# Patient Record
Sex: Male | Born: 1945 | Race: White | Hispanic: No | Marital: Married | Smoking: Former smoker
Health system: Southern US, Community
[De-identification: ages and names within clinical notes are randomized; demographics above are authoritative.]

## PROBLEM LIST (undated history)

## (undated) DIAGNOSIS — K219 Gastro-esophageal reflux disease without esophagitis: Secondary | ICD-10-CM

## (undated) DIAGNOSIS — K648 Other hemorrhoids: Secondary | ICD-10-CM

## (undated) DIAGNOSIS — K5790 Diverticulosis of intestine, part unspecified, without perforation or abscess without bleeding: Secondary | ICD-10-CM

## (undated) DIAGNOSIS — G500 Trigeminal neuralgia: Secondary | ICD-10-CM

## (undated) DIAGNOSIS — G4730 Sleep apnea, unspecified: Secondary | ICD-10-CM

## (undated) DIAGNOSIS — I10 Essential (primary) hypertension: Secondary | ICD-10-CM

## (undated) DIAGNOSIS — M1990 Unspecified osteoarthritis, unspecified site: Secondary | ICD-10-CM

## (undated) DIAGNOSIS — I252 Old myocardial infarction: Secondary | ICD-10-CM

## (undated) DIAGNOSIS — F32A Depression, unspecified: Secondary | ICD-10-CM

## (undated) DIAGNOSIS — F329 Major depressive disorder, single episode, unspecified: Secondary | ICD-10-CM

## (undated) DIAGNOSIS — C801 Malignant (primary) neoplasm, unspecified: Secondary | ICD-10-CM

## (undated) DIAGNOSIS — E785 Hyperlipidemia, unspecified: Secondary | ICD-10-CM

## (undated) HISTORY — DX: Other hemorrhoids: K64.8

## (undated) HISTORY — DX: Sleep apnea, unspecified: G47.30

## (undated) HISTORY — PX: JOINT REPLACEMENT: SHX530

## (undated) HISTORY — DX: Depression, unspecified: F32.A

## (undated) HISTORY — PX: KNEE SURGERY: SHX244

## (undated) HISTORY — PX: BUNIONECTOMY: SHX129

## (undated) HISTORY — DX: Hyperlipidemia, unspecified: E78.5

## (undated) HISTORY — DX: Major depressive disorder, single episode, unspecified: F32.9

## (undated) HISTORY — PX: EXTRACORPOREAL SHOCK WAVE LITHOTRIPSY: SHX1557

## (undated) HISTORY — DX: Trigeminal neuralgia: G50.0

## (undated) HISTORY — PX: VASECTOMY: SHX75

## (undated) HISTORY — DX: Diverticulosis of intestine, part unspecified, without perforation or abscess without bleeding: K57.90

## (undated) HISTORY — DX: Essential (primary) hypertension: I10

## (undated) HISTORY — DX: Unspecified osteoarthritis, unspecified site: M19.90

## (undated) HISTORY — PX: CORONARY STENT PLACEMENT: SHX1402

## (undated) HISTORY — PX: TOTAL SHOULDER REPLACEMENT: SUR1217

## (undated) HISTORY — PX: WRIST SURGERY: SHX841

## (undated) HISTORY — DX: Gastro-esophageal reflux disease without esophagitis: K21.9

## (undated) HISTORY — DX: Old myocardial infarction: I25.2

## (undated) HISTORY — PX: HERNIA REPAIR: SHX51

## (undated) HISTORY — PX: UPPER GASTROINTESTINAL ENDOSCOPY: SHX188

## (undated) HISTORY — PX: TONSILLECTOMY: SUR1361

---

## 2001-10-18 HISTORY — PX: UPPER GI ENDOSCOPY: SHX6162

## 2005-03-17 ENCOUNTER — Ambulatory Visit: Payer: Self-pay | Admitting: Podiatry

## 2008-12-14 ENCOUNTER — Ambulatory Visit: Payer: Self-pay | Admitting: Gastroenterology

## 2009-10-07 ENCOUNTER — Emergency Department: Payer: Self-pay | Admitting: Emergency Medicine

## 2009-10-16 ENCOUNTER — Ambulatory Visit: Payer: Self-pay | Admitting: Urology

## 2009-10-17 ENCOUNTER — Ambulatory Visit: Payer: Self-pay | Admitting: Urology

## 2009-10-29 ENCOUNTER — Ambulatory Visit: Payer: Self-pay | Admitting: Urology

## 2010-11-10 ENCOUNTER — Ambulatory Visit: Payer: Self-pay | Admitting: Family Medicine

## 2011-01-19 ENCOUNTER — Emergency Department: Payer: Self-pay | Admitting: Emergency Medicine

## 2011-05-26 ENCOUNTER — Ambulatory Visit: Payer: Self-pay | Admitting: Family Medicine

## 2011-10-14 ENCOUNTER — Ambulatory Visit: Payer: Self-pay | Admitting: Gastroenterology

## 2012-02-16 ENCOUNTER — Ambulatory Visit: Payer: Self-pay | Admitting: Urology

## 2012-12-12 ENCOUNTER — Ambulatory Visit: Payer: Self-pay | Admitting: Family Medicine

## 2012-12-12 LAB — CREATININE, SERUM
Creatinine: 1.18 mg/dL (ref 0.60–1.30)
EGFR (African American): >60
EGFR (Non-African Amer.): >60

## 2012-12-15 ENCOUNTER — Ambulatory Visit: Payer: Self-pay | Admitting: Family Medicine

## 2013-02-01 ENCOUNTER — Ambulatory Visit: Payer: Self-pay | Admitting: Vascular Surgery

## 2014-01-16 ENCOUNTER — Ambulatory Visit: Payer: Self-pay | Admitting: Urology

## 2014-01-16 DIAGNOSIS — N23 Unspecified renal colic: Secondary | ICD-10-CM | POA: Insufficient documentation

## 2014-01-16 DIAGNOSIS — N400 Enlarged prostate without lower urinary tract symptoms: Secondary | ICD-10-CM | POA: Insufficient documentation

## 2014-01-30 ENCOUNTER — Ambulatory Visit: Payer: Self-pay | Admitting: Family Medicine

## 2014-08-13 ENCOUNTER — Emergency Department: Payer: Self-pay | Admitting: Emergency Medicine

## 2014-08-13 DIAGNOSIS — I219 Acute myocardial infarction, unspecified: Secondary | ICD-10-CM | POA: Insufficient documentation

## 2014-08-21 DIAGNOSIS — E785 Hyperlipidemia, unspecified: Secondary | ICD-10-CM | POA: Insufficient documentation

## 2014-08-21 DIAGNOSIS — E782 Mixed hyperlipidemia: Secondary | ICD-10-CM | POA: Insufficient documentation

## 2014-08-21 DIAGNOSIS — I10 Essential (primary) hypertension: Secondary | ICD-10-CM | POA: Insufficient documentation

## 2014-09-28 LAB — BASIC METABOLIC PANEL
BUN: 19 mg/dL (ref 4–21)
Creatinine: 1 mg/dL (ref 0.6–1.3)
Glucose: 96 mg/dL
Potassium: 4.5 mmol/L (ref 3.4–5.3)
Sodium: 141 mmol/L (ref 137–147)

## 2014-09-28 LAB — CBC AND DIFFERENTIAL
HCT: 14 % — AB (ref 41–53)
HEMOGLOBIN: 41.9 g/dL — AB (ref 13.5–17.5)
NEUTROS ABS: 3 /uL
PLATELETS: 293 10*3/uL (ref 150–399)
WBC: 5.4 10^3/mL

## 2014-09-28 LAB — TSH: TSH: 2.03 u[IU]/mL (ref 0.41–5.90)

## 2014-09-28 LAB — HEPATIC FUNCTION PANEL
ALT: 23 U/L (ref 10–40)
AST: 19 U/L (ref 14–40)
Alkaline Phosphatase: 85 U/L (ref 25–125)

## 2014-09-28 LAB — LIPID PANEL
CHOLESTEROL: 98 mg/dL (ref 0–200)
HDL: 36 mg/dL (ref 35–70)
LDL Cholesterol: 45 mg/dL
LDL/HDL RATIO: 1.3
TRIGLYCERIDES: 87 mg/dL (ref 40–160)

## 2014-10-16 DIAGNOSIS — I2510 Atherosclerotic heart disease of native coronary artery without angina pectoris: Secondary | ICD-10-CM | POA: Insufficient documentation

## 2014-10-19 DIAGNOSIS — I219 Acute myocardial infarction, unspecified: Secondary | ICD-10-CM

## 2014-10-19 HISTORY — DX: Acute myocardial infarction, unspecified: I21.9

## 2014-10-22 ENCOUNTER — Ambulatory Visit: Payer: Self-pay | Admitting: Podiatry

## 2014-10-29 ENCOUNTER — Encounter: Payer: Self-pay | Admitting: Podiatry

## 2014-10-29 ENCOUNTER — Ambulatory Visit (INDEPENDENT_AMBULATORY_CARE_PROVIDER_SITE_OTHER): Payer: Commercial Managed Care - HMO

## 2014-10-29 ENCOUNTER — Ambulatory Visit (INDEPENDENT_AMBULATORY_CARE_PROVIDER_SITE_OTHER): Payer: Commercial Managed Care - HMO | Admitting: Podiatry

## 2014-10-29 VITALS — BP 119/72 | HR 71 | Resp 16 | Ht 71.0 in | Wt 165.0 lb

## 2014-10-29 DIAGNOSIS — M779 Enthesopathy, unspecified: Secondary | ICD-10-CM

## 2014-10-29 DIAGNOSIS — M2042 Other hammer toe(s) (acquired), left foot: Secondary | ICD-10-CM

## 2014-10-29 NOTE — Progress Notes (Signed)
   Subjective:    Patient ID: Timothy Singleton, male    DOB: August 25, 1946, 69 y.o.   MRN: [id number]  HPI Comments: The pain started a little bit after thanksgiving, the pain comes and goes.  It seems to show up after a lot of activity . Left foot ball , 2/3 met , tender if you mash on it   Foot Pain      Review of Systems  All other systems reviewed and are negative.      Objective:   Physical Exam: I have reviewed his past medical history medications allergies surgery social history and review of systems. Pulses are strongly palpable bilateral. Neurologic sensorium is intact per Semmes-Weinstein monofilament. Deep tendon reflexes are intact bilateral muscle strength +5 over 5 dorsiflexion plantar flexors and inverters everters all just musculature is intact. Orthopedic evaluation demonstrates all joints is the angle full range of motion without crepitation. Pain on palpation and in range of motion at the second metatarsophalangeal joint of the left foot slight enlargement of the joint radiographs confirm medial deviation of the second toe deformity hallux.        Assessment & Plan:  Assessment: Capsulitis metatarsophalangeal joint associated with hammertoe left foot.  Plan: Injected around the joint Kenalog and local anesthetic.

## 2014-12-10 ENCOUNTER — Ambulatory Visit (INDEPENDENT_AMBULATORY_CARE_PROVIDER_SITE_OTHER): Payer: Commercial Managed Care - HMO | Admitting: Podiatry

## 2014-12-10 ENCOUNTER — Encounter: Payer: Self-pay | Admitting: Podiatry

## 2014-12-10 VITALS — BP 106/68 | HR 67 | Resp 12

## 2014-12-10 DIAGNOSIS — M2042 Other hammer toe(s) (acquired), left foot: Secondary | ICD-10-CM

## 2014-12-10 DIAGNOSIS — M779 Enthesopathy, unspecified: Secondary | ICD-10-CM

## 2014-12-10 NOTE — Progress Notes (Signed)
He presents today for follow-up of capsulitis to second metatarsophalangeal joint of his left foot. He states that most recently he has been wearing a silicone hammertoe device that holds the toenail and has padding beneath the second metatarsal phalangeal joint. He states that this seems to be working much better  Objective: Pulses are palpable left foot. He still demonstrates mild hammertoe deformity with medial deviation of the second toe towards the hallux. This is indicative of dislocation syndrome however much decrease in edema and erythema surrounding the second metatarsophalangeal joint plantarly at the inflammation resolves.  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  Plan: I encouraged him to continue the use of his silicone hammertoe device and to notify me should his condition worsen.

## 2015-02-14 DIAGNOSIS — I6523 Occlusion and stenosis of bilateral carotid arteries: Secondary | ICD-10-CM | POA: Insufficient documentation

## 2015-03-20 ENCOUNTER — Telehealth: Payer: Self-pay

## 2015-03-20 NOTE — Telephone Encounter (Signed)
There is no history of Tramadol use that I cn see now--what is it for and why Tramadol?

## 2015-03-20 NOTE — Telephone Encounter (Signed)
Patient sent email requesting Tramodol 50mg   But he does not have this on his medication list.

## 2015-03-26 ENCOUNTER — Other Ambulatory Visit: Payer: Self-pay

## 2015-03-26 MED ORDER — TRAMADOL HCL 50 MG PO TABS: ORAL_TABLET | ORAL | Status: DC

## 2015-03-26 NOTE — Telephone Encounter (Signed)
He can have Tramadol 50mg  1-2 q 4 prn ,#200,5rf.

## 2015-04-29 ENCOUNTER — Other Ambulatory Visit: Payer: Self-pay | Admitting: Family Medicine

## 2015-04-29 ENCOUNTER — Other Ambulatory Visit: Payer: Self-pay

## 2015-04-29 MED ORDER — TAMSULOSIN HCL 0.4 MG PO CAPS: 0.4000 mg | ORAL_CAPSULE | Freq: Every day | ORAL | Status: DC

## 2015-07-10 ENCOUNTER — Encounter: Payer: Self-pay | Admitting: Family Medicine

## 2015-07-22 DIAGNOSIS — F329 Major depressive disorder, single episode, unspecified: Secondary | ICD-10-CM | POA: Insufficient documentation

## 2015-07-22 DIAGNOSIS — J3089 Other allergic rhinitis: Secondary | ICD-10-CM

## 2015-07-22 DIAGNOSIS — I2510 Atherosclerotic heart disease of native coronary artery without angina pectoris: Secondary | ICD-10-CM

## 2015-07-22 DIAGNOSIS — J309 Allergic rhinitis, unspecified: Secondary | ICD-10-CM | POA: Insufficient documentation

## 2015-07-22 DIAGNOSIS — K589 Irritable bowel syndrome without diarrhea: Secondary | ICD-10-CM

## 2015-07-22 DIAGNOSIS — K219 Gastro-esophageal reflux disease without esophagitis: Secondary | ICD-10-CM

## 2015-07-22 DIAGNOSIS — G459 Transient cerebral ischemic attack, unspecified: Secondary | ICD-10-CM | POA: Insufficient documentation

## 2015-07-22 DIAGNOSIS — G458 Other transient cerebral ischemic attacks and related syndromes: Secondary | ICD-10-CM

## 2015-07-22 DIAGNOSIS — L570 Actinic keratosis: Secondary | ICD-10-CM | POA: Insufficient documentation

## 2015-07-22 DIAGNOSIS — G500 Trigeminal neuralgia: Secondary | ICD-10-CM

## 2015-07-22 DIAGNOSIS — G4733 Obstructive sleep apnea (adult) (pediatric): Secondary | ICD-10-CM

## 2015-07-22 DIAGNOSIS — F32A Depression, unspecified: Secondary | ICD-10-CM | POA: Insufficient documentation

## 2015-07-23 ENCOUNTER — Encounter: Payer: Self-pay | Admitting: Family Medicine

## 2015-07-23 ENCOUNTER — Ambulatory Visit (INDEPENDENT_AMBULATORY_CARE_PROVIDER_SITE_OTHER): Payer: Commercial Managed Care - HMO | Admitting: Family Medicine

## 2015-07-23 VITALS — BP 112/72 | HR 68 | Temp 98.6°F | Resp 16 | Ht 71.5 in | Wt 165.0 lb

## 2015-07-23 DIAGNOSIS — Z0000 Encounter for general adult medical examination without abnormal findings: Secondary | ICD-10-CM | POA: Diagnosis not present

## 2015-07-23 DIAGNOSIS — L570 Actinic keratosis: Secondary | ICD-10-CM | POA: Diagnosis not present

## 2015-07-23 NOTE — Progress Notes (Signed)
Patient ID: Timothy Singleton, male   DOB: 1945/10/21, 69 y.o.   MRN: [id number] Patient: Timothy Singleton, Male    DOB: May 24, 1946, 69 y.o.   MRN: [id number] Visit Date: 07/23/2015  Today's Provider: Wilhemena Durie, MD   Chief Complaint  Patient presents with  . Annual Exam   Subjective:   Timothy Singleton is a 69 y.o. male who presents today for his Subsequent Annual Wellness Visit. He feels well. He reports exercising 3 times weekly. He reports he is sleeping well.  Review of Systems  Constitutional: Negative.   HENT: Negative.   Eyes: Negative.   Respiratory: Negative.   Cardiovascular: Negative.   Gastrointestinal: Negative.   Endocrine: Negative.   Genitourinary: Negative.   Musculoskeletal: Positive for arthralgias and neck stiffness. Negative for myalgias, back pain, joint swelling, gait problem and neck pain.  Allergic/Immunologic: Negative.   Neurological: Negative.   Hematological: Negative.   Psychiatric/Behavioral: Negative.     Patient Active Problem List   Diagnosis Date Noted  . Clinical depression 07/22/2015  . Acid reflux 07/22/2015  . IBS (irritable bowel syndrome) 07/22/2015  . AK (actinic keratosis) 07/22/2015  . TIA (transient ischemic attack) 07/22/2015  . OSA (obstructive sleep apnea) 07/22/2015  . Idiopathic trigeminal neuralgia 07/22/2015  . CAD in native artery 07/22/2015  . GERD (gastroesophageal reflux disease) 07/22/2015  . Allergic rhinitis 07/22/2015  . Arteriosclerosis of coronary artery 10/16/2014  . Essential (primary) hypertension 08/21/2014  . Combined fat and carbohydrate induced hyperlipemia 08/21/2014  . Heart attack ([place]) 08/13/2014  . Benign prostatic hypertrophy without urinary obstruction 01/16/2014  . Renal colic 96/22/2979    Social History   Social History  . Marital Status: Married    Spouse Name: N/A  . Number of Children: N/A  . Years of Education: N/A   Occupational History  . Not on file.   Social  History Main Topics  . Smoking status: Former Research scientist (life sciences)  . Smokeless tobacco: Not on file     Comment: quit 40 years ago   . Alcohol Use: 0.0 oz/week    0 Standard drinks or equivalent per week     Comment: 2-4 times per month  . Drug Use: Not on file  . Sexual Activity: Not on file   Other Topics Concern  . Not on file   Social History Narrative    Past Surgical History  Procedure Laterality Date  . Hernia repair      inguinal-right  . Vasectomy    . Knee surgery Right   . Tonsillectomy    . Bunionectomy    . Wrist surgery    . Upper gi endoscopy  10/18/01    hiatus hernia    His family history includes Breast cancer in his sister; Cancer in his mother; Dementia in his mother; Heart disease in his father; Hypertension in his brother and father; Melanoma in his maternal grandmother; Parkinson's disease in his brother; Stroke in his father.    Outpatient Prescriptions Prior to Visit  Medication Sig Dispense Refill  . aspirin 81 MG tablet Take 81 mg by mouth daily.    Marland Kitchen atorvastatin (LIPITOR) 80 MG tablet     . lisinopril (PRINIVIL,ZESTRIL) 10 MG tablet     . sertraline (ZOLOFT) 100 MG tablet Take by mouth.    . tamsulosin (FLOMAX) 0.4 MG CAPS capsule Take 1 capsule (0.4 mg total) by mouth daily. 30 capsule 2  . ticagrelor (BRILINTA) 90 MG TABS tablet Take by mouth.    Marland Kitchen  metoprolol succinate (TOPROL-XL) 25 MG 24 hr tablet     . traMADol (ULTRAM) 50 MG tablet 1 to 2 tablets every 4 hours as needed for pain 200 tablet 5   No facility-administered medications prior to visit.    No Known Allergies  Patient Care Team: Jerrol Banana., MD as PCP - General (Family Medicine)  Objective:   Vitals:  Filed Vitals:   07/23/15 1050  BP: 112/72  Pulse: 68  Temp: 98.6 F (37 C)  TempSrc: Oral  Resp: 16  Height: 5' 11.5" (1.816 m)  Weight: 165 lb (74.844 kg)    Physical Exam  Constitutional: He is oriented to person, place, and time. He appears well-developed and  well-nourished.  HENT:  Head: Normocephalic and atraumatic.  Right Ear: External ear normal.  Left Ear: External ear normal.  Nose: Nose normal.  Mouth/Throat: Oropharynx is clear and moist.  Eyes: Conjunctivae and EOM are normal. Pupils are equal, round, and reactive to light.  Neck: Normal range of motion. Neck supple.  Cardiovascular: Normal rate, regular rhythm, normal heart sounds and intact distal pulses.   Pulmonary/Chest: Effort normal and breath sounds normal.  Abdominal: Soft. Bowel sounds are normal.  Genitourinary: Rectum normal, prostate normal and penis normal.  Musculoskeletal: Normal range of motion.  Neurological: He is alert and oriented to person, place, and time.  Skin: Skin is warm and dry.  Very fair skin. Refer to dermatology  Psychiatric: He has a normal mood and affect. His behavior is normal. Judgment and thought content normal.    Activities of Daily Living In your present state of health, do you have any difficulty performing the following activities: 07/23/2015  Hearing? N  Vision? N  Difficulty concentrating or making decisions? N  Walking or climbing stairs? N  Dressing or bathing? N  Doing errands, shopping? N    Fall Risk Assessment Fall Risk  07/23/2015  Falls in the past year? No     Depression Screen PHQ 2/9 Scores 07/23/2015  PHQ - 2 Score 0    Cognitive Testing - 6-CIT    Year: 0 4 points  Month: 0 3 points  Memorize "T[place], [place], [address] [place]"  Time (within 1 hour:) 0 3 points  Count backwards from 20: 0 2 4 points  Name months of year: 0 2 4 points  Repeat Address: 0 2 4 6 8 10  points   Total Score:1 /28  Interpretation : Normal (0-7) Abnormal (8-28)    Assessment & Plan:     Annual Wellness Visit  Reviewed patient's Family Medical History Reviewed and updated list of patient's medical providers Assessment of cognitive impairment was done Assessed patient's functional ability Established a written schedule  for health screening [place] Completed and Reviewed  Exercise Activities and Dietary recommendations Goals    None      Immunization History  Administered Date(s) Administered  . Pneumococcal Conjugate-13 07/11/2014  . Td 09/20/2003  . Zoster 01/28/2010    Health Maintenance  Topic Date Due  . Hepatitis C Screening  02-May-1946  . COLONOSCOPY  05/21/1995  . TETANUS/TDAP  09/19/2013  . INFLUENZA VACCINE  05/20/2015  . PNA vac Low Risk Adult (2 of 2 - PPSV23) 07/12/2015  . ZOSTAVAX  Completed      Discussed health benefits of physical activity, and encouraged him to engage in regular exercise appropriate for his age and condition.    Miguel Aschoff MD [facility]  07/23/2015 10:55 AM  ------------------------------------------------------------------------------------------------------------

## 2015-07-26 ENCOUNTER — Encounter: Payer: Self-pay | Admitting: Family Medicine

## 2015-08-28 ENCOUNTER — Ambulatory Visit: Payer: Self-pay | Admitting: Family Medicine

## 2015-09-10 DIAGNOSIS — M19019 Primary osteoarthritis, unspecified shoulder: Secondary | ICD-10-CM | POA: Insufficient documentation

## 2015-09-10 LAB — BASIC METABOLIC PANEL
BUN: 14 mg/dL (ref 4–21)
Creatinine: 0.9 mg/dL (ref 0.6–1.3)
Glucose: 123 mg/dL
Potassium: 4.8 mmol/L (ref 3.4–5.3)
SODIUM: 143 mmol/L (ref 137–147)

## 2015-09-10 LAB — CBC AND DIFFERENTIAL
HEMATOCRIT: 35 % — AB (ref 41–53)
HEMOGLOBIN: 11.7 g/dL — AB (ref 13.5–17.5)
NEUTROS ABS: 2 /uL
Platelets: 215 10*3/uL (ref 150–399)
WBC: 7.3 10^3/mL

## 2015-10-24 DIAGNOSIS — M25611 Stiffness of right shoulder, not elsewhere classified: Secondary | ICD-10-CM | POA: Diagnosis not present

## 2015-10-24 DIAGNOSIS — M25511 Pain in right shoulder: Secondary | ICD-10-CM | POA: Diagnosis not present

## 2015-10-29 DIAGNOSIS — M25611 Stiffness of right shoulder, not elsewhere classified: Secondary | ICD-10-CM | POA: Diagnosis not present

## 2015-10-29 DIAGNOSIS — M25511 Pain in right shoulder: Secondary | ICD-10-CM | POA: Diagnosis not present

## 2015-11-05 DIAGNOSIS — M25611 Stiffness of right shoulder, not elsewhere classified: Secondary | ICD-10-CM | POA: Diagnosis not present

## 2015-11-05 DIAGNOSIS — M25511 Pain in right shoulder: Secondary | ICD-10-CM | POA: Diagnosis not present

## 2015-11-06 DIAGNOSIS — M25561 Pain in right knee: Secondary | ICD-10-CM | POA: Diagnosis not present

## 2015-11-08 DIAGNOSIS — M25511 Pain in right shoulder: Secondary | ICD-10-CM | POA: Diagnosis not present

## 2015-11-08 DIAGNOSIS — M25611 Stiffness of right shoulder, not elsewhere classified: Secondary | ICD-10-CM | POA: Diagnosis not present

## 2015-11-12 DIAGNOSIS — M25611 Stiffness of right shoulder, not elsewhere classified: Secondary | ICD-10-CM | POA: Diagnosis not present

## 2015-11-12 DIAGNOSIS — M25511 Pain in right shoulder: Secondary | ICD-10-CM | POA: Diagnosis not present

## 2015-11-14 DIAGNOSIS — M25511 Pain in right shoulder: Secondary | ICD-10-CM | POA: Diagnosis not present

## 2015-11-14 DIAGNOSIS — M25611 Stiffness of right shoulder, not elsewhere classified: Secondary | ICD-10-CM | POA: Diagnosis not present

## 2015-11-19 DIAGNOSIS — M25611 Stiffness of right shoulder, not elsewhere classified: Secondary | ICD-10-CM | POA: Diagnosis not present

## 2015-11-19 DIAGNOSIS — M25511 Pain in right shoulder: Secondary | ICD-10-CM | POA: Diagnosis not present

## 2015-11-21 DIAGNOSIS — M25511 Pain in right shoulder: Secondary | ICD-10-CM | POA: Diagnosis not present

## 2015-11-21 DIAGNOSIS — M25611 Stiffness of right shoulder, not elsewhere classified: Secondary | ICD-10-CM | POA: Diagnosis not present

## 2015-11-25 DIAGNOSIS — E782 Mixed hyperlipidemia: Secondary | ICD-10-CM | POA: Diagnosis not present

## 2015-11-25 DIAGNOSIS — I10 Essential (primary) hypertension: Secondary | ICD-10-CM | POA: Diagnosis not present

## 2015-11-25 DIAGNOSIS — I2510 Atherosclerotic heart disease of native coronary artery without angina pectoris: Secondary | ICD-10-CM | POA: Diagnosis not present

## 2015-11-26 DIAGNOSIS — M25611 Stiffness of right shoulder, not elsewhere classified: Secondary | ICD-10-CM | POA: Diagnosis not present

## 2015-11-26 DIAGNOSIS — M25511 Pain in right shoulder: Secondary | ICD-10-CM | POA: Diagnosis not present

## 2015-11-28 DIAGNOSIS — M25511 Pain in right shoulder: Secondary | ICD-10-CM | POA: Diagnosis not present

## 2015-11-28 DIAGNOSIS — M25611 Stiffness of right shoulder, not elsewhere classified: Secondary | ICD-10-CM | POA: Diagnosis not present

## 2015-11-29 DIAGNOSIS — H01021 Squamous blepharitis right upper eyelid: Secondary | ICD-10-CM | POA: Diagnosis not present

## 2015-11-29 DIAGNOSIS — H16221 Keratoconjunctivitis sicca, not specified as Sjogren's, right eye: Secondary | ICD-10-CM | POA: Diagnosis not present

## 2015-12-10 ENCOUNTER — Ambulatory Visit (INDEPENDENT_AMBULATORY_CARE_PROVIDER_SITE_OTHER): Payer: PPO | Admitting: Family Medicine

## 2015-12-10 VITALS — BP 110/60 | HR 64 | Temp 97.9°F | Resp 16 | Ht 71.0 in | Wt 162.0 lb

## 2015-12-10 DIAGNOSIS — G4733 Obstructive sleep apnea (adult) (pediatric): Secondary | ICD-10-CM

## 2015-12-10 DIAGNOSIS — Z01818 Encounter for other preprocedural examination: Secondary | ICD-10-CM

## 2015-12-10 NOTE — Progress Notes (Signed)
Patient ID: Timothy Singleton, male   DOB: 10-Mar-1946, 70 y.o.   MRN: [id number]   Zayquan Ehrler  MRN: [id number] DOB: 01-18-1946  Subjective:  HPI   1. Pre-op evaluation The patient is a 70 year old male who presents for surgical clearance.  He is scheduled for December 24, 2015 at [facility] to have Left shoulder replacement by Dr. Ivin Booty. He has had anesthesia before and reports no adverse effects.  He is currently on Aspirin 81 mg and has plans to discontinue 1 week prior to the surgery.  He has history of stent placement about 1 year ago, without current complications.  He has no history of any diabetes, lung disease bleeding or clotting disorder. He is a former smoker who quit in 1976.  He drinks alcoholic beverages on average 2 per week.  He has been instructed not to have any alcoholic beverages within 2 days of the surgery. His post op care is planned and he will be going home and will be under the care of his family.  Patient Active Problem List   Diagnosis Date Noted  . Clinical depression 07/22/2015  . Acid reflux 07/22/2015  . IBS (irritable bowel syndrome) 07/22/2015  . AK (actinic keratosis) 07/22/2015  . TIA (transient ischemic attack) 07/22/2015  . OSA (obstructive sleep apnea) 07/22/2015  . Idiopathic trigeminal neuralgia 07/22/2015  . CAD in native artery 07/22/2015  . GERD (gastroesophageal reflux disease) 07/22/2015  . Allergic rhinitis 07/22/2015  . Arteriosclerosis of coronary artery 10/16/2014  . Essential (primary) hypertension 08/21/2014  . Combined fat and carbohydrate induced hyperlipemia 08/21/2014  . Heart attack ([place]) 08/13/2014  . Benign prostatic hypertrophy without urinary obstruction 01/16/2014  . Renal colic 0000000    Past Medical History  Diagnosis Date  . Hypertension   . Past heart attack   . Depression     Social History   Social History  . Marital Status: Married    Spouse Name: N/A  . Number of Children: N/A  .  Years of Education: N/A   Occupational History  . Not on file.   Social History Main Topics  . Smoking status: Former Research scientist (life sciences)  . Smokeless tobacco: Not on file     Comment: quit 40 years ago   . Alcohol Use: 0.0 oz/week    0 Standard drinks or equivalent per week     Comment: 2-4 times per month  . Drug Use: Not on file  . Sexual Activity: Not on file   Other Topics Concern  . Not on file   Social History Narrative    Outpatient Prescriptions Prior to Visit  Medication Sig Dispense Refill  . aspirin 81 MG tablet Take 81 mg by mouth daily.    Marland Kitchen atorvastatin (LIPITOR) 80 MG tablet     . lisinopril (PRINIVIL,ZESTRIL) 10 MG tablet     . sertraline (ZOLOFT) 100 MG tablet Take 100 mg by mouth.     . tamsulosin (FLOMAX) 0.4 MG CAPS capsule Take 1 capsule (0.4 mg total) by mouth daily. 30 capsule 2  . ticagrelor (BRILINTA) 90 MG TABS tablet Take by mouth.     No facility-administered medications prior to visit.   Outpatient Encounter Prescriptions as of 12/10/2015  Medication Sig Note  . aspirin 81 MG tablet Take 81 mg by mouth daily.   Marland Kitchen atorvastatin (LIPITOR) 80 MG tablet  10/29/2014: Received from: External Pharmacy  . lisinopril (PRINIVIL,ZESTRIL) 10 MG tablet  10/29/2014: Received from: External Pharmacy  . sertraline (  ZOLOFT) 100 MG tablet Take 100 mg by mouth.  10/29/2014: Received from: [facility]  . tamsulosin (FLOMAX) 0.4 MG CAPS capsule Take 1 capsule (0.4 mg total) by mouth daily.   . [DISCONTINUED] ticagrelor (BRILINTA) 90 MG TABS tablet Take by mouth. 10/29/2014: Received from: [facility]   No facility-administered encounter medications on file as of 12/10/2015.   No Known Allergies  Review of Systems  Constitutional: Negative.   HENT: Negative.   Eyes: Negative.   Respiratory: Negative.   Cardiovascular: Negative.   Gastrointestinal: Negative.   Genitourinary: Negative.   Musculoskeletal: Positive for joint pain. Negative for  myalgias, back pain, falls and neck pain.  Skin: Negative.   Neurological: Negative.   Endo/Heme/Allergies: Negative.   Psychiatric/Behavioral: Negative.    Objective:  BP 110/60 mmHg  Pulse 64  Temp(Src) 97.9 F (36.6 C) (Oral)  Resp 16  Ht 5\' 11"  (1.803 m)  Wt 162 lb (73.483 kg)  BMI 22.60 kg/m2  Physical Exam  Constitutional: He is oriented to person, place, and time and well-developed, well-nourished, and in no distress.  HENT:  Head: Normocephalic and atraumatic.  Right Ear: External ear normal.  Left Ear: External ear normal.  Nose: Nose normal.  Mouth/Throat: Oropharynx is clear and moist.  Eyes: Conjunctivae and EOM are normal. Pupils are equal, round, and reactive to light.  Neck: Normal range of motion. Neck supple.  Cardiovascular: Normal rate, regular rhythm, normal heart sounds and intact distal pulses.   Pulmonary/Chest: Effort normal and breath sounds normal.  Abdominal: Soft. Bowel sounds are normal.  Musculoskeletal: Normal range of motion.  Neurological: He is alert and oriented to person, place, and time. He has normal reflexes. Gait normal. GCS score is 15.  Skin: Skin is warm and dry.  Very fair skin.  Psychiatric: Mood, memory, affect and judgment normal.    Assessment and Plan :  Pre-op evaluation - Plan: EKG 12-Lead Cleared for surgery for shoulder. - EKG 12-Lead Cardiac Clearance through Dr. Nehemiah Massed. 2. OSA (obstructive sleep apnea) Scored 11 on Epworth sleepiness scale today. Recheck patient in 2-3 months after surgery to see how he is doing after his shoulder pain has resolved. Maybe he will be sleeping better then. If not will restart CPAP 3. CAD 4. Hypertension 5. Hyperlipidemia I have done the exam and reviewed the above chart and it is accurate to the best of my knowledge.   Patient was seen and examined by Dr. Miguel Aschoff, and noted scribed by Webb Laws, [place] MD [facility] 12/10/2015 11:44 AM

## 2015-12-13 DIAGNOSIS — M67412 Ganglion, left shoulder: Secondary | ICD-10-CM | POA: Diagnosis not present

## 2015-12-13 DIAGNOSIS — M25412 Effusion, left shoulder: Secondary | ICD-10-CM | POA: Diagnosis not present

## 2015-12-13 DIAGNOSIS — I252 Old myocardial infarction: Secondary | ICD-10-CM | POA: Diagnosis not present

## 2015-12-13 DIAGNOSIS — Z01818 Encounter for other preprocedural examination: Secondary | ICD-10-CM | POA: Diagnosis not present

## 2015-12-13 DIAGNOSIS — Z87891 Personal history of nicotine dependence: Secondary | ICD-10-CM | POA: Diagnosis not present

## 2015-12-13 DIAGNOSIS — Z01812 Encounter for preprocedural laboratory examination: Secondary | ICD-10-CM | POA: Diagnosis not present

## 2015-12-13 DIAGNOSIS — I10 Essential (primary) hypertension: Secondary | ICD-10-CM | POA: Diagnosis not present

## 2015-12-13 DIAGNOSIS — M19011 Primary osteoarthritis, right shoulder: Secondary | ICD-10-CM | POA: Diagnosis not present

## 2015-12-13 DIAGNOSIS — Z951 Presence of aortocoronary bypass graft: Secondary | ICD-10-CM | POA: Diagnosis not present

## 2015-12-13 DIAGNOSIS — M19012 Primary osteoarthritis, left shoulder: Secondary | ICD-10-CM | POA: Diagnosis not present

## 2015-12-17 ENCOUNTER — Ambulatory Visit: Payer: Self-pay | Admitting: Family Medicine

## 2015-12-24 DIAGNOSIS — N400 Enlarged prostate without lower urinary tract symptoms: Secondary | ICD-10-CM | POA: Diagnosis not present

## 2015-12-24 DIAGNOSIS — F329 Major depressive disorder, single episode, unspecified: Secondary | ICD-10-CM | POA: Diagnosis not present

## 2015-12-24 DIAGNOSIS — Z955 Presence of coronary angioplasty implant and graft: Secondary | ICD-10-CM | POA: Diagnosis not present

## 2015-12-24 DIAGNOSIS — M75102 Unspecified rotator cuff tear or rupture of left shoulder, not specified as traumatic: Secondary | ICD-10-CM | POA: Diagnosis not present

## 2015-12-24 DIAGNOSIS — I2510 Atherosclerotic heart disease of native coronary artery without angina pectoris: Secondary | ICD-10-CM | POA: Diagnosis not present

## 2015-12-24 DIAGNOSIS — Z87891 Personal history of nicotine dependence: Secondary | ICD-10-CM | POA: Diagnosis not present

## 2015-12-24 DIAGNOSIS — M1990 Unspecified osteoarthritis, unspecified site: Secondary | ICD-10-CM | POA: Diagnosis not present

## 2015-12-24 DIAGNOSIS — Z96612 Presence of left artificial shoulder joint: Secondary | ICD-10-CM | POA: Diagnosis not present

## 2015-12-24 DIAGNOSIS — K219 Gastro-esophageal reflux disease without esophagitis: Secondary | ICD-10-CM | POA: Diagnosis not present

## 2015-12-24 DIAGNOSIS — G629 Polyneuropathy, unspecified: Secondary | ICD-10-CM | POA: Diagnosis not present

## 2015-12-24 DIAGNOSIS — Z471 Aftercare following joint replacement surgery: Secondary | ICD-10-CM | POA: Diagnosis not present

## 2015-12-24 DIAGNOSIS — I10 Essential (primary) hypertension: Secondary | ICD-10-CM | POA: Diagnosis not present

## 2015-12-24 DIAGNOSIS — E785 Hyperlipidemia, unspecified: Secondary | ICD-10-CM | POA: Diagnosis not present

## 2015-12-24 DIAGNOSIS — I252 Old myocardial infarction: Secondary | ICD-10-CM | POA: Diagnosis not present

## 2015-12-24 DIAGNOSIS — M12812 Other specific arthropathies, not elsewhere classified, left shoulder: Secondary | ICD-10-CM | POA: Diagnosis not present

## 2015-12-24 DIAGNOSIS — G8918 Other acute postprocedural pain: Secondary | ICD-10-CM | POA: Diagnosis not present

## 2015-12-25 DIAGNOSIS — G8918 Other acute postprocedural pain: Secondary | ICD-10-CM | POA: Diagnosis not present

## 2015-12-25 DIAGNOSIS — M75102 Unspecified rotator cuff tear or rupture of left shoulder, not specified as traumatic: Secondary | ICD-10-CM | POA: Diagnosis not present

## 2016-01-07 ENCOUNTER — Encounter: Payer: Self-pay | Admitting: Family Medicine

## 2016-01-07 DIAGNOSIS — M25512 Pain in left shoulder: Secondary | ICD-10-CM | POA: Diagnosis not present

## 2016-01-08 ENCOUNTER — Other Ambulatory Visit: Payer: Self-pay | Admitting: Emergency Medicine

## 2016-01-08 MED ORDER — CARBAMAZEPINE ER 200 MG PO CP12
200.0000 mg | ORAL_CAPSULE | Freq: Two times a day (BID) | ORAL | Status: DC
Start: 2016-01-08 — End: 2016-10-28

## 2016-01-16 DIAGNOSIS — M25612 Stiffness of left shoulder, not elsewhere classified: Secondary | ICD-10-CM | POA: Diagnosis not present

## 2016-01-16 DIAGNOSIS — M25512 Pain in left shoulder: Secondary | ICD-10-CM | POA: Diagnosis not present

## 2016-01-21 ENCOUNTER — Telehealth: Payer: Self-pay

## 2016-01-21 ENCOUNTER — Encounter: Payer: Self-pay | Admitting: Family Medicine

## 2016-01-21 DIAGNOSIS — M25612 Stiffness of left shoulder, not elsewhere classified: Secondary | ICD-10-CM | POA: Diagnosis not present

## 2016-01-21 DIAGNOSIS — M25512 Pain in left shoulder: Secondary | ICD-10-CM | POA: Diagnosis not present

## 2016-01-21 MED ORDER — ATORVASTATIN CALCIUM 80 MG PO TABS: 80.0000 mg | ORAL_TABLET | Freq: Every day | ORAL | Status: DC

## 2016-01-21 MED ORDER — TRAMADOL HCL 50 MG PO TABS: 50.0000 mg | ORAL_TABLET | ORAL | Status: DC | PRN

## 2016-01-21 MED ORDER — TAMSULOSIN HCL 0.4 MG PO CAPS: 0.4000 mg | ORAL_CAPSULE | Freq: Every day | ORAL | Status: DC

## 2016-01-21 MED ORDER — SERTRALINE HCL 100 MG PO TABS: 100.0000 mg | ORAL_TABLET | Freq: Every day | ORAL | Status: DC

## 2016-01-21 NOTE — Telephone Encounter (Signed)
rx filled-aa

## 2016-01-21 NOTE — Telephone Encounter (Signed)
Tramadol 50 mg every 4 hours when necessary #100,1refill

## 2016-01-21 NOTE — Telephone Encounter (Signed)
I am  out of the Tramadol that the neurologist had me on for the first line of defense against the Trigeminal neuralgia. I can't find my old bottle so I'm not sure what the dosage was but I'd like to have that medicine to keep on hand.-aa

## 2016-01-23 DIAGNOSIS — M25612 Stiffness of left shoulder, not elsewhere classified: Secondary | ICD-10-CM | POA: Diagnosis not present

## 2016-01-23 DIAGNOSIS — M25512 Pain in left shoulder: Secondary | ICD-10-CM | POA: Diagnosis not present

## 2016-01-28 DIAGNOSIS — M25512 Pain in left shoulder: Secondary | ICD-10-CM | POA: Diagnosis not present

## 2016-01-28 DIAGNOSIS — M25612 Stiffness of left shoulder, not elsewhere classified: Secondary | ICD-10-CM | POA: Diagnosis not present

## 2016-01-30 DIAGNOSIS — M6281 Muscle weakness (generalized): Secondary | ICD-10-CM | POA: Diagnosis not present

## 2016-02-04 DIAGNOSIS — M25512 Pain in left shoulder: Secondary | ICD-10-CM | POA: Diagnosis not present

## 2016-02-04 DIAGNOSIS — M25612 Stiffness of left shoulder, not elsewhere classified: Secondary | ICD-10-CM | POA: Diagnosis not present

## 2016-02-06 DIAGNOSIS — M25512 Pain in left shoulder: Secondary | ICD-10-CM | POA: Diagnosis not present

## 2016-02-06 DIAGNOSIS — M25612 Stiffness of left shoulder, not elsewhere classified: Secondary | ICD-10-CM | POA: Diagnosis not present

## 2016-02-11 DIAGNOSIS — L570 Actinic keratosis: Secondary | ICD-10-CM | POA: Diagnosis not present

## 2016-02-11 DIAGNOSIS — L814 Other melanin hyperpigmentation: Secondary | ICD-10-CM | POA: Diagnosis not present

## 2016-02-11 DIAGNOSIS — D485 Neoplasm of uncertain behavior of skin: Secondary | ICD-10-CM | POA: Diagnosis not present

## 2016-02-11 DIAGNOSIS — D225 Melanocytic nevi of trunk: Secondary | ICD-10-CM | POA: Diagnosis not present

## 2016-02-11 DIAGNOSIS — M25612 Stiffness of left shoulder, not elsewhere classified: Secondary | ICD-10-CM | POA: Diagnosis not present

## 2016-02-11 DIAGNOSIS — D2272 Melanocytic nevi of left lower limb, including hip: Secondary | ICD-10-CM | POA: Diagnosis not present

## 2016-02-11 DIAGNOSIS — M25512 Pain in left shoulder: Secondary | ICD-10-CM | POA: Diagnosis not present

## 2016-02-11 DIAGNOSIS — C4371 Malignant melanoma of right lower limb, including hip: Secondary | ICD-10-CM | POA: Diagnosis not present

## 2016-02-11 DIAGNOSIS — X32XXXA Exposure to sunlight, initial encounter: Secondary | ICD-10-CM | POA: Diagnosis not present

## 2016-02-13 DIAGNOSIS — M25612 Stiffness of left shoulder, not elsewhere classified: Secondary | ICD-10-CM | POA: Diagnosis not present

## 2016-02-13 DIAGNOSIS — M25512 Pain in left shoulder: Secondary | ICD-10-CM | POA: Diagnosis not present

## 2016-02-17 DIAGNOSIS — D0371 Melanoma in situ of right lower limb, including hip: Secondary | ICD-10-CM | POA: Diagnosis not present

## 2016-02-17 DIAGNOSIS — C4371 Malignant melanoma of right lower limb, including hip: Secondary | ICD-10-CM | POA: Diagnosis not present

## 2016-02-18 DIAGNOSIS — M25612 Stiffness of left shoulder, not elsewhere classified: Secondary | ICD-10-CM | POA: Diagnosis not present

## 2016-02-18 DIAGNOSIS — M25512 Pain in left shoulder: Secondary | ICD-10-CM | POA: Diagnosis not present

## 2016-02-20 ENCOUNTER — Encounter: Payer: Self-pay | Admitting: Family Medicine

## 2016-02-20 ENCOUNTER — Telehealth: Payer: Self-pay

## 2016-02-20 DIAGNOSIS — M25612 Stiffness of left shoulder, not elsewhere classified: Secondary | ICD-10-CM | POA: Diagnosis not present

## 2016-02-20 DIAGNOSIS — M25512 Pain in left shoulder: Secondary | ICD-10-CM | POA: Diagnosis not present

## 2016-02-20 NOTE — Telephone Encounter (Signed)
Dick,        A couple of things. First, I saw Dr. Evorn Gong last week and he did an exam of my body and removed several spots. 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        I have an appointment scheduled with you for May 30th; we were going to talk about my not sleeping well at night and revisit the CPAP, which I don't use.        However, I am experiencing some tenderness in my left breast. I don't feel a lump and Dr. Evorn Gong said he didn't see any difference in my left and right breast but suggested that I see you.        I'm sending this note at Vicki's suggestion, in case there is something that you would like to do before my May 30th appointment.        Thanks,        Timothy Singleton

## 2016-02-25 DIAGNOSIS — M25612 Stiffness of left shoulder, not elsewhere classified: Secondary | ICD-10-CM | POA: Diagnosis not present

## 2016-02-25 DIAGNOSIS — M25512 Pain in left shoulder: Secondary | ICD-10-CM | POA: Diagnosis not present

## 2016-03-05 DIAGNOSIS — M25512 Pain in left shoulder: Secondary | ICD-10-CM | POA: Diagnosis not present

## 2016-03-05 DIAGNOSIS — M25612 Stiffness of left shoulder, not elsewhere classified: Secondary | ICD-10-CM | POA: Diagnosis not present

## 2016-03-10 DIAGNOSIS — I2510 Atherosclerotic heart disease of native coronary artery without angina pectoris: Secondary | ICD-10-CM | POA: Diagnosis not present

## 2016-03-10 DIAGNOSIS — I10 Essential (primary) hypertension: Secondary | ICD-10-CM | POA: Diagnosis not present

## 2016-03-10 DIAGNOSIS — Z01812 Encounter for preprocedural laboratory examination: Secondary | ICD-10-CM | POA: Diagnosis not present

## 2016-03-10 DIAGNOSIS — Z01818 Encounter for other preprocedural examination: Secondary | ICD-10-CM | POA: Diagnosis not present

## 2016-03-10 DIAGNOSIS — M1711 Unilateral primary osteoarthritis, right knee: Secondary | ICD-10-CM | POA: Diagnosis not present

## 2016-03-10 DIAGNOSIS — I252 Old myocardial infarction: Secondary | ICD-10-CM | POA: Diagnosis not present

## 2016-03-10 DIAGNOSIS — K219 Gastro-esophageal reflux disease without esophagitis: Secondary | ICD-10-CM | POA: Diagnosis not present

## 2016-03-10 DIAGNOSIS — Z87891 Personal history of nicotine dependence: Secondary | ICD-10-CM | POA: Diagnosis not present

## 2016-03-10 DIAGNOSIS — E785 Hyperlipidemia, unspecified: Secondary | ICD-10-CM | POA: Diagnosis not present

## 2016-03-10 DIAGNOSIS — M1990 Unspecified osteoarthritis, unspecified site: Secondary | ICD-10-CM | POA: Diagnosis not present

## 2016-03-10 DIAGNOSIS — Z01810 Encounter for preprocedural cardiovascular examination: Secondary | ICD-10-CM | POA: Diagnosis not present

## 2016-03-11 ENCOUNTER — Emergency Department
Admission: EM | Admit: 2016-03-11 | Discharge: 2016-03-11 | Disposition: A | Discharge status: Home / Self Care | Payer: PPO | Attending: Student | Admitting: Student

## 2016-03-11 ENCOUNTER — Emergency Department: Payer: PPO

## 2016-03-11 ENCOUNTER — Encounter: Payer: Self-pay | Admitting: *Deleted

## 2016-03-11 DIAGNOSIS — Z87891 Personal history of nicotine dependence: Secondary | ICD-10-CM | POA: Insufficient documentation

## 2016-03-11 DIAGNOSIS — R109 Unspecified abdominal pain: Secondary | ICD-10-CM | POA: Diagnosis not present

## 2016-03-11 DIAGNOSIS — F329 Major depressive disorder, single episode, unspecified: Secondary | ICD-10-CM | POA: Diagnosis not present

## 2016-03-11 DIAGNOSIS — Z85820 Personal history of malignant melanoma of skin: Secondary | ICD-10-CM | POA: Diagnosis not present

## 2016-03-11 DIAGNOSIS — Z955 Presence of coronary angioplasty implant and graft: Secondary | ICD-10-CM | POA: Insufficient documentation

## 2016-03-11 DIAGNOSIS — I2510 Atherosclerotic heart disease of native coronary artery without angina pectoris: Secondary | ICD-10-CM | POA: Insufficient documentation

## 2016-03-11 DIAGNOSIS — I10 Essential (primary) hypertension: Secondary | ICD-10-CM | POA: Insufficient documentation

## 2016-03-11 DIAGNOSIS — N132 Hydronephrosis with renal and ureteral calculous obstruction: Secondary | ICD-10-CM | POA: Diagnosis not present

## 2016-03-11 DIAGNOSIS — Z7982 Long term (current) use of aspirin: Secondary | ICD-10-CM | POA: Diagnosis not present

## 2016-03-11 DIAGNOSIS — Z79899 Other long term (current) drug therapy: Secondary | ICD-10-CM | POA: Insufficient documentation

## 2016-03-11 DIAGNOSIS — N1330 Unspecified hydronephrosis: Secondary | ICD-10-CM | POA: Diagnosis not present

## 2016-03-11 DIAGNOSIS — Z8673 Personal history of transient ischemic attack (TIA), and cerebral infarction without residual deficits: Secondary | ICD-10-CM | POA: Insufficient documentation

## 2016-03-11 DIAGNOSIS — N201 Calculus of ureter: Secondary | ICD-10-CM

## 2016-03-11 HISTORY — DX: Malignant (primary) neoplasm, unspecified: C80.1

## 2016-03-11 LAB — CBC
HEMATOCRIT: 40.4 % (ref 40.0–52.0)
Hemoglobin: 13.8 g/dL (ref 13.0–18.0)
MCH: 29.7 pg (ref 26.0–34.0)
MCHC: 34 g/dL (ref 32.0–36.0)
MCV: 87.1 fL (ref 80.0–100.0)
Platelets: 227 10*3/uL (ref 150–440)
RBC: 4.64 MIL/uL (ref 4.40–5.90)
RDW: 15.1 % — AB (ref 11.5–14.5)
WBC: 8.1 10*3/uL (ref 3.8–10.6)

## 2016-03-11 LAB — URINALYSIS COMPLETE WITH MICROSCOPIC (ARMC ONLY)
Bacteria, UA: NONE SEEN
Bilirubin Urine: NEGATIVE
GLUCOSE, UA: NEGATIVE mg/dL
KETONES UR: NEGATIVE mg/dL
Leukocytes, UA: NEGATIVE
NITRITE: NEGATIVE
Protein, ur: NEGATIVE mg/dL
SPECIFIC GRAVITY, URINE: 1.012 (ref 1.005–1.030)
Squamous Epithelial / LPF: NONE SEEN
pH: 6 (ref 5.0–8.0)

## 2016-03-11 LAB — BASIC METABOLIC PANEL
Anion gap: 6 (ref 5–15)
BUN: 18 mg/dL (ref 6–20)
CHLORIDE: 103 mmol/L (ref 101–111)
CO2: 28 mmol/L (ref 22–32)
Calcium: 9.7 mg/dL (ref 8.9–10.3)
Creatinine, Ser: 1.34 mg/dL — ABNORMAL HIGH (ref 0.61–1.24)
GFR calc Af Amer: >60 mL/min (ref >60)
GFR calc non Af Amer: 52 mL/min — ABNORMAL LOW (ref >60)
GLUCOSE: 104 mg/dL — AB (ref 65–99)
POTASSIUM: 4.8 mmol/L (ref 3.5–5.1)
Sodium: 137 mmol/L (ref 135–145)

## 2016-03-11 LAB — HEPATIC FUNCTION PANEL
ALBUMIN: 4.1 g/dL (ref 3.5–5.0)
ALK PHOS: 86 U/L (ref 38–126)
ALT: 18 U/L (ref 17–63)
AST: 21 U/L (ref 15–41)
BILIRUBIN TOTAL: 1 mg/dL (ref 0.3–1.2)
Bilirubin, Direct: 0.2 mg/dL (ref 0.1–0.5)
Indirect Bilirubin: 0.8 mg/dL (ref 0.3–0.9)
Total Protein: 6.7 g/dL (ref 6.5–8.1)

## 2016-03-11 MED ORDER — OXYCODONE-ACETAMINOPHEN 5-325 MG PO TABS
1.0000 | ORAL_TABLET | ORAL | Status: DC | PRN
  Administered 2016-03-11: 1 via ORAL
  Filled 2016-03-11: qty 1

## 2016-03-11 MED ORDER — DIATRIZOATE MEGLUMINE & SODIUM 66-10 % PO SOLN
15.0000 mL | Freq: Once | ORAL | Status: AC
  Administered 2016-03-11: 15 mL via ORAL

## 2016-03-11 MED ORDER — IOPAMIDOL (ISOVUE-300) INJECTION 61%
100.0000 mL | Freq: Once | INTRAVENOUS | Status: AC | PRN
  Administered 2016-03-11: 100 mL via INTRAVENOUS

## 2016-03-11 MED ORDER — ONDANSETRON HCL 4 MG/2ML IJ SOLN
4.0000 mg | Freq: Once | INTRAMUSCULAR | Status: AC
  Administered 2016-03-11: 4 mg via INTRAVENOUS
  Filled 2016-03-11: qty 2

## 2016-03-11 MED ORDER — SODIUM CHLORIDE 0.9 % IV BOLUS (SEPSIS)
500.0000 mL | Freq: Once | INTRAVENOUS | Status: AC
Start: 2016-03-11 — End: 2016-03-11
  Administered 2016-03-11: 500 mL via INTRAVENOUS

## 2016-03-11 MED ORDER — KETOROLAC TROMETHAMINE 30 MG/ML IJ SOLN
15.0000 mg | Freq: Once | INTRAMUSCULAR | Status: AC
  Administered 2016-03-11: 15 mg via INTRAVENOUS
  Filled 2016-03-11: qty 1

## 2016-03-11 MED ORDER — OXYCODONE HCL 5 MG PO TABS: 5.0000 mg | ORAL_TABLET | Freq: Four times a day (QID) | ORAL | Status: DC | PRN

## 2016-03-11 MED ORDER — ONDANSETRON 4 MG PO TBDP: 4.0000 mg | ORAL_TABLET | Freq: Three times a day (TID) | ORAL | Status: DC | PRN

## 2016-03-11 NOTE — Discharge Instructions (Signed)
Flank Pain °Flank pain refers to pain that is located on the side of the body between the upper abdomen and the back. The pain may occur over a short period of time (acute) or may be long-term or reoccurring (chronic). It may be mild or severe. Flank pain can be caused by many things. °CAUSES  °Some of the more common causes of flank pain include: °· Muscle strains.   °· Muscle spasms.   °· A disease of your spine (vertebral disk disease).   °· A lung infection (pneumonia).   °· Fluid around your lungs (pulmonary edema).   °· A kidney infection.   °· Kidney stones.   °· A very painful skin rash caused by the chickenpox virus (shingles).   °· Gallbladder disease.   °HOME CARE INSTRUCTIONS  °Home care will depend on the cause of your pain. In general, °· Rest as directed by your caregiver. °· Drink enough fluids to keep your urine clear or pale yellow. °· Only take over-the-counter or prescription medicines as directed by your caregiver. Some medicines may help relieve the pain. °· Tell your caregiver about any changes in your pain. °· Follow up with your caregiver as directed. °SEEK IMMEDIATE MEDICAL CARE IF:  °· Your pain is not controlled with medicine.   °· You have new or worsening symptoms. °· Your pain increases.   °· You have abdominal pain.   °· You have shortness of breath.   °· You have persistent nausea or vomiting.   °· You have swelling in your abdomen.   °· You feel faint or pass out.   °· You have blood in your urine. °· You have a fever or persistent symptoms for more than 2-3 days. °· You have a fever and your symptoms suddenly get worse. °MAKE SURE YOU:  °· Understand these instructions. °· Will watch your condition. °· Will get help right away if you are not doing well or get worse. °  °This information is not intended to replace advice given to you by your health care provider. Make sure you discuss any questions you have with your health care provider. °  °Document Released: 11/26/2005 Document  Revised: 06/29/2012 Document Reviewed: 05/19/2012 °Elsevier Interactive Patient Education ©2016 [employer] ° °

## 2016-03-11 NOTE — ED Notes (Signed)
States left flank pain since Sunday, denies any blood in his urine, hx of kidney stones

## 2016-03-11 NOTE — ED Provider Notes (Signed)
[facility] Emergency Department Provider Note   ____________________________________________  Time seen: Approximately 10:04 AM  I have reviewed the triage vital signs and the nursing notes.   HISTORY  Chief Complaint Flank Pain    HPI Timothy Singleton is a 70 y.o. male with history of coronary artery disease, hypertension, history of previous kidney stones who presents for evaluation of 3 days of gradual onset left flank pain, constant, severe, no modifying factors. He reports it feels similar to his prior kidney stones. No nausea, vomiting, diarrhea, fevers or chills. No chest pain or difficulty breathing. No abdominal pain.   Past Medical History  Diagnosis Date  . Hypertension   . Past heart attack   . Depression   . Cancer ([place])     melanoma / knee    Patient Active Problem List   Diagnosis Date Noted  . Arthritis of shoulder region, degenerative 09/10/2015  . Clinical depression 07/22/2015  . Acid reflux 07/22/2015  . IBS (irritable bowel syndrome) 07/22/2015  . AK (actinic keratosis) 07/22/2015  . TIA (transient ischemic attack) 07/22/2015  . OSA (obstructive sleep apnea) 07/22/2015  . Idiopathic trigeminal neuralgia 07/22/2015  . CAD in native artery 07/22/2015  . GERD (gastroesophageal reflux disease) 07/22/2015  . Allergic rhinitis 07/22/2015  . Arteriosclerosis of coronary artery 10/16/2014  . Essential (primary) hypertension 08/21/2014  . Combined fat and carbohydrate induced hyperlipemia 08/21/2014  . Heart attack ([place]) 08/13/2014  . Benign prostatic hypertrophy without urinary obstruction 01/16/2014  . Renal colic 0000000    Past Surgical History  Procedure Laterality Date  . Hernia repair      inguinal-right  . Vasectomy    . Knee surgery Right   . Tonsillectomy    . Bunionectomy    . Wrist surgery    . Upper gi endoscopy  10/18/01    hiatus hernia  . Total shoulder replacement    . Coronary stent  placement      Current Outpatient Rx  Name  Route  Sig  Dispense  Refill  . aspirin 81 MG tablet   Oral   Take 81 mg by mouth daily.         Marland Kitchen atorvastatin (LIPITOR) 80 MG tablet   Oral   Take 1 tablet (80 mg total) by mouth daily.   30 tablet   12   . carbamazepine (CARBATROL) 200 MG 12 hr capsule   Oral   Take 1 capsule (200 mg total) by mouth 2 (two) times daily.   60 capsule   12   . lisinopril (PRINIVIL,ZESTRIL) 10 MG tablet               . sertraline (ZOLOFT) 100 MG tablet   Oral   Take 1 tablet (100 mg total) by mouth daily.   30 tablet   12   . tamsulosin (FLOMAX) 0.4 MG CAPS capsule   Oral   Take 1 capsule (0.4 mg total) by mouth daily.   30 capsule   2   . traMADol (ULTRAM) 50 MG tablet   Oral   Take 1 tablet (50 mg total) by mouth every 4 (four) hours as needed.   100 tablet   1     Allergies Review of patient's allergies indicates no known allergies.  Family History  Problem Relation Age of Onset  . Cancer Mother   . Dementia Mother   . Stroke Father   . Heart disease Father   . Hypertension Father   .  Breast cancer Sister   . Melanoma Maternal Grandmother   . Parkinson's disease Brother   . Hypertension Brother     Social History Social History  Substance Use Topics  . Smoking status: Former Research scientist (life sciences)  . Smokeless tobacco: None     Comment: quit 40 years ago   . Alcohol Use: 0.0 oz/week    0 Standard drinks or equivalent per week     Comment: 2-4 times per month    Review of Systems Constitutional: No fever/chills Eyes: No visual changes. ENT: No sore throat. Cardiovascular: Denies chest pain. Respiratory: Denies shortness of breath. Gastrointestinal: No abdominal pain.  No nausea, no vomiting.  No diarrhea.  No constipation. Genitourinary: Negative for dysuria. Musculoskeletal: Positive for left flank pain. Skin: Negative for rash. Neurological: Negative for headaches, focal weakness or numbness.  10-point ROS  otherwise negative.  ____________________________________________   PHYSICAL EXAM:  Filed Vitals:   03/11/16 1130 03/11/16 1200 03/11/16 1230 03/11/16 1300  BP: 137/78 138/74 123/67 120/66  Pulse: 70 59 60 62  Temp:      TempSrc:      Resp:    16  Height:      Weight:      SpO2: 99% 95% 96% 96%    VITAL SIGNS: ED Triage Vitals  Enc Vitals Group     BP 03/11/16 0923 144/81 mmHg     Pulse Rate 03/11/16 0923 69     Resp 03/11/16 0923 18     Temp 03/11/16 0923 98.6 F (37 C)     Temp Source 03/11/16 0923 Oral     SpO2 03/11/16 0923 100 %     Weight 03/11/16 0923 165 lb (74.844 kg)     Height 03/11/16 0923 5\' 11"  (1.803 m)     Head Cir --      Peak Flow --      Pain Score 03/11/16 0924 10     Pain Loc --      Pain Edu? --      Excl. in [place]? --     Constitutional: Alert and oriented. Well appearing and in no acute distress. Eyes: Conjunctivae are normal. PERRL. EOMI. Head: Atraumatic. Nose: No congestion/rhinnorhea. Mouth/Throat: Mucous membranes are moist.  Oropharynx non-erythematous. Neck: No stridor. Supple without meningismus. Cardiovascular: Normal rate, regular rhythm. Grossly normal heart sounds.  Good peripheral circulation. Respiratory: Normal respiratory effort.  No retractions. Lungs CTAB. Gastrointestinal: Soft and nontender. No distention.  No CVA tenderness. Genitourinary: deferred Musculoskeletal: No lower extremity tenderness nor edema.  No joint effusions. Neurologic:  Normal speech and language. No gross focal neurologic deficits are appreciated. Skin:  Skin is warm, dry and intact. No rash noted. Psychiatric: Mood and affect are normal. Speech and behavior are normal.  ____________________________________________   LABS (all labs ordered are listed, but only abnormal results are displayed)  Labs Reviewed  URINALYSIS COMPLETEWITH MICROSCOPIC ([facility] ONLY) - Abnormal; Notable for the following:    Color, Urine YELLOW (*)    APPearance CLEAR (*)     Hgb urine dipstick 1+ (*)    All other components within normal limits  BASIC METABOLIC PANEL - Abnormal; Notable for the following:    Glucose, Bld 104 (*)    Creatinine, Ser 1.34 (*)    GFR calc non Af Amer 52 (*)    All other components within normal limits  CBC - Abnormal; Notable for the following:    RDW 15.1 (*)    All other components within normal limits  HEPATIC FUNCTION PANEL   ____________________________________________  EKG  none ____________________________________________  RADIOLOGY  CT abdomen and pelvis  IMPRESSION: LEFT hydronephrosis, hydroureter and delay in LEFT nephrogram secondary to 2 adjacent calcifications at the LEFT ureterovesical junction measuring 3 mm and 6 mm in diameter.  Additional BILATERAL nonobstructing renal calculi.  Prostatic enlargement with minimal bladder wall thickening which may reflect chronic outlet obstruction and muscular hypertrophy  Scattered small hepatic cysts.  Atherosclerotic disease.  RIGHT inguinal hernia containing fat. ____________________________________________   PROCEDURES  Procedure(s) performed: None  Critical Care performed: No  ____________________________________________   INITIAL IMPRESSION / ASSESSMENT AND PLAN / ED COURSE  Pertinent labs & imaging results that were available during my care of the patient were reviewed by me and considered in my medical decision making (see chart for details).  Timothy Singleton is a 70 y.o. male with history of coronary artery disease, hypertension, history of previous kidney stones who presents for evaluation of 3 days of gradual onset left flank pain. On exam, he is generally well-appearing and in no acute distress. Vital signs stable, he is afebrile. No CVA tenderness, no abdominal tenderness. Plan for screening labs, UA, we'll treat him symptomatically, reassess for disposition and need for advanced  imaging.  ----------------------------------------- 2:22 PM on 03/11/2016 -----------------------------------------  Labs reviewed. BMP shows mild creatinine elevation at 1.34, likely prerenal, we'll give IV fluids. CBC and LFTs unremarkable. Urinalysis is not exactly consistent with urinary tract infection or ureterolithiasis, there are minimal red and white blood cells, no bacteria. Given his age and the nature of his pain complaints, we'll obtain contrasted CT of the abdomen and pelvis.  ----------------------------------------- 3:01 PM on 03/11/2016 ----------------------------------------- CT scan shows left UVJ stones measuring 3 mm and 6 mm in diameter. Patient reports improvement of his pain, he appears comfortable. We discussed return precautions, need for close urology follow-up and he has wife at bedside are comfortable with the discharge plan. We'll DC with expectant management.  ____________________________________________   FINAL CLINICAL IMPRESSION(S) / ED DIAGNOSES  Final diagnoses:  Acute left flank pain  Ureterolithiasis      NEW MEDICATIONS STARTED DURING THIS VISIT:  New Prescriptions   No medications on file     Note:  This document was prepared using Dragon voice recognition software and may include unintentional dictation errors.    Joanne Gavel, MD 03/11/16 [phone]

## 2016-03-17 ENCOUNTER — Ambulatory Visit (INDEPENDENT_AMBULATORY_CARE_PROVIDER_SITE_OTHER): Payer: PPO | Admitting: Family Medicine

## 2016-03-17 VITALS — BP 114/62 | HR 64 | Temp 98.2°F | Resp 14 | Wt 163.0 lb

## 2016-03-17 DIAGNOSIS — I2510 Atherosclerotic heart disease of native coronary artery without angina pectoris: Secondary | ICD-10-CM | POA: Diagnosis not present

## 2016-03-17 DIAGNOSIS — N644 Mastodynia: Secondary | ICD-10-CM | POA: Diagnosis not present

## 2016-03-17 DIAGNOSIS — M25612 Stiffness of left shoulder, not elsewhere classified: Secondary | ICD-10-CM | POA: Diagnosis not present

## 2016-03-17 DIAGNOSIS — Z9889 Other specified postprocedural states: Secondary | ICD-10-CM | POA: Diagnosis not present

## 2016-03-17 DIAGNOSIS — Z85820 Personal history of malignant melanoma of skin: Secondary | ICD-10-CM

## 2016-03-17 DIAGNOSIS — G4733 Obstructive sleep apnea (adult) (pediatric): Secondary | ICD-10-CM

## 2016-03-17 DIAGNOSIS — F329 Major depressive disorder, single episode, unspecified: Secondary | ICD-10-CM

## 2016-03-17 DIAGNOSIS — M25512 Pain in left shoulder: Secondary | ICD-10-CM | POA: Diagnosis not present

## 2016-03-17 DIAGNOSIS — F32A Depression, unspecified: Secondary | ICD-10-CM

## 2016-03-17 NOTE — Progress Notes (Signed)
Patient ID: Timothy Singleton, male   DOB: 14-Aug-1946, 70 y.o.   MRN: [id number]    Subjective:  HPI  Patient is here for follow up after last visit in February. He had his shoulder surgery and states before the surgery we were going to discuss re starting CPAP machine but patient states he is doing much better and sleeps better after the surgery. He thinks the arthritis and the pain in the shoulder was keeping him up and moving at night. Epworth score was 11 in February   He wanted to discuss right ear fullness, feels like there is fluid in there the past few weeks. No pain and no allergy symptoms.  He has discovered some discomfort in the left nipple with touch, noticed about 3 weeks maybe to 1 month. No discharge, no swelling nor redness present.   Prior to Admission medications   Medication Sig Start Date End Date Taking? Authorizing Provider  aspirin 81 MG tablet Take 81 mg by mouth daily.    Historical Provider, MD  atorvastatin (LIPITOR) 80 MG tablet Take 1 tablet (80 mg total) by mouth daily. 01/21/16   Richard Maceo Pro., MD  carbamazepine (CARBATROL) 200 MG 12 hr capsule Take 1 capsule (200 mg total) by mouth 2 (two) times daily. 01/08/16   Richard Maceo Pro., MD  lisinopril (PRINIVIL,ZESTRIL) 10 MG tablet  09/11/14   Historical Provider, MD  ondansetron (ZOFRAN ODT) 4 MG disintegrating tablet Take 1 tablet (4 mg total) by mouth every 8 (eight) hours as needed for nausea or vomiting. 03/11/16   Joanne Gavel, MD  oxyCODONE (ROXICODONE) 5 MG immediate release tablet Take 1 tablet (5 mg total) by mouth every 6 (six) hours as needed for moderate pain. Do not drive while taking this medication. 03/11/16   Joanne Gavel, MD  sertraline (ZOLOFT) 100 MG tablet Take 1 tablet (100 mg total) by mouth daily. 01/21/16 05/17/17  Jerrol Banana., MD  tamsulosin (FLOMAX) 0.4 MG CAPS capsule Take 1 capsule (0.4 mg total) by mouth daily. 01/21/16   Richard Maceo Pro., MD  traMADol (ULTRAM) 50 MG  tablet Take 1 tablet (50 mg total) by mouth every 4 (four) hours as needed. 01/21/16   Richard Maceo Pro., MD    Patient Active Problem List   Diagnosis Date Noted  . Arthritis of shoulder region, degenerative 09/10/2015  . Clinical depression 07/22/2015  . Acid reflux 07/22/2015  . IBS (irritable bowel syndrome) 07/22/2015  . AK (actinic keratosis) 07/22/2015  . TIA (transient ischemic attack) 07/22/2015  . OSA (obstructive sleep apnea) 07/22/2015  . Idiopathic trigeminal neuralgia 07/22/2015  . CAD in native artery 07/22/2015  . GERD (gastroesophageal reflux disease) 07/22/2015  . Allergic rhinitis 07/22/2015  . Arteriosclerosis of coronary artery 10/16/2014  . Essential (primary) hypertension 08/21/2014  . Combined fat and carbohydrate induced hyperlipemia 08/21/2014  . Heart attack ([place]) 08/13/2014  . Benign prostatic hypertrophy without urinary obstruction 01/16/2014  . Renal colic 0000000    Past Medical History  Diagnosis Date  . Hypertension   . Past heart attack   . Depression   . Cancer ([place])     melanoma / knee    Social History   Social History  . Marital Status: Married    Spouse Name: N/A  . Number of Children: N/A  . Years of Education: N/A   Occupational History  . Not on file.   Social History Main Topics  . Smoking status: Former  Smoker  . Smokeless tobacco: Not on file     Comment: quit 40 years ago   . Alcohol Use: 0.0 oz/week    0 Standard drinks or equivalent per week     Comment: 2-4 times per month  . Drug Use: Not on file  . Sexual Activity: Not on file   Other Topics Concern  . Not on file   Social History Narrative    No Known Allergies  Review of Systems  Constitutional: Negative.   HENT:       Ear fullness  Respiratory: Negative.   Cardiovascular: Negative.        Left nipple tenderness  Gastrointestinal: Negative.   Genitourinary: Negative.   Musculoskeletal: Positive for joint pain.  Skin:       Scar right  upper leg after melanoma excision    Immunization History  Administered Date(s) Administered  . Influenza, High Dose Seasonal PF 07/23/2015  . Pneumococcal Conjugate-13 07/11/2014  . Td 09/20/2003  . Zoster 01/28/2010   Objective:  BP 114/62 mmHg  Pulse 64  Temp(Src) 98.2 F (36.8 C)  Resp 14  Wt 163 lb (73.936 kg)  Physical Exam  Constitutional: He is oriented to person, place, and time and well-developed, well-nourished, and in no distress.  HENT:  Head: Normocephalic and atraumatic.  Right Ear: External ear normal. Tympanic membrane is retracted.  Left Ear: External ear normal. Tympanic membrane is not retracted.  Eyes: Conjunctivae are normal. Pupils are equal, round, and reactive to light.  Neck: Normal range of motion. Neck supple.  Cardiovascular: Normal rate, regular rhythm, normal heart sounds and intact distal pulses.   No murmur heard. Pulmonary/Chest: Effort normal and breath sounds normal. No respiratory distress. He has no wheezes. Right breast exhibits no inverted nipple, no mass, no nipple discharge and no skin change. Left breast exhibits skin change (irritation/ raw area present around left medial inner nipple area, no enlargement). Left breast exhibits no inverted nipple, no mass and no nipple discharge.  Neurological: He is alert and oriented to person, place, and time. Gait normal.  Skin: No rash noted.  Psychiatric: Mood, memory, affect and judgment normal.    Lab Results  Component Value Date   WBC 8.1 03/11/2016   HGB 13.8 03/11/2016   HCT 40.4 03/11/2016   PLT 227 03/11/2016   GLUCOSE 104* 03/11/2016   CHOL 98 09/28/2014   TRIG 87 09/28/2014   HDL 36 09/28/2014   LDLCALC 45 09/28/2014   TSH 2.03 09/28/2014    CMP     Component Value Date/Time   NA 137 03/11/2016 0929   NA 143 09/10/2015   K 4.8 03/11/2016 0929   CL 103 03/11/2016 0929   CO2 28 03/11/2016 0929   GLUCOSE 104* 03/11/2016 0929   BUN 18 03/11/2016 0929   BUN 14 09/10/2015     CREATININE 1.34* 03/11/2016 0929   CREATININE 0.9 09/10/2015   CREATININE 1.18 12/12/2012 0913   CALCIUM 9.7 03/11/2016 0929   PROT 6.7 03/11/2016 0929   ALBUMIN 4.1 03/11/2016 0929   AST 21 03/11/2016 0929   ALT 18 03/11/2016 0929   ALKPHOS 86 03/11/2016 0929   BILITOT 1.0 03/11/2016 0929   GFRNONAA 52* 03/11/2016 0929   GFRNONAA >60 12/12/2012 0913   GFRAA >60 03/11/2016 0929   GFRAA >60 12/12/2012 0913    Assessment and Plan :  1. OSA (obstructive sleep apnea) Discuss this with patient, patient feels better after having shoulder surgery and sleeping better but with  patient having CAD history I advised patient re starting CPAP machine is my best opinion. Patient states he will give it another try, follow on the next visit. Epworth score today is 6 better, was 11 in February before surgery. 2. H/O melanoma excision 5 weeks ago, healing well.  3. CAD in native artery Stable.  4. Clinical depression Stable.  5. Nipple tenderness Some irritation present, advised patient to use Neosporin on the area and follow as needed. Surgical referral if this continues. No breast enlargement/tenderness. 6. Kidney stones In May and was seen at ER and has appointment with Dr. Erlene Quan tomorrow May 31st.  Patient was seen and examined by Dr. Eulas Post and note was scribed by Theressa Millard, RMA.     Miguel Aschoff MD [facility] 03/17/2016 11:53 AM

## 2016-03-18 ENCOUNTER — Ambulatory Visit
Admission: RE | Admit: 2016-03-18 | Discharge: 2016-03-18 | Disposition: A | Discharge status: Home / Self Care | Payer: PPO | Source: Ambulatory Visit | Attending: Urology | Admitting: Urology

## 2016-03-18 ENCOUNTER — Encounter: Payer: Self-pay | Admitting: Urology

## 2016-03-18 ENCOUNTER — Ambulatory Visit (INDEPENDENT_AMBULATORY_CARE_PROVIDER_SITE_OTHER): Payer: PPO | Admitting: Urology

## 2016-03-18 VITALS — BP 122/70 | HR 73 | Ht 71.0 in | Wt 162.0 lb

## 2016-03-18 DIAGNOSIS — N200 Calculus of kidney: Secondary | ICD-10-CM

## 2016-03-18 DIAGNOSIS — R109 Unspecified abdominal pain: Secondary | ICD-10-CM | POA: Diagnosis not present

## 2016-03-18 DIAGNOSIS — N179 Acute kidney failure, unspecified: Secondary | ICD-10-CM

## 2016-03-18 DIAGNOSIS — N202 Calculus of kidney with calculus of ureter: Secondary | ICD-10-CM | POA: Diagnosis not present

## 2016-03-18 DIAGNOSIS — N201 Calculus of ureter: Secondary | ICD-10-CM | POA: Diagnosis not present

## 2016-03-18 NOTE — Progress Notes (Signed)
03/18/2016 5:55 PM   Timothy Singleton 01-Apr-1946 [id number]  Referring provider: Jerrol Banana., MD [address] [place], [place] [address]  Chief Complaint  Patient presents with  . Nephrolithiasis    NEw Patient    HPI: 70 year old male with a history of nephrolithiasis who presented to the emergency room on 03/11/2016 with 3 days of gradual onset left severe flank pain. He underwent CT abdomen pelvis which showed 2 left distal ureteral stones measuring 3 and 6 mm at the level of the UVJ along with severe hydroureteronephrosis.  He was otherwise hemodynamically stable without evidence of infection on UA. No leukocytosis. His current and was mildly elevated to 1.34 (baseline 0.9). He was discharged home with urology f/u.  His flank pain resolved shortly thereafter. He did pass several small stone fragments which he brings with him today. He has not had any discomfort since.  CT scan did also show several nonobstructing stones, right greater than left measuring up to 11 mm on the right. This appears to be stable in size since at least 2015 (KUB).  He does have a personal history of kidney stones.  He is S/p ESWL in ~2012 by Dr. Bernardo Heater.  He has not had any issues with flank pain or gross hematuria since that time.  He does admit that he does not drink enough water.  He does not think he is ever had a stone analysis. He's never had a 123456 urine metabolic workup.  He will be undergoing knee replacement surgery on Monday.  PMH: Past Medical History  Diagnosis Date  . Hypertension   . Past heart attack   . Depression   . Cancer ([place])     melanoma / knee  . GERD (gastroesophageal reflux disease)   . Arthritis   . Hyperlipemia   . Sleep apnea     Surgical History: Past Surgical History  Procedure Laterality Date  . Hernia repair      inguinal-right  . Vasectomy    . Knee surgery Right   . Tonsillectomy    . Bunionectomy    . Wrist surgery    .  Upper gi endoscopy  10/18/01    hiatus hernia  . Total shoulder replacement    . Coronary stent placement    . Extracorporeal shock wave lithotripsy      Home Medications:    Medication List       This list is accurate as of: 03/18/16  5:55 PM.  Always use your most recent med list.               aspirin 81 MG tablet  Take 81 mg by mouth daily.     atorvastatin 80 MG tablet  Commonly known as:  LIPITOR  Take 1 tablet (80 mg total) by mouth daily.     carbamazepine 200 MG 12 hr capsule  Commonly known as:  CARBATROL  Take 1 capsule (200 mg total) by mouth 2 (two) times daily.     lisinopril 10 MG tablet  Commonly known as:  PRINIVIL,ZESTRIL     sertraline 100 MG tablet  Commonly known as:  ZOLOFT  Take 1 tablet (100 mg total) by mouth daily.     tamsulosin 0.4 MG Caps capsule  Commonly known as:  FLOMAX  Take 1 capsule (0.4 mg total) by mouth daily.        Allergies: No Known Allergies  Family History: Family History  Problem Relation Age of Onset  .  Cancer Mother   . Dementia Mother   . Stroke Father   . Heart disease Father   . Hypertension Father   . Breast cancer Sister   . Melanoma Maternal Grandmother   . Parkinson's disease Brother   . Hypertension Brother     Social History:  reports that he has quit smoking. He does not have any smokeless tobacco history on file. He reports that he drinks alcohol. His drug history is not on file.  ROS: UROLOGY Frequent Urination?: No Hard to postpone urination?: Yes Burning/pain with urination?: No Get up at night to urinate?: Yes Leakage of urine?: No Urine stream starts and stops?: No Trouble starting stream?: No Do you have to strain to urinate?: No Blood in urine?: No Urinary tract infection?: No Sexually transmitted disease?: No Injury to kidneys or bladder?: No Painful intercourse?: No Weak stream?: No Erection problems?: No Penile pain?: No  Gastrointestinal Nausea?: No Vomiting?:  No Indigestion/heartburn?: No Diarrhea?: No Constipation?: No  Constitutional Fever: No Night sweats?: No Weight loss?: No Fatigue?: No  Skin Skin rash/lesions?: No Itching?: No  Eyes Blurred vision?: No Double vision?: No  Ears/Nose/Throat Sore throat?: No Sinus problems?: No  Hematologic/Lymphatic Swollen glands?: No Easy bruising?: No  Cardiovascular Leg swelling?: No Chest pain?: No  Respiratory Cough?: No Shortness of breath?: No  Endocrine Excessive thirst?: No  Musculoskeletal Back pain?: No Joint pain?: Yes  Neurological Headaches?: No Dizziness?: No  Psychologic Depression?: Yes Anxiety?: No  Physical Exam: BP 122/70 mmHg  Pulse 73  Ht 5\' 11"  (1.803 m)  Wt 162 lb (73.483 kg)  BMI 22.60 kg/m2  Constitutional:  Alert and oriented, No acute distress. HEENT: [place] AT, moist mucus membranes.  Trachea midline, no masses. Cardiovascular: No clubbing, cyanosis, or edema. Respiratory: Normal respiratory effort, no increased work of breathing. GI: Abdomen is soft, nontender, nondistended, no abdominal masses GU: No CVA tenderness.  Skin: No rashes, bruises or suspicious lesions. Neurologic: Grossly intact, no focal deficits, moving all 4 extremities. Psychiatric: Normal mood and affect.  Laboratory Data: Lab Results  Component Value Date   WBC 8.1 03/11/2016   HGB 13.8 03/11/2016   HCT 40.4 03/11/2016   MCV 87.1 03/11/2016   PLT 227 03/11/2016    Lab Results  Component Value Date   CREATININE 1.34* 03/11/2016    Imaging: Study Result     CLINICAL DATA: LEFT flank pain since Sunday, history of kidney stones, melanoma, coronary disease post MI and coronary stenting, hypertension, former smoker  EXAM: CT ABDOMEN AND PELVIS WITH CONTRAST  TECHNIQUE: Multidetector CT imaging of the abdomen and pelvis was performed using the standard protocol following bolus administration of intravenous contrast. Sagittal and coronal MPR images  reconstructed from axial data set.  CONTRAST: 139mL ISOVUE-300 IOPAMIDOL (ISOVUE-300) INJECTION 61% IV. Dilute oral contrast.  COMPARISON: 01/30/2014  FINDINGS: Lower chest: Lung bases clear.  Hepatobiliary: Few scattered hepatic cysts. Liver and gallbladder otherwise normal appearance. No biliary dilatation.  Pancreas: Normal appearance  Spleen: Normal appearance  Adrenals/Urinary Tract: Adrenal glands normal appearance. BILATERAL nonobstructing renal calculi largest a tmid RIGHT kidney 11 mm diameter. LEFT hydronephrosis with delay in LEFT nephrogram. Dilatation of LEFT ureter terminating at LEFT ureterovesical junction where 2 distal ureteral calculi are identified, a 3 mm calculus at the ureterovesical junction and an adjacent 6 mm calculus, image 71. Mild bladder wall thickening which may reflect chronic outlet obstruction secondary to prostatic enlargement, gland measuring 5.6 x 4.5 cm image 76.  Stomach/Bowel: Normal appendix. Distal  colon incompletely distended and un opacified limiting assessment of wall thickness. Stomach and remaining bowel loops normal appearance.  Vascular/Lymphatic: Scattered atherosclerotic calcifications in abdomen and pelvis. Coronary arterial calcifications. No adenopathy.  Reproductive: N/A  Other: No mass, free air or free fluid. Small RIGHT inguinal hernia containing fat.  Musculoskeletal: Mild scattered degenerative disc and facet disease changes lumbar spine.  IMPRESSION: LEFT hydronephrosis, hydroureter and delay in LEFT nephrogram secondary to 2 adjacent calcifications at the LEFT ureterovesical junction measuring 3 mm and 6 mm in diameter.  Additional BILATERAL nonobstructing renal calculi.  Prostatic enlargement with minimal bladder wall thickening which may reflect chronic outlet obstruction and muscular hypertrophy  Scattered small hepatic cysts.  Atherosclerotic disease.  RIGHT inguinal hernia  containing fat.   Electronically Signed  By: Lavonia Dana M.D.  On: 03/11/2016 15:05    Assessment & Plan:  70 year old male with a history of nephrolithiasis who presumably has passed a 6 no meter 3 mm left distal ureteral stone. Given upcoming surgery, we'll obtain KUB today to ensure that this is truly past and he does not need any further intervention at this time.  1. Kidney stones Multiple nonobstructing kidney stones, largest measuring 11 mm right. CT scan was reviewed today with the patient.  Given the size of the right-sided stone, I would recommend intervention in the future for this stone is if it were to pass, he would need emergent intervention.  Discussed alternatives today including ESWL versus ureteroscopy.  In that he is asymptomatic at this time and has been stable for many years, this can be deferred for several months until he is adequately healed from his knee replacement surgery. We discussed further at that time.  We did have a discussion today about a stone diet and stone prevention techniques. Handout was given today with additional information.  Rarely, adequate hydration was pushed.  We'll consider 24 hour urine metabolic workup given his multiple nonobstructing stones.  Stone fragments of her stone analysis today.  - DG Abd 1 View; Future  2. Left ureteral stone As above  3. Left flank pain Resolved  4. AKI Likely secondary to prerenal causes versus obstructio which has since resolved.  Return in about 3 months (around 06/18/2016) for discuss right kidney stone.  Hollice Espy, MD  [facility] [address] [place], [place] [address] [phone]

## 2016-03-18 NOTE — Patient Instructions (Signed)
Dietary Guidelines to Help Prevent Kidney Stones Your risk of kidney stones can be decreased by adjusting the foods you eat. The most important thing you can do is drink enough fluid. You should drink enough fluid to keep your urine clear or pale yellow. The following guidelines provide specific information for the type of kidney stone you have had. GUIDELINES ACCORDING TO TYPE OF KIDNEY STONE Calcium Oxalate Kidney Stones  Reduce the amount of salt you eat. Foods that have a lot of salt cause your body to release excess calcium into your urine. The excess calcium can combine with a substance called oxalate to form kidney stones.  Reduce the amount of animal protein you eat if the amount you eat is excessive. Animal protein causes your body to release excess calcium into your urine. Ask your dietitian how much protein from animal sources you should be eating.  Avoid foods that are high in oxalates. If you take vitamins, they should have less than 500 mg of vitamin C. Your body turns vitamin C into oxalates. You do not need to avoid fruits and vegetables high in vitamin C. Calcium Phosphate Kidney Stones  Reduce the amount of salt you eat to help prevent the release of excess calcium into your urine.  Reduce the amount of animal protein you eat if the amount you eat is excessive. Animal protein causes your body to release excess calcium into your urine. Ask your dietitian how much protein from animal sources you should be eating.  Get enough calcium from food or take a calcium supplement (ask your dietitian for recommendations). Food sources of calcium that do not increase your risk of kidney stones include:  Broccoli.  Dairy products, such as cheese and yogurt.  Pudding. Uric Acid Kidney Stones  Do not have more than 6 oz of animal protein per day. FOOD SOURCES Animal Protein Sources  Meat (all types).  Poultry.  Eggs.  Fish, seafood. Foods High in Salt  Salt seasonings.  Soy  sauce.  Teriyaki sauce.  Cured and processed meats.  Salted crackers and snack foods.  Fast food.  Canned soups and most canned foods. Foods High in Oxalates  Grains:  Amaranth.  Barley.  Grits.  Wheat germ.  Bran.  Buckwheat flour.  All bran cereals.  Pretzels.  Whole wheat bread.  Vegetables:  Beans (wax).  Beets and beet greens.  Collard greens.  Eggplant.  Escarole.  Leeks.  Okra.  Parsley.  Rutabagas.  Spinach.  Swiss chard.  Tomato paste.  Fried potatoes.  Sweet potatoes.  Fruits:  Red currants.  Figs.  Kiwi.  Rhubarb.  Meat and Other Protein Sources:  Beans (dried).  Soy burgers and other soybean products.  Miso.  Nuts (peanuts, almonds, pecans, cashews, hazelnuts).  Nut butters.  Sesame seeds and tahini (paste made of sesame seeds).  Poppy seeds.  Beverages:  Chocolate drink mixes.  Soy milk.  Instant iced tea.  Juices made from high-oxalate fruits or vegetables.  Other:  Carob.  Chocolate.  Fruitcake.  Marmalades.   This information is not intended to replace advice given to you by your health care provider. Make sure you discuss any questions you have with your health care provider.   Document Released: 01/30/2011 Document Revised: 10/10/2013 Document Reviewed: 09/01/2013 Elsevier Interactive Patient Education 2016 Elsevier Inc.  

## 2016-03-19 DIAGNOSIS — M25612 Stiffness of left shoulder, not elsewhere classified: Secondary | ICD-10-CM | POA: Diagnosis not present

## 2016-03-19 DIAGNOSIS — M25512 Pain in left shoulder: Secondary | ICD-10-CM | POA: Diagnosis not present

## 2016-03-23 DIAGNOSIS — G629 Polyneuropathy, unspecified: Secondary | ICD-10-CM | POA: Diagnosis not present

## 2016-03-23 DIAGNOSIS — Z79899 Other long term (current) drug therapy: Secondary | ICD-10-CM | POA: Diagnosis not present

## 2016-03-23 DIAGNOSIS — M1711 Unilateral primary osteoarthritis, right knee: Secondary | ICD-10-CM | POA: Diagnosis not present

## 2016-03-23 DIAGNOSIS — I252 Old myocardial infarction: Secondary | ICD-10-CM | POA: Diagnosis not present

## 2016-03-23 DIAGNOSIS — Z85820 Personal history of malignant melanoma of skin: Secondary | ICD-10-CM | POA: Diagnosis not present

## 2016-03-23 DIAGNOSIS — N400 Enlarged prostate without lower urinary tract symptoms: Secondary | ICD-10-CM | POA: Diagnosis not present

## 2016-03-23 DIAGNOSIS — Z7982 Long term (current) use of aspirin: Secondary | ICD-10-CM | POA: Diagnosis not present

## 2016-03-23 DIAGNOSIS — G4733 Obstructive sleep apnea (adult) (pediatric): Secondary | ICD-10-CM | POA: Diagnosis not present

## 2016-03-23 DIAGNOSIS — Z87442 Personal history of urinary calculi: Secondary | ICD-10-CM | POA: Diagnosis not present

## 2016-03-23 DIAGNOSIS — Z471 Aftercare following joint replacement surgery: Secondary | ICD-10-CM | POA: Diagnosis not present

## 2016-03-23 DIAGNOSIS — K219 Gastro-esophageal reflux disease without esophagitis: Secondary | ICD-10-CM | POA: Diagnosis not present

## 2016-03-23 DIAGNOSIS — F329 Major depressive disorder, single episode, unspecified: Secondary | ICD-10-CM | POA: Diagnosis not present

## 2016-03-23 DIAGNOSIS — Z955 Presence of coronary angioplasty implant and graft: Secondary | ICD-10-CM | POA: Diagnosis not present

## 2016-03-23 DIAGNOSIS — Z87891 Personal history of nicotine dependence: Secondary | ICD-10-CM | POA: Diagnosis not present

## 2016-03-23 DIAGNOSIS — I2510 Atherosclerotic heart disease of native coronary artery without angina pectoris: Secondary | ICD-10-CM | POA: Diagnosis not present

## 2016-03-23 DIAGNOSIS — Z96651 Presence of right artificial knee joint: Secondary | ICD-10-CM | POA: Diagnosis not present

## 2016-03-23 DIAGNOSIS — Z9119 Patient's noncompliance with other medical treatment and regimen: Secondary | ICD-10-CM | POA: Diagnosis not present

## 2016-03-23 DIAGNOSIS — I10 Essential (primary) hypertension: Secondary | ICD-10-CM | POA: Diagnosis not present

## 2016-03-23 DIAGNOSIS — E7800 Pure hypercholesterolemia, unspecified: Secondary | ICD-10-CM | POA: Diagnosis not present

## 2016-03-24 ENCOUNTER — Telehealth: Payer: Self-pay

## 2016-03-24 DIAGNOSIS — M1711 Unilateral primary osteoarthritis, right knee: Secondary | ICD-10-CM | POA: Diagnosis not present

## 2016-03-24 NOTE — Telephone Encounter (Signed)
-----   Message from Hollice Espy, MD sent at 03/19/2016  9:27 AM EDT ----- Please let this patient know that I didn;t see the stone near the bladder, likely passed these fragments.  Please have him call us ASAP if he develops recurrent pain.  Otherwise, we will see him in a few months.  Hollice Espy, MD

## 2016-03-24 NOTE — Telephone Encounter (Signed)
LMOM

## 2016-03-25 DIAGNOSIS — Z471 Aftercare following joint replacement surgery: Secondary | ICD-10-CM | POA: Diagnosis not present

## 2016-03-25 DIAGNOSIS — Z4802 Encounter for removal of sutures: Secondary | ICD-10-CM | POA: Diagnosis not present

## 2016-03-25 DIAGNOSIS — Z7982 Long term (current) use of aspirin: Secondary | ICD-10-CM | POA: Diagnosis not present

## 2016-03-25 DIAGNOSIS — Z96651 Presence of right artificial knee joint: Secondary | ICD-10-CM | POA: Diagnosis not present

## 2016-03-25 NOTE — Telephone Encounter (Signed)
Spoke with pt in reference to stone fragments. Pt voiced understanding.

## 2016-03-30 DIAGNOSIS — Z471 Aftercare following joint replacement surgery: Secondary | ICD-10-CM | POA: Diagnosis not present

## 2016-03-30 DIAGNOSIS — Z96651 Presence of right artificial knee joint: Secondary | ICD-10-CM | POA: Diagnosis not present

## 2016-03-30 DIAGNOSIS — Z7982 Long term (current) use of aspirin: Secondary | ICD-10-CM | POA: Diagnosis not present

## 2016-03-30 DIAGNOSIS — Z4802 Encounter for removal of sutures: Secondary | ICD-10-CM | POA: Diagnosis not present

## 2016-03-31 ENCOUNTER — Other Ambulatory Visit: Payer: Self-pay | Admitting: Urology

## 2016-04-01 DIAGNOSIS — Z4802 Encounter for removal of sutures: Secondary | ICD-10-CM | POA: Diagnosis not present

## 2016-04-01 DIAGNOSIS — Z7982 Long term (current) use of aspirin: Secondary | ICD-10-CM | POA: Diagnosis not present

## 2016-04-01 DIAGNOSIS — Z96651 Presence of right artificial knee joint: Secondary | ICD-10-CM | POA: Diagnosis not present

## 2016-04-01 DIAGNOSIS — Z471 Aftercare following joint replacement surgery: Secondary | ICD-10-CM | POA: Diagnosis not present

## 2016-04-03 DIAGNOSIS — Z4802 Encounter for removal of sutures: Secondary | ICD-10-CM | POA: Diagnosis not present

## 2016-04-03 DIAGNOSIS — Z471 Aftercare following joint replacement surgery: Secondary | ICD-10-CM | POA: Diagnosis not present

## 2016-04-03 DIAGNOSIS — Z96651 Presence of right artificial knee joint: Secondary | ICD-10-CM | POA: Diagnosis not present

## 2016-04-03 DIAGNOSIS — Z7982 Long term (current) use of aspirin: Secondary | ICD-10-CM | POA: Diagnosis not present

## 2016-04-06 DIAGNOSIS — Z4802 Encounter for removal of sutures: Secondary | ICD-10-CM | POA: Diagnosis not present

## 2016-04-06 DIAGNOSIS — Z7982 Long term (current) use of aspirin: Secondary | ICD-10-CM | POA: Diagnosis not present

## 2016-04-06 DIAGNOSIS — Z471 Aftercare following joint replacement surgery: Secondary | ICD-10-CM | POA: Diagnosis not present

## 2016-04-06 DIAGNOSIS — Z96651 Presence of right artificial knee joint: Secondary | ICD-10-CM | POA: Diagnosis not present

## 2016-04-07 ENCOUNTER — Ambulatory Visit: Payer: PPO | Admitting: Physical Therapy

## 2016-04-09 ENCOUNTER — Encounter: Payer: PPO | Admitting: Physical Therapy

## 2016-04-13 ENCOUNTER — Ambulatory Visit: Payer: PPO | Attending: Orthopedic Surgery | Admitting: Physical Therapy

## 2016-04-13 DIAGNOSIS — M25561 Pain in right knee: Secondary | ICD-10-CM | POA: Diagnosis not present

## 2016-04-13 DIAGNOSIS — R262 Difficulty in walking, not elsewhere classified: Secondary | ICD-10-CM

## 2016-04-13 NOTE — Therapy (Signed)
[facility] [address] [place], [address] Phone: [phone]   Fax:  [phone]  Physical Therapy Evaluation  Patient Details  Name: Timothy Singleton MRN: [id number] Date of Birth: 1946-07-26 No Data Recorded  Encounter Date: 04/13/2016      PT End of Session - 04/13/16 1139    Visit Number 1   Number of Visits 9   Date for PT Re-Evaluation 05/25/16   PT Start Time 1032   PT Stop Time 1123   PT Time Calculation (min) 51 min   Activity Tolerance Patient tolerated treatment well   Behavior During Therapy [facility] for tasks assessed/performed      Past Medical History  Diagnosis Date  . Hypertension   . Past heart attack   . Depression   . Cancer ([place])     melanoma / knee  . GERD (gastroesophageal reflux disease)   . Arthritis   . Hyperlipemia   . Sleep apnea     Past Surgical History  Procedure Laterality Date  . Hernia repair      inguinal-right  . Vasectomy    . Knee surgery Right   . Tonsillectomy    . Bunionectomy    . Wrist surgery    . Upper gi endoscopy  10/18/01    hiatus hernia  . Total shoulder replacement    . Coronary stent placement    . Extracorporeal shock wave lithotripsy      There were no vitals filed for this visit.       Subjective Assessment - 04/13/16 1149    Subjective Patient reports he is 3 weeks s/p R TKR. He went home after sx and has been seeing HHPT since that time. He reports good ROM and is ambulating with either a cane or no device. He reports he has noticed some soreness in R hip joint and buttocks, he attributes to gait and exercises from HHPT. He has had trouble sleeping on his R side and continues to have some soreness with anterior knee.    Patient is accompained by: Family member   Pertinent History Patient had R TKR on 03/23/2016. Prior to that he had a melanoma excised on R quad ~ 5 weeks prior to TKR.    Limitations Walking;House hold  activities   Patient Stated Goals To return to his usual hobbies like golf.    Currently in Pain? Yes   Pain Score 5    Pain Location Hip   Pain Orientation Right   Pain Descriptors / Indicators Burning   Pain Type Acute pain   Pain Onset 1 to 4 weeks ago   Pain Frequency Intermittent   Aggravating Factors  Sleeping on R side, ambulaiton for long distances.    Pain Relieving Factors Rest             [facility] PT Assessment - 04/13/16 0001    Assessment   Medical Diagnosis --  R TKR   Referring Provider --  Dr. Ennis Forts   Onset Date/Surgical Date 03/23/16   Prior Therapy --  Home health PT   Precautions   Precautions None   Restrictions   Weight Bearing Restrictions No   Balance Screen   Has the patient fallen in the past 6 months No   Prior Function   Level of Independence Independent with community mobility with device   Vocation Retired   Leisure --  Enjoys Museum/gallery conservator   Overall  Cognitive Status Within Functional Limits for tasks assessed   Observation/Other Assessments   Lower Extremity Functional Scale  29   Observation/Other Assessments-Edema    Edema --  Noted in superior, medial, and lateral magins of the PFJ   Sensation   Light Touch Appears Intact   Single Leg Stance   Comments --  Can hold 5-9 seconds with trunk lean on R, WNL on L   Sit to Stand   Comments --  Able to complete with hands on thighs/not at all   AROM   Overall AROM Comments --  0-122 on knee ROM on R   Strength   Strength Assessment Site Hip;Knee   Right/Left Hip Right   Right Hip Flexion 5/5   Right Hip ABduction 4/5   Right/Left Knee --  5/5 in knee flexion/extension on RLE   Palpation   Palpation comment --  R lateral patellar edge   Standardized Balance Assessment   Five times sit to stand comments  12.68   10 Meter Walk 7.65   Timed Up and Go Test   Normal TUG (seconds) 9.67  No device   Functional Gait  Assessment   Gait Level Surface Walks 20 ft in less than  5.5 sec, no assistive devices, good speed, no evidence for imbalance, normal gait pattern, deviates no more than 6 in outside of the 12 in walkway width.   Change in Gait Speed Able to smoothly change walking speed without loss of balance or gait deviation. Deviate no more than 6 in outside of the 12 in walkway width.   Gait with Horizontal Head Turns Performs head turns smoothly with no change in gait. Deviates no more than 6 in outside 12 in walkway width   Gait with Vertical Head Turns Performs head turns with no change in gait. Deviates no more than 6 in outside 12 in walkway width.   Gait and Pivot Turn Pivot turns safely within 3 sec and stops quickly with no loss of balance.   Step Over Obstacle Is able to step over 2 stacked shoe boxes taped together (9 in total height) without changing gait speed. No evidence of imbalance.   Gait with Narrow Base of Support Ambulates 4-7 steps.   Gait with Eyes Closed Walks 20 ft, no assistive devices, good speed, no evidence of imbalance, normal gait pattern, deviates no more than 6 in outside 12 in walkway width. Ambulates 20 ft in less than 7 sec.   Ambulating Backwards Walks 20 ft, no assistive devices, good speed, no evidence for imbalance, normal gait   Steps Alternating feet, no rail.   Total Score 28                           PT Education - 04/13/16 1138    Education provided Yes   Education Details Patient educated to maintain elevation, ice and [place], will progress to strengthening activities in follow up sessions.    Person(s) Educated Patient   Methods Explanation   Comprehension Verbalized understanding             PT Long Term Goals - 04/13/16 1152    PT LONG TERM GOAL #1   Title Patient will report LEFS score of greater than 40/80 to demonstrate improved tolerance for ADLs.    Baseline 29/80   Time 6   Period Weeks   Status New   PT LONG TERM GOAL #2   Title Patient will  demonstrate 5/5 MMT on R hip  abductor test to demonstrate improved LE strength for ADL completion.    Baseline 4/5 on June 26.    Time 6   Period Weeks   Status New   PT LONG TERM GOAL #3   Title Patient will perform parallel squat with no HHA to demonstrate adequate LE strength for ADLs.    Time 6   Period Weeks   Status New   PT LONG TERM GOAL #4   Title Patient will report worst pain on VAS of less than 3/10 to demonstrate improved tolerance for ADLs.    Baseline 8/10   Time 6   Period Weeks   Status New               Plan - 04/13/16 1141    Clinical Impression Statement Patient did not bring order with him, MD office called however no order provided during session thus patient screened. Patient demonstrates excellent ROM, excellent balance, and walking speed at this point in his rehab. He demonstrates atrophy of R quadricep and decreased R hip abduction strength relative to anticipated levels for his activity level. He reports pain at night with sleeping, which appears to be related to inflammation and increased activity levels without concurrent increase in strength. Patient would benefit form skilled PT services to address his strength defiicts for return to ADLs.    Rehab Potential Good   Clinical Impairments Affecting Rehab Potential Excellent progress with home health therapy thus far, age.    PT Frequency 2x / week   PT Duration 4 weeks   PT Treatment/Interventions Therapeutic exercise;Therapeutic activities;Manual techniques;Gait training;Stair training   PT Next Visit Plan Quad, hip abduction strengthening. Ankle DF ROM.    Consulted and Agree with Plan of Care Patient      Patient will benefit from skilled therapeutic intervention in order to improve the following deficits and impairments:  Abnormal gait, Difficulty walking, Pain  Visit Diagnosis: Difficulty in walking, not elsewhere classified - Plan: PT plan of care cert/re-cert  Pain in right knee - Plan: PT plan of care  cert/re-cert      G-Codes - [id number]    Functional Assessment Tool Used TUG, FGA, 55m walk, 5x sit to stand, patient report    Functional Limitation Mobility: Walking and moving around   Mobility: Walking and Moving Around Current Status [id number]) At least 1 percent but less than 20 percent impaired, limited or restricted   Mobility: Walking and Moving Around Goal Status [id number]) 0 percent impaired, limited or restricted       Problem List Patient Active Problem List   Diagnosis Date Noted  . Arthritis of shoulder region, degenerative 09/10/2015  . Clinical depression 07/22/2015  . Acid reflux 07/22/2015  . IBS (irritable bowel syndrome) 07/22/2015  . AK (actinic keratosis) 07/22/2015  . TIA (transient ischemic attack) 07/22/2015  . OSA (obstructive sleep apnea) 07/22/2015  . Idiopathic trigeminal neuralgia 07/22/2015  . CAD in native artery 07/22/2015  . GERD (gastroesophageal reflux disease) 07/22/2015  . Allergic rhinitis 07/22/2015  . Arteriosclerosis of coronary artery 10/16/2014  . Essential (primary) hypertension 08/21/2014  . Combined fat and carbohydrate induced hyperlipemia 08/21/2014  . Heart attack ([place]) 08/13/2014  . Benign prostatic hypertrophy without urinary obstruction 01/16/2014  . Renal colic 0000000   Kerman Passey, PT, DPT    04/13/2016, 12:02 PM  [facility] [address], [place], [address] Phone: [phone]  Fax:  [phone]  Name: Timothy Singleton MRN: [id number] Date of Birth: 1946/07/14

## 2016-04-16 ENCOUNTER — Ambulatory Visit: Payer: PPO | Admitting: Physical Therapy

## 2016-04-16 DIAGNOSIS — M25561 Pain in right knee: Secondary | ICD-10-CM

## 2016-04-16 DIAGNOSIS — R262 Difficulty in walking, not elsewhere classified: Secondary | ICD-10-CM | POA: Diagnosis not present

## 2016-04-16 NOTE — Therapy (Signed)
[facility] [address] [place], [address] Phone: [phone]   Fax:  [phone]  Physical Therapy Treatment  Patient Details  Name: Timothy Singleton MRN: [id number] Date of Birth: 22-Sep-1946 No Data Recorded  Encounter Date: 04/16/2016      PT End of Session - 04/16/16 1355    Visit Number 2   Number of Visits 9   Date for PT Re-Evaluation 05/25/16   PT Start Time 1116   PT Stop Time 1158   PT Time Calculation (min) 42 min   Activity Tolerance Patient tolerated treatment well   Behavior During Therapy [facility] for tasks assessed/performed      Past Medical History  Diagnosis Date  . Hypertension   . Past heart attack   . Depression   . Cancer ([place])     melanoma / knee  . GERD (gastroesophageal reflux disease)   . Arthritis   . Hyperlipemia   . Sleep apnea     Past Surgical History  Procedure Laterality Date  . Hernia repair      inguinal-right  . Vasectomy    . Knee surgery Right   . Tonsillectomy    . Bunionectomy    . Wrist surgery    . Upper gi endoscopy  10/18/01    hiatus hernia  . Total shoulder replacement    . Coronary stent placement    . Extracorporeal shock wave lithotripsy      There were no vitals filed for this visit.      Subjective Assessment - 04/16/16 1353    Subjective Patient reports soreness/burning sensation in R hip, much decreased from previous session. He has no new falls and no use of AD on this session.    Patient is accompained by: Family member   Pertinent History Patient had R TKR on 03/23/2016. Prior to that he had a melanoma excised on R quad ~ 5 weeks prior to TKR.    Limitations Walking;House hold activities   Patient Stated Goals To return to his usual hobbies like golf.    Currently in Pain? No/denies  Mild burning in R hip, decreased from the other day.       Assessed sit to stand from black mat table, at lowest position able to complete  10 w/o use of UEs and no difficulty. Progressed to 5# DB for 10 with no issues, progressed to 10# DB for 10 repetitions again patient reported as easy, did note patient was shifting to his L, cued otherwise. Patient able to complete x 10 repetitions with 20# KB and no complaints.   HS Curls on OMEGA machine 2 sets x 12 repetitions at 25#  Standing hip abductions x 12 with red t-band for 2 sets bilaterally   Lateral band walks with red t-band x 12 for 2 sets bilaterally   Steps ups with 2 risers underneath x 12 bilaterally with HHA, x 12 bilaterally w/o HHA.                         PT Education - 04/16/16 1355    Education provided Yes   Education Details Provided with HEP, return follow ups.    Person(s) Educated Patient   Methods Explanation;Handout;Demonstration   Comprehension Returned demonstration;Verbalized understanding             PT Long Term Goals - 04/13/16 1152    PT LONG TERM GOAL #1  Title Patient will report LEFS score of greater than 40/80 to demonstrate improved tolerance for ADLs.    Baseline 29/80   Time 6   Period Weeks   Status New   PT LONG TERM GOAL #2   Title Patient will demonstrate 5/5 MMT on R hip abductor test to demonstrate improved LE strength for ADL completion.    Baseline 4/5 on June 26.    Time 6   Period Weeks   Status New   PT LONG TERM GOAL #3   Title Patient will perform parallel squat with no HHA to demonstrate adequate LE strength for ADLs.    Time 6   Period Weeks   Status New   PT LONG TERM GOAL #4   Title Patient will report worst pain on VAS of less than 3/10 to demonstrate improved tolerance for ADLs.    Baseline 8/10   Time 6   Period Weeks   Status New               Plan - 04/16/16 1356    Clinical Impression Statement Patient presenting with decreased pain in R hip today, reports as very mild burning. He demonstrates excellent balance, the majority of his deficits are strength and atrophy  related. He was provided with HEP which he found challenging today in clinic. Will progress as needed, though patient is making excellent progress.    Rehab Potential Good   Clinical Impairments Affecting Rehab Potential Excellent progress with home health therapy thus far, age.    PT Frequency 2x / week   PT Duration 4 weeks   PT Treatment/Interventions Therapeutic exercise;Therapeutic activities;Manual techniques;Gait training;Stair training   PT Next Visit Plan Quad, hip abduction strengthening. Ankle DF ROM.    PT Home Exercise Plan Squats with weight, standing hip abductions, lateral band walks.    Consulted and Agree with Plan of Care Patient      Patient will benefit from skilled therapeutic intervention in order to improve the following deficits and impairments:  Abnormal gait, Difficulty walking, Pain  Visit Diagnosis: Difficulty in walking, not elsewhere classified  Pain in right knee     Problem List Patient Active Problem List   Diagnosis Date Noted  . Arthritis of shoulder region, degenerative 09/10/2015  . Clinical depression 07/22/2015  . Acid reflux 07/22/2015  . IBS (irritable bowel syndrome) 07/22/2015  . AK (actinic keratosis) 07/22/2015  . TIA (transient ischemic attack) 07/22/2015  . OSA (obstructive sleep apnea) 07/22/2015  . Idiopathic trigeminal neuralgia 07/22/2015  . CAD in native artery 07/22/2015  . GERD (gastroesophageal reflux disease) 07/22/2015  . Allergic rhinitis 07/22/2015  . Arteriosclerosis of coronary artery 10/16/2014  . Essential (primary) hypertension 08/21/2014  . Combined fat and carbohydrate induced hyperlipemia 08/21/2014  . Heart attack ([place]) 08/13/2014  . Benign prostatic hypertrophy without urinary obstruction 01/16/2014  . Renal colic 0000000   Kerman Passey, PT, DPT    04/16/2016, 1:59 PM  [place] [facility] [address] [place], [address] Phone:  [phone]   Fax:  [phone]  Name: Timothy Singleton MRN: [id number] Date of Birth: 10-01-1946

## 2016-04-20 ENCOUNTER — Encounter: Payer: Self-pay | Admitting: Physical Therapy

## 2016-04-20 ENCOUNTER — Ambulatory Visit: Payer: PPO | Attending: Orthopedic Surgery | Admitting: Physical Therapy

## 2016-04-20 DIAGNOSIS — R262 Difficulty in walking, not elsewhere classified: Secondary | ICD-10-CM | POA: Diagnosis not present

## 2016-04-20 DIAGNOSIS — M25561 Pain in right knee: Secondary | ICD-10-CM | POA: Diagnosis not present

## 2016-04-20 NOTE — Therapy (Signed)
[place] [facility] [address], [place], [address] Phone: [phone]   Fax:  [phone]  Physical Therapy Treatment  Patient Details  Name: Timothy Singleton MRN: [id number] Date of Birth: [birth year] No Data Recorded  Encounter Date: 04/20/2016      PT End of Session - 04/20/16 1206    Visit Number 3   Number of Visits 9   Date for PT Re-Evaluation 05/25/16   PT Start Time 1115   PT Stop Time 1200   PT Time Calculation (min) 45 min   Equipment Utilized During Treatment Gait belt   Activity Tolerance Patient tolerated treatment well   Behavior During Therapy [facility] for tasks assessed/performed      Past Medical History  Diagnosis Date  . Hypertension   . Past heart attack   . Depression   . Cancer ([place])     melanoma / knee  . GERD (gastroesophageal reflux disease)   . Arthritis   . Hyperlipemia   . Sleep apnea     Past Surgical History  Procedure Laterality Date  . Hernia repair      inguinal-right  . Vasectomy    . Knee surgery Right   . Tonsillectomy    . Bunionectomy    . Wrist surgery    . Upper gi endoscopy  10/18/01    hiatus hernia  . Total shoulder replacement    . Coronary stent placement    . Extracorporeal shock wave lithotripsy      There were no vitals filed for this visit.      Subjective Assessment - 04/20/16 1205    Subjective Pt continues to have pain in R buttock, especially at night. R knee is doing well but still has some soreness and swelling. Icing helps.   Patient is accompained by: Family member   Pertinent History Patient had R TKR on 03/23/2016. Prior to that he had a melanoma excised on R quad ~ 5 weeks prior to TKR.    Limitations Walking;House hold activities   Patient Stated Goals To return to his usual hobbies like golf.    Currently in Pain? No/denies      Therex:  Reviewed his HEP and which exercises to perform from his old HEP including SLR, and SL  bridge in addition to his newly added HEP.  HS Curls on OMEGA machine at 25# x10, 35# 2x12  Standing hip abductions, SLR, and extension 3 x 12 with red t-band bilaterally   Lateral band walks with red t-band x 30 ft  bilaterally, green t-band x4 30 ft bilaterally  Wall squats with physioball with 5 sec hold 2x12  TKE with green t-band 2x12  Supine modified piriformis stretch (not in figure 4 position) R LE 2 x 30 seconds for reduced hip tightness and pain.  Cues for proper technique of exercises to target specific muscles, and for slow eccentric contractions for most effective strengthening.                           PT Education - 04/20/16 1206    Education provided Yes   Education Details reviewed HEP, supine modified piriformis stretch to reduce hip pain   Person(s) Educated Patient   Methods Explanation;Demonstration   Comprehension Verbalized understanding;Returned demonstration             PT Long Term Goals - 04/13/16 1152    PT LONG TERM GOAL #  1   Title Patient will report LEFS score of greater than 40/80 to demonstrate improved tolerance for ADLs.    Baseline 29/80   Time 6   Period Weeks   Status New   PT LONG TERM GOAL #2   Title Patient will demonstrate 5/5 MMT on R hip abductor test to demonstrate improved LE strength for ADL completion.    Baseline 4/5 on June 26.    Time 6   Period Weeks   Status New   PT LONG TERM GOAL #3   Title Patient will perform parallel squat with no HHA to demonstrate adequate LE strength for ADLs.    Time 6   Period Weeks   Status New   PT LONG TERM GOAL #4   Title Patient will report worst pain on VAS of less than 3/10 to demonstrate improved tolerance for ADLs.    Baseline 8/10   Time 6   Period Weeks   Status New               Plan - 04/20/16 1207    Clinical Impression Statement Pt was able to tolerated increased resistance and repetitions of LE strengthening exercises this session. Pt  educated to continue HEP as well as discussed TKE with R LE in stance phase. He will benefit from continued strengthening to progress further towards goals.    Rehab Potential Good   Clinical Impairments Affecting Rehab Potential Excellent progress with home health therapy thus far, age.    PT Frequency 2x / week   PT Duration 4 weeks   PT Treatment/Interventions Therapeutic exercise;Therapeutic activities;Manual techniques;Gait training;Stair training   PT Next Visit Plan Quad, hip abduction strengthening. Ankle DF ROM.    PT Home Exercise Plan Squats with weight, standing hip abductions, lateral band walks. Assess if supine piriformis stretch helped R buttock pain.   Consulted and Agree with Plan of Care Patient      Patient will benefit from skilled therapeutic intervention in order to improve the following deficits and impairments:  Abnormal gait, Difficulty walking, Pain  Visit Diagnosis: Difficulty in walking, not elsewhere classified  Pain in right knee     Problem List Patient Active Problem List   Diagnosis Date Noted  . Arthritis of shoulder region, degenerative 09/10/2015  . Clinical depression 07/22/2015  . Acid reflux 07/22/2015  . IBS (irritable bowel syndrome) 07/22/2015  . AK (actinic keratosis) 07/22/2015  . TIA (transient ischemic attack) 07/22/2015  . OSA (obstructive sleep apnea) 07/22/2015  . Idiopathic trigeminal neuralgia 07/22/2015  . CAD in native artery 07/22/2015  . GERD (gastroesophageal reflux disease) 07/22/2015  . Allergic rhinitis 07/22/2015  . Arteriosclerosis of coronary artery 10/16/2014  . Essential (primary) hypertension 08/21/2014  . Combined fat and carbohydrate induced hyperlipemia 08/21/2014  . Heart attack ([place]) 08/13/2014  . Benign prostatic hypertrophy without urinary obstruction 01/16/2014  . Renal colic 0000000    Neoma Laming, PT, DPT  04/20/2016, 12:12 PM [facility] [address] [place], [address] Phone: [phone]   Fax:  [phone]  Name: Broady Almas MRN: [id number] Date of Birth: 1946/07/02

## 2016-04-22 DIAGNOSIS — M25561 Pain in right knee: Secondary | ICD-10-CM | POA: Diagnosis not present

## 2016-04-22 DIAGNOSIS — M7989 Other specified soft tissue disorders: Secondary | ICD-10-CM | POA: Diagnosis not present

## 2016-04-23 ENCOUNTER — Ambulatory Visit: Payer: PPO | Admitting: Physical Therapy

## 2016-04-23 DIAGNOSIS — M25561 Pain in right knee: Secondary | ICD-10-CM

## 2016-04-23 DIAGNOSIS — R262 Difficulty in walking, not elsewhere classified: Secondary | ICD-10-CM

## 2016-04-23 NOTE — Therapy (Signed)
[facility] [address], [place], [address] Phone: [phone]   Fax:  [phone]  Physical Therapy Treatment  Patient Details  Name: Timothy Singleton MRN: [id number] Date of Birth: 1946/05/26 No Data Recorded  Encounter Date: 04/23/2016      PT End of Session - 04/23/16 1559    Visit Number 4   Number of Visits 9   Date for PT Re-Evaluation 05/25/16   PT Start Time [id number]   PT Stop Time 1557   PT Time Calculation (min) 41 min   Activity Tolerance Patient tolerated treatment well   Behavior During Therapy [facility] for tasks assessed/performed      Past Medical History  Diagnosis Date  . Hypertension   . Past heart attack   . Depression   . Cancer ([place])     melanoma / knee  . GERD (gastroesophageal reflux disease)   . Arthritis   . Hyperlipemia   . Sleep apnea     Past Surgical History  Procedure Laterality Date  . Hernia repair      inguinal-right  . Vasectomy    . Knee surgery Right   . Tonsillectomy    . Bunionectomy    . Wrist surgery    . Upper gi endoscopy  10/18/01    hiatus hernia  . Total shoulder replacement    . Coronary stent placement    . Extracorporeal shock wave lithotripsy      There were no vitals filed for this visit.      Subjective Assessment - 04/23/16 1518    Subjective Patient had a check in with his orthopedic yesterday. He reports he has had swelling in his R calf and foot, [place] yesterday was negative for DVT. Patient reports he continues to have posterior hip pain, new since this operation.    Patient is accompained by: Family member   Pertinent History Patient had R TKR on 03/23/2016. Prior to that he had a melanoma excised on R quad ~ 5 weeks prior to TKR.    Limitations Walking;House hold activities   Patient Stated Goals To return to his usual hobbies like golf.    Currently in Pain? Yes   Pain Score 5    Pain Location Hip   Pain Orientation Right   Pain  Descriptors / Indicators Burning   Pain Type Chronic pain   Pain Onset 1 to 4 weeks ago   Pain Frequency Intermittent      Palpation - hamstrings insertion/ischial tuberosity   HS Stretching at 90-90 x 8 for 2 sets, in progressively increased ranges of hip flexion. (Did not seem to stretch area of complaint, does not seem to be related to HS).   Piriformis stretch manually (targeted the spot that has been tender) - 2 sets x 8 repetitions with hand placement at midshaft due to complaint on lateral knee.   Single leg sit to stands with UE assistance x 5, x 6 repetitions   HS Curls x 35# x 12 repetitions, x 45# for 12 repetitions.   Leg press x 35# on RLE only x8 for 3 sets (able to complete 65# x 12 on LLE)                             PT Education - 04/23/16 1600    Education provided Yes   Education Details Patient educated to complete piriformis stretching at home  to point of strain.    Person(s) Educated Patient   Methods Explanation   Comprehension Verbalized understanding             PT Long Term Goals - 04/13/16 1152    PT LONG TERM GOAL #1   Title Patient will report LEFS score of greater than 40/80 to demonstrate improved tolerance for ADLs.    Baseline 29/80   Time 6   Period Weeks   Status New   PT LONG TERM GOAL #2   Title Patient will demonstrate 5/5 MMT on R hip abductor test to demonstrate improved LE strength for ADL completion.    Baseline 4/5 on June 26.    Time 6   Period Weeks   Status New   PT LONG TERM GOAL #3   Title Patient will perform parallel squat with no HHA to demonstrate adequate LE strength for ADLs.    Time 6   Period Weeks   Status New   PT LONG TERM GOAL #4   Title Patient will report worst pain on VAS of less than 3/10 to demonstrate improved tolerance for ADLs.    Baseline 8/10   Time 6   Period Weeks   Status New               Plan - 04/23/16 1559    Clinical Impression Statement Patient  continues to have burning sensation around ischial tuberosity, which appears to be relieved with directed piriformis strethcing in this session. He is progressing well with strengthening activities and able to toleraste increasing loads in all activities. He is progressing well towards established mobility goals.    Rehab Potential Good   Clinical Impairments Affecting Rehab Potential Excellent progress with home health therapy thus far, age.    PT Frequency 2x / week   PT Duration 4 weeks   PT Treatment/Interventions Therapeutic exercise;Therapeutic activities;Manual techniques;Gait training;Stair training   PT Next Visit Plan Quad, hip abduction strengthening. Ankle DF ROM.    PT Home Exercise Plan Squats with weight, standing hip abductions, lateral band walks. Assess if supine piriformis stretch helped R buttock pain.-- piriformis stretch.    Consulted and Agree with Plan of Care Patient      Patient will benefit from skilled therapeutic intervention in order to improve the following deficits and impairments:  Abnormal gait, Difficulty walking, Pain  Visit Diagnosis: Difficulty in walking, not elsewhere classified  Pain in right knee     Problem List Patient Active Problem List   Diagnosis Date Noted  . Arthritis of shoulder region, degenerative 09/10/2015  . Clinical depression 07/22/2015  . Acid reflux 07/22/2015  . IBS (irritable bowel syndrome) 07/22/2015  . AK (actinic keratosis) 07/22/2015  . TIA (transient ischemic attack) 07/22/2015  . OSA (obstructive sleep apnea) 07/22/2015  . Idiopathic trigeminal neuralgia 07/22/2015  . CAD in native artery 07/22/2015  . GERD (gastroesophageal reflux disease) 07/22/2015  . Allergic rhinitis 07/22/2015  . Arteriosclerosis of coronary artery 10/16/2014  . Essential (primary) hypertension 08/21/2014  . Combined fat and carbohydrate induced hyperlipemia 08/21/2014  . Heart attack ([place]) 08/13/2014  . Benign prostatic hypertrophy  without urinary obstruction 01/16/2014  . Renal colic 0000000   Timothy Singleton, PT, DPT    04/23/2016, 4:03 PM  [facility] [address] [place], [address] Phone: [phone]   Fax:  [phone]  Name: Timothy Singleton MRN: [id number] Date of Birth: 05/26/1946

## 2016-04-28 ENCOUNTER — Ambulatory Visit: Payer: PPO | Admitting: Physical Therapy

## 2016-04-28 DIAGNOSIS — R262 Difficulty in walking, not elsewhere classified: Secondary | ICD-10-CM | POA: Diagnosis not present

## 2016-04-28 DIAGNOSIS — M25561 Pain in right knee: Secondary | ICD-10-CM

## 2016-04-29 NOTE — Therapy (Signed)
[facility] [address], [place], [address] Phone: [phone]   Fax:  [phone]  Physical Therapy Treatment  Patient Details  Name: Timothy Singleton MRN: [id number] Date of Birth: 11/18/1945 No Data Recorded  Encounter Date: 04/28/2016      PT End of Session - 04/28/16 1332    Visit Number 5   Number of Visits 9   Date for PT Re-Evaluation 05/25/16   PT Start Time 1301   PT Stop Time 1339   PT Time Calculation (min) 38 min   Activity Tolerance Patient tolerated treatment well   Behavior During Therapy [facility] for tasks assessed/performed      Past Medical History  Diagnosis Date  . Hypertension   . Past heart attack   . Depression   . Cancer ([place])     melanoma / knee  . GERD (gastroesophageal reflux disease)   . Arthritis   . Hyperlipemia   . Sleep apnea     Past Surgical History  Procedure Laterality Date  . Hernia repair      inguinal-right  . Vasectomy    . Knee surgery Right   . Tonsillectomy    . Bunionectomy    . Wrist surgery    . Upper gi endoscopy  10/18/01    hiatus hernia  . Total shoulder replacement    . Coronary stent placement    . Extracorporeal shock wave lithotripsy      There were no vitals filed for this visit.      Subjective Assessment - 04/28/16 1304    Subjective Patient reports he continues to have moderate to severe pain in his R hip, particulalry at night. He reports it is alleviated with standing, worst with sitting or sleeping. The pain does not radiate down his leg, has started taking hydrocodone again. Pain is focal and in one area in posterior hip, distal to gluteal fold.    Patient is accompained by: Family member   Pertinent History Patient had R TKR on 03/23/2016. Prior to that he had a melanoma excised on R quad ~ 5 weeks prior to TKR.    Limitations Walking;House hold activities   Patient Stated Goals To return to his usual hobbies like golf.     Currently in Pain? Yes   Pain Score --  Rates as moderate.   Pain Location Hip   Pain Orientation Right   Pain Descriptors / Indicators Aching;Burning   Pain Type Chronic pain   Pain Onset 1 to 4 weeks ago   Pain Frequency Intermittent   Aggravating Factors  Sleeping on R side, ambulation for longer distances.       Isometric clamshells 3 x8 against PT resistance.   Isometric supine hip abductions with pillow under knees 3 x 8 with 5" holds with belt around knees   Educated on sleeping with pillow between knees if on his side   Educated to avoid crossing legs, piriformis stretching to avoid compression of any structures irritated in R hip.   Soft tissue assessment of posterior R hip, noted palpation, not necessarily muscular tightness over ischial tuberosity. Attempted STM over this area and surrounding area, compared to LLE much more tender though no definitive palpable differences bilaterally (palpation noted to have poor reliability).                            PT Education - 04/29/16 1307  Education provided Yes   Education Details Complete isometric hip abduction exercises, refrain from compressive activities on posterolateral hip.    Person(s) Educated Patient   Methods Explanation   Comprehension Verbalized understanding             PT Long Term Goals - 04/13/16 1152    PT LONG TERM GOAL #1   Title Patient will report LEFS score of greater than 40/80 to demonstrate improved tolerance for ADLs.    Baseline 29/80   Time 6   Period Weeks   Status New   PT LONG TERM GOAL #2   Title Patient will demonstrate 5/5 MMT on R hip abductor test to demonstrate improved LE strength for ADL completion.    Baseline 4/5 on June 26.    Time 6   Period Weeks   Status New   PT LONG TERM GOAL #3   Title Patient will perform parallel squat with no HHA to demonstrate adequate LE strength for ADLs.    Time 6   Period Weeks   Status New   PT LONG TERM  GOAL #4   Title Patient will report worst pain on VAS of less than 3/10 to demonstrate improved tolerance for ADLs.    Baseline 8/10   Time 6   Period Weeks   Status New               Plan - 04/28/16 1332    Clinical Impression Statement Patient continues to have complaint of pain in R posterior hip, surgical team aware and reports as "normal". Patient educated to decrease activities which can add pressure/compression to this area. This appears to be his biggest deficit, he demonstrates improving strength, appropriate ROM around R knee. He will benefit from skilled PT services to address residual strength, gait, pain deficits identified.    Rehab Potential Good   Clinical Impairments Affecting Rehab Potential Excellent progress with home health therapy thus far, age.    PT Frequency 2x / week   PT Duration 4 weeks   PT Treatment/Interventions Therapeutic exercise;Therapeutic activities;Manual techniques;Gait training;Stair training   PT Next Visit Plan Quad, hip abduction strengthening. Ankle DF ROM.    PT Home Exercise Plan Squats with weight, standing hip abductions, lateral band walks. Assess if supine piriformis stretch helped R buttock pain.-- piriformis stretch.    Consulted and Agree with Plan of Care Patient      Patient will benefit from skilled therapeutic intervention in order to improve the following deficits and impairments:  Abnormal gait, Difficulty walking, Pain  Visit Diagnosis: Difficulty in walking, not elsewhere classified  Pain in right knee     Problem List Patient Active Problem List   Diagnosis Date Noted  . Arthritis of shoulder region, degenerative 09/10/2015  . Clinical depression 07/22/2015  . Acid reflux 07/22/2015  . IBS (irritable bowel syndrome) 07/22/2015  . AK (actinic keratosis) 07/22/2015  . TIA (transient ischemic attack) 07/22/2015  . OSA (obstructive sleep apnea) 07/22/2015  . Idiopathic trigeminal neuralgia 07/22/2015  . CAD in  native artery 07/22/2015  . GERD (gastroesophageal reflux disease) 07/22/2015  . Allergic rhinitis 07/22/2015  . Arteriosclerosis of coronary artery 10/16/2014  . Essential (primary) hypertension 08/21/2014  . Combined fat and carbohydrate induced hyperlipemia 08/21/2014  . Heart attack ([place]) 08/13/2014  . Benign prostatic hypertrophy without urinary obstruction 01/16/2014  . Renal colic 0000000   Kerman Passey, PT, DPT    04/29/2016, 1:11 PM  [place] [facility] [address] [place], [address] Phone: [phone]   Fax:  [phone]  Name: Timothy Singleton MRN: [id number] Date of Birth: 1946/05/27

## 2016-04-30 ENCOUNTER — Ambulatory Visit: Payer: PPO | Admitting: Physical Therapy

## 2016-04-30 DIAGNOSIS — R262 Difficulty in walking, not elsewhere classified: Secondary | ICD-10-CM

## 2016-04-30 DIAGNOSIS — M25561 Pain in right knee: Secondary | ICD-10-CM

## 2016-05-04 ENCOUNTER — Encounter: Payer: PPO | Admitting: Physical Therapy

## 2016-05-04 NOTE — Therapy (Signed)
[facility] [address] [place], [address] Phone: [phone]   Fax:  [phone]  Physical Therapy Treatment  Patient Details  Name: Timothy Singleton MRN: [id number] Date of Birth: April 14, 1946 No Data Recorded  Encounter Date: 04/30/2016      PT End of Session - 05/04/16 0828    Visit Number 6   Number of Visits 9   Date for PT Re-Evaluation 05/25/16   PT Start Time [id number]   PT Stop Time 1828   PT Time Calculation (min) 43 min   Activity Tolerance Patient tolerated treatment well   Behavior During Therapy [facility] for tasks assessed/performed      Past Medical History  Diagnosis Date  . Hypertension   . Past heart attack   . Depression   . Cancer ([place])     melanoma / knee  . GERD (gastroesophageal reflux disease)   . Arthritis   . Hyperlipemia   . Sleep apnea     Past Surgical History  Procedure Laterality Date  . Hernia repair      inguinal-right  . Vasectomy    . Knee surgery Right   . Tonsillectomy    . Bunionectomy    . Wrist surgery    . Upper gi endoscopy  10/18/01    hiatus hernia  . Total shoulder replacement    . Coronary stent placement    . Extracorporeal shock wave lithotripsy      There were no vitals filed for this visit.      Subjective Assessment - 05/04/16 0825    Subjective Patient reports a significant reduction in pain with suggested HEP (to decrease posterior hip compression). Patient reports he will be going to the beach next week, and would like an HEP to address any deficits noted today.    Patient is accompained by: Family member   Pertinent History Patient had R TKR on 03/23/2016. Prior to that he had a melanoma excised on R quad ~ 5 weeks prior to TKR.    Limitations Walking;House hold activities   Patient Stated Goals To return to his usual hobbies like golf.    Currently in Pain? Yes   Pain Score --  Mild pain in posterior R hip, decreased from last  week.   Pain Location Hip   Pain Orientation Right   Pain Descriptors / Indicators Aching;Burning   Pain Type Chronic pain   Pain Onset 1 to 4 weeks ago   Pain Frequency Intermittent     Discussed with patient that he is able to walk on the beach next week, however he should progress slowly and make initial attempt at around 10-15 minutes to see if he has any additional pain, discontinue ambulating on beach.  Glute release with tennis ball x 30" for 2 bouts with cuing to position tennis ball in spot of discomfort and apply pressure, minimal movement encouraged.   Standing hip abductions with LLE only (to encourage isometric R hip abduction) 3 sets x 10 repetitions (no additional discomfort noted.   Clamshells in sidelying against manual resistance, progressed to yellow t-band for a total of 3 sets of 10 repetitions with 2-3" holds against resistance through tolerated ROM (increased from last week).   Inspected RLE, surgical incision appears to be healing well, notable quadricep atrophy remains, though appears to be improving.  PT Education - 05/04/16 0827    Education provided Yes   Education Details Progress with exercise program, but maintain protocol to decrease posterior R hip compression.    Person(s) Educated Patient   Methods Explanation   Comprehension Verbalized understanding             PT Long Term Goals - 04/13/16 1152    PT LONG TERM GOAL #1   Title Patient will report LEFS score of greater than 40/80 to demonstrate improved tolerance for ADLs.    Baseline 29/80   Time 6   Period Weeks   Status New   PT LONG TERM GOAL #2   Title Patient will demonstrate 5/5 MMT on R hip abductor test to demonstrate improved LE strength for ADL completion.    Baseline 4/5 on June 26.    Time 6   Period Weeks   Status New   PT LONG TERM GOAL #3   Title Patient will perform parallel squat with no HHA to demonstrate adequate LE  strength for ADLs.    Time 6   Period Weeks   Status New   PT LONG TERM GOAL #4   Title Patient will report worst pain on VAS of less than 3/10 to demonstrate improved tolerance for ADLs.    Baseline 8/10   Time 6   Period Weeks   Status New               Plan - 05/04/16 [id number]    Clinical Impression Statement Patient reports significant reduction in R posterior hip pain with protocol to decrease R posterior hip compression. Patient is able to tolerate more active exercises today with focus on strengthening in standing isometrically and using tennis ball over tender area in posterior R hip.    Rehab Potential Good   Clinical Impairments Affecting Rehab Potential Excellent progress with home health therapy thus far, age.    PT Frequency 2x / week   PT Duration 4 weeks   PT Treatment/Interventions Therapeutic exercise;Therapeutic activities;Manual techniques;Gait training;Stair training   PT Next Visit Plan Quad, hip abduction strengthening. Ankle DF ROM.    PT Home Exercise Plan Squats with weight, standing hip abductions, lateral band walks. Assess if supine piriformis stretch helped R buttock pain.-- piriformis stretch.    Consulted and Agree with Plan of Care Patient      Patient will benefit from skilled therapeutic intervention in order to improve the following deficits and impairments:  Abnormal gait, Difficulty walking, Pain  Visit Diagnosis: Difficulty in walking, not elsewhere classified  Pain in right knee     Problem List Patient Active Problem List   Diagnosis Date Noted  . Arthritis of shoulder region, degenerative 09/10/2015  . Clinical depression 07/22/2015  . Acid reflux 07/22/2015  . IBS (irritable bowel syndrome) 07/22/2015  . AK (actinic keratosis) 07/22/2015  . TIA (transient ischemic attack) 07/22/2015  . OSA (obstructive sleep apnea) 07/22/2015  . Idiopathic trigeminal neuralgia 07/22/2015  . CAD in native artery 07/22/2015  . GERD  (gastroesophageal reflux disease) 07/22/2015  . Allergic rhinitis 07/22/2015  . Arteriosclerosis of coronary artery 10/16/2014  . Essential (primary) hypertension 08/21/2014  . Combined fat and carbohydrate induced hyperlipemia 08/21/2014  . Heart attack ([place]) 08/13/2014  . Benign prostatic hypertrophy without urinary obstruction 01/16/2014  . Renal colic 0000000   Kerman Passey, PT, DPT    05/04/2016, 8:34 AM  [facility] [address],  [place], [address] Phone: [phone]   Fax:  [phone]  Name: Lajavion Dronen MRN: [id number] Date of Birth: 26-Sep-1946

## 2016-05-07 ENCOUNTER — Encounter: Payer: PPO | Admitting: Physical Therapy

## 2016-05-11 ENCOUNTER — Ambulatory Visit: Payer: PPO | Admitting: Physical Therapy

## 2016-05-11 DIAGNOSIS — M25561 Pain in right knee: Secondary | ICD-10-CM

## 2016-05-11 DIAGNOSIS — R262 Difficulty in walking, not elsewhere classified: Secondary | ICD-10-CM

## 2016-05-11 NOTE — Patient Instructions (Signed)
37.5 cm on LLE 40.5 cm on RLE  Sciatic nerve flossing initially on RLE with PT overpressure- felt stretching, no sharp increase in pain. However once he began trying to side step he noted sharp pain in RLE at knee, almost unable to bear weight.

## 2016-05-11 NOTE — Therapy (Signed)
[facility] [address] [place], [address] Phone: [phone]   Fax:  [phone]  Physical Therapy Treatment  Patient Details  Name: Timothy Singleton MRN: [id number] Date of Birth: 04-27-1946 No Data Recorded  Encounter Date: 05/11/2016      PT End of Session - 05/11/16 1146    Visit Number --   Number of Visits --   Date for PT Re-Evaluation 05/25/16   PT Start Time 1120   PT Stop Time 1159   PT Time Calculation (min) 39 min   Activity Tolerance Patient limited by pain   Behavior During Therapy [facility] for tasks assessed/performed      Past Medical History:  Diagnosis Date  . Arthritis   . Cancer ([place])    melanoma / knee  . Depression   . GERD (gastroesophageal reflux disease)   . Hyperlipemia   . Hypertension   . Past heart attack   . Sleep apnea     Past Surgical History:  Procedure Laterality Date  . BUNIONECTOMY    . CORONARY STENT PLACEMENT    . EXTRACORPOREAL SHOCK WAVE LITHOTRIPSY    . HERNIA REPAIR     inguinal-right  . KNEE SURGERY Right   . TONSILLECTOMY    . TOTAL SHOULDER REPLACEMENT    . UPPER GI ENDOSCOPY  10/18/01   hiatus hernia  . VASECTOMY    . WRIST SURGERY      There were no vitals filed for this visit.      Subjective Assessment - 05/11/16 1123    Subjective Patient reports his buttock symptoms seemed to have worsened over the week even though he was following protocol for not compressing/stressing gluteal musculature. Patient also reporting pain developing in lateral inferior patellar region, aggravated with lifting into bed.    Patient is accompained by: Family member   Pertinent History Patient had R TKR on 03/23/2016. Prior to that he had a melanoma excised on R quad ~ 5 weeks prior to TKR.    Limitations Walking;House hold activities   Patient Stated Goals To return to his usual hobbies like golf.    Currently in Pain? Yes   Pain Score --  Reports pain  in his buttock and lateral distal patella (anterior) which appears to be worsening.   Pain Location Buttocks   Pain Orientation Right   Pain Descriptors / Indicators Aching   Pain Type Chronic pain   Pain Radiating Towards Lateral distal patella    Pain Onset 1 to 4 weeks ago   Pain Frequency Intermittent   Aggravating Factors  Sitting in the car, lifting his leg up into bed.       37.5 cm on LLE 40.5 cm on RLE  Sciatic nerve flossing initially on RLE with PT overpressure- felt stretching, no sharp increase in pain. However once he began trying to side step he noted sharp pain in RLE at knee, almost unable to bear weight.                             PT Education - 05/11/16 1321    Education provided Yes   Education Details Patient needs to contact MD regarding chills, pain, swelling in RLE.   Person(s) Educated Patient   Methods Explanation   Comprehension Verbalized understanding             PT Long Term Goals - 04/13/16 1152  PT LONG TERM GOAL #1   Title Patient will report LEFS score of greater than 40/80 to demonstrate improved tolerance for ADLs.    Baseline 29/80   Time 6   Period Weeks   Status New     PT LONG TERM GOAL #2   Title Patient will demonstrate 5/5 MMT on R hip abductor test to demonstrate improved LE strength for ADL completion.    Baseline 4/5 on June 26.    Time 6   Period Weeks   Status New     PT LONG TERM GOAL #3   Title Patient will perform parallel squat with no HHA to demonstrate adequate LE strength for ADLs.    Time 6   Period Weeks   Status New     PT LONG TERM GOAL #4   Title Patient will report worst pain on VAS of less than 3/10 to demonstrate improved tolerance for ADLs.    Baseline 8/10   Time 6   Period Weeks   Status New               Plan - 05/11/16 1150    Clinical Impression Statement Patient reports pain increase in lateral inferior patellar region over the past week, with increase  in swelling and report of chills. He demonstrates antalgic gait pattern, which he has not demonstrated in previous sessions and after sciatic nerve flossing has difficulty bearing weight (though this appeared temporary). Given his recent surgery and this change, no further tx provided, contacted MD office and left message regarding the noted symptoms. Instructed patient to call MD office today.    Rehab Potential Good   Clinical Impairments Affecting Rehab Potential Excellent progress with home health therapy thus far, age.    PT Frequency 2x / week   PT Duration 4 weeks   PT Treatment/Interventions Therapeutic exercise;Therapeutic activities;Manual techniques;Gait training;Stair training   PT Next Visit Plan Quad, hip abduction strengthening. Ankle DF ROM.    PT Home Exercise Plan Squats with weight, standing hip abductions, lateral band walks. Assess if supine piriformis stretch helped R buttock pain.-- piriformis stretch.    Consulted and Agree with Plan of Care Patient      Patient will benefit from skilled therapeutic intervention in order to improve the following deficits and impairments:  Abnormal gait, Difficulty walking, Pain  Visit Diagnosis: Difficulty in walking, not elsewhere classified  Pain in right knee     Problem List Patient Active Problem List   Diagnosis Date Noted  . Arthritis of shoulder region, degenerative 09/10/2015  . Clinical depression 07/22/2015  . Acid reflux 07/22/2015  . IBS (irritable bowel syndrome) 07/22/2015  . AK (actinic keratosis) 07/22/2015  . TIA (transient ischemic attack) 07/22/2015  . OSA (obstructive sleep apnea) 07/22/2015  . Idiopathic trigeminal neuralgia 07/22/2015  . CAD in native artery 07/22/2015  . GERD (gastroesophageal reflux disease) 07/22/2015  . Allergic rhinitis 07/22/2015  . Arteriosclerosis of coronary artery 10/16/2014  . Essential (primary) hypertension 08/21/2014  . Combined fat and carbohydrate induced  hyperlipemia 08/21/2014  . Heart attack ([place]) 08/13/2014  . Benign prostatic hypertrophy without urinary obstruction 01/16/2014  . Renal colic 0000000   Kerman Passey, PT, DPT    05/12/2016, 1:22 PM  [facility] [address] [place], [address] Phone: [phone]   Fax:  [phone]  Name: Dymon Dulin MRN: [id number] Date of Birth: [birth year]

## 2016-05-14 ENCOUNTER — Ambulatory Visit: Payer: PPO | Admitting: Physical Therapy

## 2016-05-14 DIAGNOSIS — M25561 Pain in right knee: Secondary | ICD-10-CM

## 2016-05-14 DIAGNOSIS — R262 Difficulty in walking, not elsewhere classified: Secondary | ICD-10-CM | POA: Diagnosis not present

## 2016-05-14 NOTE — Therapy (Signed)
[facility] [address], [place], [address] Phone: [phone]   Fax:  [phone]  Physical Therapy Treatment  Patient Details  Name: Timothy Singleton MRN: [id number] Date of Birth: 1946-08-06 No Data Recorded  Encounter Date: 05/14/2016      PT End of Session - 05/14/16 1200    Visit Number 7   Number of Visits 9   PT Start Time [id number]   PT Stop Time 1200   PT Time Calculation (min) 43 min   Activity Tolerance Patient tolerated treatment well   Behavior During Therapy [facility] for tasks assessed/performed      Past Medical History:  Diagnosis Date  . Arthritis   . Cancer ([place])    melanoma / knee  . Depression   . GERD (gastroesophageal reflux disease)   . Hyperlipemia   . Hypertension   . Past heart attack   . Sleep apnea     Past Surgical History:  Procedure Laterality Date  . BUNIONECTOMY    . CORONARY STENT PLACEMENT    . EXTRACORPOREAL SHOCK WAVE LITHOTRIPSY    . HERNIA REPAIR     inguinal-right  . KNEE SURGERY Right   . TONSILLECTOMY    . TOTAL SHOULDER REPLACEMENT    . UPPER GI ENDOSCOPY  10/18/01   hiatus hernia  . VASECTOMY    . WRIST SURGERY      There were no vitals filed for this visit.      Subjective Assessment - 05/14/16 1134    Subjective Patient reports he saw the PA on Monday and Surgeon yesterday, workup thus far has been negative and the sciatic symptoms he is reporting aren't unusual per his physician. Patient reports he is having less pain and swelling in the knee today and has been taking an anti-inflammatory.   Patient is accompained by: Family member   Pertinent History Patient had R TKR on 03/23/2016. Prior to that he had a melanoma excised on R quad ~ 5 weeks prior to TKR.    Limitations Walking;House hold activities   Patient Stated Goals To return to his usual hobbies like golf.    Currently in Pain? Yes   Pain Score --  Pain in his R hip (reduced  possibly due to anti-inflammatories), pain in lateral anterior knee is also reduced.    Pain Orientation Right   Pain Descriptors / Indicators Aching   Pain Type Chronic pain   Pain Onset 1 to 4 weeks ago   Pain Frequency Intermittent      SLR x 15 x3# , x5# for 10 repetitions for 2 sets   Manual A-P mobilizations with quad sets x 6 repetitions to facilitate full knee extension   Leg Press 45# x 10 repetitions for 3 sets on RLE only  TKE x 12 with blue theraband - no pain, difficulty noted   TKEs during gait to promote quadricep contraction and full extension at mid-stance  4 bouts x 60' ambulation.   Sit to stands x 10 with 20# KB x 2 sets   Suitcase carries x 60' with 20# KB bilaterally                            PT Education - 05/14/16 1433    Education provided Yes   Education Details Continue with elevation or compression to reduce swelling, calf stretching and symptom observation over the weekend.  Person(s) Educated Patient   Methods Explanation   Comprehension Verbalized understanding             PT Long Term Goals - 04/13/16 1152      PT LONG TERM GOAL #1   Title Patient will report LEFS score of greater than 40/80 to demonstrate improved tolerance for ADLs.    Baseline 29/80   Time 6   Period Weeks   Status New     PT LONG TERM GOAL #2   Title Patient will demonstrate 5/5 MMT on R hip abductor test to demonstrate improved LE strength for ADL completion.    Baseline 4/5 on June 26.    Time 6   Period Weeks   Status New     PT LONG TERM GOAL #3   Title Patient will perform parallel squat with no HHA to demonstrate adequate LE strength for ADLs.    Time 6   Period Weeks   Status New     PT LONG TERM GOAL #4   Title Patient will report worst pain on VAS of less than 3/10 to demonstrate improved tolerance for ADLs.    Baseline 8/10   Time 6   Period Weeks   Status New               Plan - 06-02-16 1155     Clinical Impression Statement Patient is demonstrating less swelling 40cm as opposed to 40.5cm in R knee today. He is able to return to strengthening protocol today, notable medial quadricep atrophy still. Patient reporting less hip pain as well, he attributes to his anti-inflammatory. He is progressing well today, will likely need additional strengthening of RLE given the atrophy that continues to be noted. Will need to continue to focus on lack of terminal knee extension in mid-stance (has it passively).    Rehab Potential Good   Clinical Impairments Affecting Rehab Potential Excellent progress with home health therapy thus far, age.    PT Frequency 2x / week   PT Duration 4 weeks   PT Treatment/Interventions Therapeutic exercise;Therapeutic activities;Manual techniques;Gait training;Stair training   PT Next Visit Plan Quad, hip abduction strengthening. Ankle DF ROM.    PT Home Exercise Plan Squats with weight, standing hip abductions, lateral band walks. Assess if supine piriformis stretch helped R buttock pain.-- piriformis stretch.    Consulted and Agree with Plan of Care Patient      Patient will benefit from skilled therapeutic intervention in order to improve the following deficits and impairments:  Abnormal gait, Difficulty walking, Pain  Visit Diagnosis: Difficulty in walking, not elsewhere classified  Pain in right knee       G-Codes - 02-Jun-2016 1438    Functional Assessment Tool Used Patient report   Functional Limitation Mobility: Walking and moving around   Mobility: Walking and Moving Around Current Status [id number]) At least 1 percent but less than 20 percent impaired, limited or restricted   Mobility: Walking and Moving Around Goal Status [id number]) 0 percent impaired, limited or restricted      Problem List Patient Active Problem List   Diagnosis Date Noted  . Arthritis of shoulder region, degenerative 09/10/2015  . Clinical depression 07/22/2015  . Acid reflux  07/22/2015  . IBS (irritable bowel syndrome) 07/22/2015  . AK (actinic keratosis) 07/22/2015  . TIA (transient ischemic attack) 07/22/2015  . OSA (obstructive sleep apnea) 07/22/2015  . Idiopathic trigeminal neuralgia 07/22/2015  . CAD in native artery 07/22/2015  . GERD (gastroesophageal reflux  disease) 07/22/2015  . Allergic rhinitis 07/22/2015  . Arteriosclerosis of coronary artery 10/16/2014  . Essential (primary) hypertension 08/21/2014  . Combined fat and carbohydrate induced hyperlipemia 08/21/2014  . Heart attack ([place]) 08/13/2014  . Benign prostatic hypertrophy without urinary obstruction 01/16/2014  . Renal colic 0000000   Kerman Passey, PT, DPT     05/14/2016, 2:39 PM  [facility] [address] [place], [address] Phone: [phone]   Fax:  [phone]  Name: Timothy Singleton MRN: [id number] Date of Birth: [birth year]

## 2016-05-18 ENCOUNTER — Ambulatory Visit: Payer: PPO | Admitting: Physical Therapy

## 2016-05-18 DIAGNOSIS — M25561 Pain in right knee: Secondary | ICD-10-CM

## 2016-05-18 DIAGNOSIS — R262 Difficulty in walking, not elsewhere classified: Secondary | ICD-10-CM

## 2016-05-18 NOTE — Therapy (Signed)
[place] [facility] [address], [place], [address] Phone: [phone]   Fax:  [phone]  Physical Therapy Treatment  Patient Details  Name: Timothy Singleton MRN: [id number] Date of Birth: 1946/06/10 No Data Recorded  Encounter Date: 05/18/2016      PT End of Session - 05/18/16 1121    Visit Number 8   Number of Visits 15   Date for PT Re-Evaluation 06/22/16   PT Start Time 1120   PT Stop Time 1204   PT Time Calculation (min) 44 min   Activity Tolerance Patient tolerated treatment well   Behavior During Therapy [facility] for tasks assessed/performed      Past Medical History:  Diagnosis Date  . Arthritis   . Cancer ([place])    melanoma / knee  . Depression   . GERD (gastroesophageal reflux disease)   . Hyperlipemia   . Hypertension   . Past heart attack   . Sleep apnea     Past Surgical History:  Procedure Laterality Date  . BUNIONECTOMY    . CORONARY STENT PLACEMENT    . EXTRACORPOREAL SHOCK WAVE LITHOTRIPSY    . HERNIA REPAIR     inguinal-right  . KNEE SURGERY Right   . TONSILLECTOMY    . TOTAL SHOULDER REPLACEMENT    . UPPER GI ENDOSCOPY  10/18/01   hiatus hernia  . VASECTOMY    . WRIST SURGERY      There were no vitals filed for this visit.      Subjective Assessment - 05/18/16 1121    Subjective Patient reports his R hip is feeling better, though he has difficulty sitting on hard surfaces for prolonged periods. His R knee is improving as well.     Patient is accompained by: Family member   Pertinent History Patient had R TKR on 03/23/2016. Prior to that he had a melanoma excised on R quad ~ 5 weeks prior to TKR.    Limitations Walking;House hold activities   Patient Stated Goals To return to his usual hobbies like golf.    Pain Onset 1 to 4 weeks ago      LEFS 52/80  Able to squat to parallel without pain  Leg Press 55# x 9, x8, x 8 on RLE only.   Squats to KB on floor (20#)  able to complete x 5 without pain in leg.   SLRs x 10 repetitions - 5# (easy), 10 repetitions for 7.5# for 2 sets RLE only.    Leg extension x 20# for 12 repetitions x 10 repetitions, x 8 repetitions on RLE only.   Side step ups on RLE x 12 for 3 sets with 2 risers (difficulty initially with performing knee extension in closed chain, relieved with multiple bouts until last 2-3 reps which were slightly more sore).                            PT Education - 05/18/16 1700    Education provided Yes   Education Details Perform strengthening exercises x 3 times per week.    Person(s) Educated Patient   Methods Explanation   Comprehension Verbalized understanding             PT Long Term Goals - 05/18/16 1122      PT LONG TERM GOAL #1   Title Patient will report LEFS score of greater than 40/80 to demonstrate improved tolerance for ADLs.  Baseline 29/80 at baseline, 52/80 on 7/31   Time 6   Period Weeks   Status Achieved     PT LONG TERM GOAL #2   Title Patient will demonstrate 5/5 MMT on R hip abductor test to demonstrate improved LE strength for ADL completion.    Baseline 4/5 on June 26.    Time 6   Period Weeks   Status Achieved     PT LONG TERM GOAL #3   Title Patient will perform parallel squat with no HHA to demonstrate adequate LE strength for ADLs.    Baseline Mild pain in butt at bottom.    Time 6   Period Weeks   Status Achieved     PT LONG TERM GOAL #4   Title Patient will report worst pain on VAS of less than 3/10 to demonstrate improved tolerance for ADLs. on 7/31 - worst is a temporary 3/10 shooting pain.    Baseline 8/10   Time 6   Period Weeks   Status Achieved     PT LONG TERM GOAL #5   Title Patient will report LEFS score greater than 61/80 to demonstrate improved tolerance for ADLs.    Baseline 52/80 on 7/31   Time 4   Period Weeks   Status New               Plan - 05/18/16 1122    Clinical Impression Statement  Patient reporting decline in hip and knee pain this week, he has been diligent with HEP. Patient is able to complete full squat, mild rounding of low spine but able to lift 20# off the floor without increase in pain. He is able to tolerate quadricep strengthening program initiated today, will monitor for residual soreness and continues to struggle with end range knee extension in closed chain.    Rehab Potential Good   Clinical Impairments Affecting Rehab Potential Excellent progress with home health therapy thus far, age.    PT Frequency 2x / week   PT Duration 4 weeks   PT Treatment/Interventions Therapeutic exercise;Therapeutic activities;Manual techniques;Gait training;Stair training   PT Next Visit Plan Quad, hip abduction strengthening. Ankle DF ROM.    PT Home Exercise Plan Squats with weight, standing hip abductions, lateral band walks. Assess if supine piriformis stretch helped R buttock pain.-- piriformis stretch.    Consulted and Agree with Plan of Care Patient      Patient will benefit from skilled therapeutic intervention in order to improve the following deficits and impairments:  Abnormal gait, Difficulty walking, Pain  Visit Diagnosis: Difficulty in walking, not elsewhere classified - Plan: PT plan of care cert/re-cert  Pain in right knee - Plan: PT plan of care cert/re-cert     Problem List Patient Active Problem List   Diagnosis Date Noted  . Arthritis of shoulder region, degenerative 09/10/2015  . Clinical depression 07/22/2015  . Acid reflux 07/22/2015  . IBS (irritable bowel syndrome) 07/22/2015  . AK (actinic keratosis) 07/22/2015  . TIA (transient ischemic attack) 07/22/2015  . OSA (obstructive sleep apnea) 07/22/2015  . Idiopathic trigeminal neuralgia 07/22/2015  . CAD in native artery 07/22/2015  . GERD (gastroesophageal reflux disease) 07/22/2015  . Allergic rhinitis 07/22/2015  . Arteriosclerosis of coronary artery 10/16/2014  . Essential (primary)  hypertension 08/21/2014  . Combined fat and carbohydrate induced hyperlipemia 08/21/2014  . Heart attack ([place]) 08/13/2014  . Benign prostatic hypertrophy without urinary obstruction 01/16/2014  . Renal colic 0000000   Kerman Passey, PT, DPT  05/18/2016, 5:08 PM  [place] [facility] [address], [place], [address] Phone: [phone]   Fax:  [phone]  Name: Timothy Singleton MRN: [id number] Date of Birth: May 17, 1946

## 2016-05-18 NOTE — Patient Instructions (Addendum)
LEFS 52/80  Able to squat to parallel without pain  Leg Press 55# x 9, x8, x 8 on RLE only.   Squats to KB on floor (20#) able to complete x 5 without pain in leg.   SLRs x 10 repetitions - 5# (easy), 10 repetitions for 7.5# for 2 sets RLE only.    Leg extension x 20# for 12 repetitions x 10 repetitions, x 8 repetitions on RLE only.   Side step ups on RLE x 12 for 3 sets with 2 risers (difficulty initially with performing knee extension in closed chain, relieved with multiple bouts until last 2-3 reps which were slightly more sore).

## 2016-05-21 ENCOUNTER — Ambulatory Visit: Payer: PPO | Attending: Orthopedic Surgery | Admitting: Physical Therapy

## 2016-05-21 DIAGNOSIS — R262 Difficulty in walking, not elsewhere classified: Secondary | ICD-10-CM | POA: Diagnosis not present

## 2016-05-21 DIAGNOSIS — M25561 Pain in right knee: Secondary | ICD-10-CM

## 2016-05-21 NOTE — Patient Instructions (Signed)
SLRs with 7.5# ankle weight on RLE x 12, x 10 progressed to 10# ankle weight x 8, x8  Side stepping with red t-band (progressed to green after 1st bout too [place]) x 10 for 2 bouts for 2 sets bilaterally   Gait training and assessment with use of video recording. Patient noted to land on underside of heel with flexed knee requiring him to actively extend/flex/extend however he never gets to full extension in midstance initially. Used blue t-band in heel strike to encourage extension as well as visual feedback from video. Patient then educated on extension in midstance both with video and blue t-band providing additional stimulus, he was able to improve on video analysis afterwards.   TKEs with blue t-band 3 sets x 30 repetitions on RLE.

## 2016-05-21 NOTE — Therapy (Signed)
[place] [facility] [address], [place], [address] Phone: [phone]   Fax:  [phone]  Physical Therapy Treatment  Patient Details  Name: Timothy Singleton MRN: [id number] Date of Birth: 11-28-1945 No Data Recorded  Encounter Date: 05/21/2016      PT End of Session - 05/21/16 1359    Visit Number 9   Number of Visits 15   Date for PT Re-Evaluation 06/22/16   PT Start Time 1116   PT Stop Time 1158   PT Time Calculation (min) 42 min   Activity Tolerance Patient tolerated treatment well   Behavior During Therapy [facility] for tasks assessed/performed      Past Medical History:  Diagnosis Date  . Arthritis   . Cancer ([place])    melanoma / knee  . Depression   . GERD (gastroesophageal reflux disease)   . Hyperlipemia   . Hypertension   . Past heart attack   . Sleep apnea     Past Surgical History:  Procedure Laterality Date  . BUNIONECTOMY    . CORONARY STENT PLACEMENT    . EXTRACORPOREAL SHOCK WAVE LITHOTRIPSY    . HERNIA REPAIR     inguinal-right  . KNEE SURGERY Right   . TONSILLECTOMY    . TOTAL SHOULDER REPLACEMENT    . UPPER GI ENDOSCOPY  10/18/01   hiatus hernia  . VASECTOMY    . WRIST SURGERY      There were no vitals filed for this visit.      Subjective Assessment - 05/21/16 1117    Subjective Patient reports he has a little twinge in his R knee, hip pain is reducing but still noticeable.    Patient is accompained by: Family member   Pertinent History Patient had R TKR on 03/23/2016. Prior to that he had a melanoma excised on R quad ~ 5 weeks prior to TKR.    Limitations Walking;House hold activities   Patient Stated Goals To return to his usual hobbies like golf.    Currently in Pain? Yes   Pain Score --  Reports he has a tingle in his R knee. Hip is sore, but getting better.    Pain Location Knee   Pain Orientation Right   Pain Descriptors / Indicators Aching   Pain Type Surgical  pain   Pain Onset 1 to 4 weeks ago   Pain Frequency Intermittent   Aggravating Factors  Sitting in the car.    Pain Relieving Factors Rest       SLRs with 7.5# ankle weight on RLE x 12, x 10 progressed to 10# ankle weight x 8, x8  Side stepping with red t-band (progressed to green after 1st bout too [place]) x 10 for 2 bouts for 2 sets bilaterally   Gait training and assessment with use of video recording. Patient noted to land on underside of heel with flexed knee requiring him to actively extend/flex/extend however he never gets to full extension in midstance initially. Used blue t-band in heel strike to encourage extension as well as visual feedback from video. Patient then educated on extension in midstance both with video and blue t-band providing additional stimulus, he was able to improve on video analysis afterwards.   TKEs with blue t-band 3 sets x 30 repetitions on RLE.  PT Education - 05/21/16 1629    Education provided Yes   Education Details Progression to discharge (quad hypertrophy, full extension in gait)    Person(s) Educated Patient   Methods Explanation   Comprehension Verbalized understanding             PT Long Term Goals - 05/18/16 1122      PT LONG TERM GOAL #1   Title Patient will report LEFS score of greater than 40/80 to demonstrate improved tolerance for ADLs.    Baseline 29/80 at baseline, 52/80 on 7/31   Time 6   Period Weeks   Status Achieved     PT LONG TERM GOAL #2   Title Patient will demonstrate 5/5 MMT on R hip abductor test to demonstrate improved LE strength for ADL completion.    Baseline 4/5 on June 26.    Time 6   Period Weeks   Status Achieved     PT LONG TERM GOAL #3   Title Patient will perform parallel squat with no HHA to demonstrate adequate LE strength for ADLs.    Baseline Mild pain in butt at bottom.    Time 6   Period Weeks   Status Achieved     PT LONG TERM GOAL #4    Title Patient will report worst pain on VAS of less than 3/10 to demonstrate improved tolerance for ADLs. on 7/31 - worst is a temporary 3/10 shooting pain.    Baseline 8/10   Time 6   Period Weeks   Status Achieved     PT LONG TERM GOAL #5   Title Patient will report LEFS score greater than 61/80 to demonstrate improved tolerance for ADLs.    Baseline 52/80 on 7/31   Time 4   Period Weeks   Status New               Plan - 05/21/16 1359    Clinical Impression Statement Patient is initially demonstrating flexion at heel strike, used video analysis and band in stance phase to facilitate extension in weightbearing which promoted extension for him. He was able to perform more reciprocal normalized gait pattern with extension at heel strike and midstance afterwards. Patient educated to focus on this as part of HEP to promote long term relief.    Rehab Potential Good   Clinical Impairments Affecting Rehab Potential Excellent progress with home health therapy thus far, age.    PT Frequency 2x / week   PT Duration 4 weeks   PT Treatment/Interventions Therapeutic exercise;Therapeutic activities;Manual techniques;Gait training;Stair training   PT Next Visit Plan Quad, hip abduction strengthening. Ankle DF ROM.    PT Home Exercise Plan Squats with weight, standing hip abductions, lateral band walks. Assess if supine piriformis stretch helped R buttock pain.-- piriformis stretch.    Consulted and Agree with Plan of Care Patient      Patient will benefit from skilled therapeutic intervention in order to improve the following deficits and impairments:  Abnormal gait, Difficulty walking, Pain  Visit Diagnosis: Difficulty in walking, not elsewhere classified  Pain in right knee     Problem List Patient Active Problem List   Diagnosis Date Noted  . Arthritis of shoulder region, degenerative 09/10/2015  . Clinical depression 07/22/2015  . Acid reflux 07/22/2015  . IBS (irritable  bowel syndrome) 07/22/2015  . AK (actinic keratosis) 07/22/2015  . TIA (transient ischemic attack) 07/22/2015  . OSA (obstructive sleep apnea) 07/22/2015  . Idiopathic trigeminal neuralgia 07/22/2015  .  CAD in native artery 07/22/2015  . GERD (gastroesophageal reflux disease) 07/22/2015  . Allergic rhinitis 07/22/2015  . Arteriosclerosis of coronary artery 10/16/2014  . Essential (primary) hypertension 08/21/2014  . Combined fat and carbohydrate induced hyperlipemia 08/21/2014  . Heart attack ([place]) 08/13/2014  . Benign prostatic hypertrophy without urinary obstruction 01/16/2014  . Renal colic 0000000   Kerman Passey, PT, DPT    05/21/2016, 4:31 PM  [facility] [address], [place], [address] Phone: [phone]   Fax:  [phone]  Name: Huan Imbert MRN: [id number] Date of Birth: [birth year]

## 2016-05-25 ENCOUNTER — Ambulatory Visit: Payer: PPO

## 2016-05-25 DIAGNOSIS — R262 Difficulty in walking, not elsewhere classified: Secondary | ICD-10-CM | POA: Diagnosis not present

## 2016-05-25 DIAGNOSIS — M25561 Pain in right knee: Secondary | ICD-10-CM

## 2016-05-25 NOTE — Therapy (Signed)
[place] [facility] [address], [place], [address] Phone: [phone]   Fax:  [phone]  Physical Therapy Treatment  Patient Details  Name: Timothy Singleton MRN: [id number] Date of Birth: Dec 05, 1945 No Data Recorded  Encounter Date: 05/25/2016      PT End of Session - 05/25/16 1119    Visit Number 10   Number of Visits 15   Date for PT Re-Evaluation 06/22/16   PT Start Time 1118   PT Stop Time 1200   PT Time Calculation (min) 42 min   Activity Tolerance Patient tolerated treatment well   Behavior During Therapy [facility] for tasks assessed/performed      Past Medical History:  Diagnosis Date  . Arthritis   . Cancer ([place])    melanoma / knee  . Depression   . GERD (gastroesophageal reflux disease)   . Hyperlipemia   . Hypertension   . Past heart attack   . Sleep apnea     Past Surgical History:  Procedure Laterality Date  . BUNIONECTOMY    . CORONARY STENT PLACEMENT    . EXTRACORPOREAL SHOCK WAVE LITHOTRIPSY    . HERNIA REPAIR     inguinal-right  . KNEE SURGERY Right   . TONSILLECTOMY    . TOTAL SHOULDER REPLACEMENT    . UPPER GI ENDOSCOPY  10/18/01   hiatus hernia  . VASECTOMY    . WRIST SURGERY      There were no vitals filed for this visit.      Subjective Assessment - 05/25/16 1118    Subjective Pt reports he is doing well at this time. Denies knee pain currently but reports posterior R hip pain. HEP is going well. No specific questions or concerns.    Patient is accompained by: Family member   Pertinent History Patient had R TKR on 03/23/2016. Prior to that he had a melanoma excised on R quad ~ 5 weeks prior to TKR.    Limitations Walking;House hold activities   Patient Stated Goals To return to his usual hobbies like golf.    Currently in Pain? Yes   Pain Score 4    Pain Location Hip   Pain Orientation Right;Posterior   Pain Descriptors / Indicators Burning   Pain Type Chronic pain    Pain Onset 1 to 4 weeks ago   Pain Frequency Intermittent       TREATMENT   THER-EX SLRs with 7.5# ankle weight on RLE x 15, progressed to 10# ankle weight 2 x 12; TG RLE single leg squats setting 26, 3 x 10; TG RLE single leg heel raise setting 26, 3 x 10; OMEGA single leg press 55# 3 x 10; Side stepping with green t-band 60' x 3;  TKEs with green t-band 3 sets x 30 repetitions each on RLE.   Supine R knee to chest stretch 30 seconds x 2; Supine figure 4 stretch, terminated due to R hip pain; Supine R piriformis stretch 30 seconds x 2;                         PT Education - 05/25/16 1118    Education provided Yes   Education Details Reinforced HEP and plan of care   Person(s) Educated Patient   Methods Explanation   Comprehension Verbalized understanding             PT Long Term Goals - 05/18/16 1122  PT LONG TERM GOAL #1   Title Patient will report LEFS score of greater than 40/80 to demonstrate improved tolerance for ADLs.    Baseline 29/80 at baseline, 52/80 on 7/31   Time 6   Period Weeks   Status Achieved     PT LONG TERM GOAL #2   Title Patient will demonstrate 5/5 MMT on R hip abductor test to demonstrate improved LE strength for ADL completion.    Baseline 4/5 on June 26.    Time 6   Period Weeks   Status Achieved     PT LONG TERM GOAL #3   Title Patient will perform parallel squat with no HHA to demonstrate adequate LE strength for ADLs.    Baseline Mild pain in butt at bottom.    Time 6   Period Weeks   Status Achieved     PT LONG TERM GOAL #4   Title Patient will report worst pain on VAS of less than 3/10 to demonstrate improved tolerance for ADLs. on 7/31 - worst is a temporary 3/10 shooting pain.    Baseline 8/10   Time 6   Period Weeks   Status Achieved     PT LONG TERM GOAL #5   Title Patient will report LEFS score greater than 61/80 to demonstrate improved tolerance for ADLs.    Baseline 52/80 on 7/31    Time 4   Period Weeks   Status New               Plan - 05/25/16 1119    Clinical Impression Statement Pt demonstrates improved strength with weighted SLR on this date. He continues to demonstrates decreased R knee extension at heel strike and mild swelling around R knee. R quad atrophy noted. Will continue to progress RLE strengthening and gait training. Pt encouraged to continue HEP and follow-up as scheduled.    Rehab Potential Good   Clinical Impairments Affecting Rehab Potential Excellent progress with home health therapy thus far, age.    PT Frequency 2x / week   PT Duration 4 weeks   PT Treatment/Interventions Therapeutic exercise;Therapeutic activities;Manual techniques;Gait training;Stair training   PT Next Visit Plan Quad, hip abduction strengthening. Ankle DF ROM.    PT Home Exercise Plan Squats with weight, standing hip abductions, lateral band walks. Assess if supine piriformis stretch helped R buttock pain.-- piriformis stretch.    Consulted and Agree with Plan of Care Patient      Patient will benefit from skilled therapeutic intervention in order to improve the following deficits and impairments:  Abnormal gait, Difficulty walking, Pain  Visit Diagnosis: Difficulty in walking, not elsewhere classified  Pain in right knee     Problem List Patient Active Problem List   Diagnosis Date Noted  . Arthritis of shoulder region, degenerative 09/10/2015  . Clinical depression 07/22/2015  . Acid reflux 07/22/2015  . IBS (irritable bowel syndrome) 07/22/2015  . AK (actinic keratosis) 07/22/2015  . TIA (transient ischemic attack) 07/22/2015  . OSA (obstructive sleep apnea) 07/22/2015  . Idiopathic trigeminal neuralgia 07/22/2015  . CAD in native artery 07/22/2015  . GERD (gastroesophageal reflux disease) 07/22/2015  . Allergic rhinitis 07/22/2015  . Arteriosclerosis of coronary artery 10/16/2014  . Essential (primary) hypertension 08/21/2014  . Combined fat and  carbohydrate induced hyperlipemia 08/21/2014  . Heart attack ([place]) 08/13/2014  . Benign prostatic hypertrophy without urinary obstruction 01/16/2014  . Renal colic 0000000   Lyndel Safe Huprich PT, DPT   Huprich,Jason 05/25/2016, 12:40 PM  [place] [facility] [address], [place], [address] Phone: [phone]   Fax:  [phone]  Name: Timothy Singleton MRN: [id number] Date of Birth: 1946/02/13

## 2016-05-28 ENCOUNTER — Ambulatory Visit: Payer: PPO

## 2016-05-28 DIAGNOSIS — M25561 Pain in right knee: Secondary | ICD-10-CM

## 2016-05-28 DIAGNOSIS — R262 Difficulty in walking, not elsewhere classified: Secondary | ICD-10-CM | POA: Diagnosis not present

## 2016-05-28 NOTE — Therapy (Signed)
[facility] [address] [place], [address] Phone: [phone]   Fax:  [phone]  Physical Therapy Treatment  Patient Details  Name: Timothy Singleton MRN: [id number] Date of Birth: 1946/01/10 No Data Recorded  Encounter Date: 05/28/2016      PT End of Session - 05/28/16 1121    Visit Number 11   Number of Visits 15   Date for PT Re-Evaluation 06/22/16   PT Start Time 1117   PT Stop Time 1200   PT Time Calculation (min) 43 min   Activity Tolerance Patient tolerated treatment well   Behavior During Therapy [facility] for tasks assessed/performed      Past Medical History:  Diagnosis Date  . Arthritis   . Cancer ([place])    melanoma / knee  . Depression   . GERD (gastroesophageal reflux disease)   . Hyperlipemia   . Hypertension   . Past heart attack   . Sleep apnea     Past Surgical History:  Procedure Laterality Date  . BUNIONECTOMY    . CORONARY STENT PLACEMENT    . EXTRACORPOREAL SHOCK WAVE LITHOTRIPSY    . HERNIA REPAIR     inguinal-right  . KNEE SURGERY Right   . TONSILLECTOMY    . TOTAL SHOULDER REPLACEMENT    . UPPER GI ENDOSCOPY  10/18/01   hiatus hernia  . VASECTOMY    . WRIST SURGERY      There were no vitals filed for this visit.      Subjective Assessment - 05/28/16 1119    Subjective Pt states that he is doing well on this date. He denies increased soreness following last therapy session. Pt does complain of some R lateral hip pain which just started this morning. He currently reports 2/10 pain in lateral hip and 3/10 posterior R hip pain which worsens with sitting.    Patient is accompained by: Family member   Pertinent History Patient had R TKR on 03/23/2016. Prior to that he had a melanoma excised on R quad ~ 5 weeks prior to TKR.    Limitations Walking;House hold activities   Patient Stated Goals To return to his usual hobbies like golf.    Currently in Pain? Yes   Pain  Score 3    Pain Location Hip   Pain Orientation Right;Posterior   Pain Descriptors / Indicators Burning   Pain Type Chronic pain   Pain Radiating Towards Lateral distal patella   Pain Onset 1 to 4 weeks ago   Pain Frequency Intermittent   Aggravating Factors  Sitting for extended periods   Pain Relieving Factors Rest        TREATMENT   THER-EX Supine R knee to chest stretch 30 seconds x 2;  Supine figure 4 stretch, terminated due to R hip pain;  Supine R piriformis stretch 30 seconds x 2;  L sidelying R hip abduction (4-/5 strength) x 10, pt reports increase in R posterior hip pain at end of set;  L sidelying isometric R hip abduction 10 second hold x 5, x 2 sets, pt denies any posterior R hip pain;  TG RLE single leg squats setting 26, 3 x 10;  TG RLE single leg heel raise setting 26, 3 x 10;  Side stepping with squats using green t-band x 75';   TKEs with green t-band 3 sets x 30 repetitions each on RLE.     Prostretch bilateral calf stretch 30 second hold x  3;  Standing R soleus stretch 30 second hold x 3;                        PT Education - 05/28/16 1121    Education provided Yes   Education Details Reinforced HEP   Person(s) Educated Patient   Methods Explanation   Comprehension Verbalized understanding             PT Long Term Goals - 05/18/16 1122      PT LONG TERM GOAL #1   Title Patient will report LEFS score of greater than 40/80 to demonstrate improved tolerance for ADLs.    Baseline 29/80 at baseline, 52/80 on 7/31   Time 6   Period Weeks   Status Achieved     PT LONG TERM GOAL #2   Title Patient will demonstrate 5/5 MMT on R hip abductor test to demonstrate improved LE strength for ADL completion.    Baseline 4/5 on June 26.    Time 6   Period Weeks   Status Achieved     PT LONG TERM GOAL #3   Title Patient will perform parallel squat with no HHA to demonstrate adequate LE strength for ADLs.    Baseline Mild  pain in butt at bottom.    Time 6   Period Weeks   Status Achieved     PT LONG TERM GOAL #4   Title Patient will report worst pain on VAS of less than 3/10 to demonstrate improved tolerance for ADLs. on 7/31 - worst is a temporary 3/10 shooting pain.    Baseline 8/10   Time 6   Period Weeks   Status Achieved     PT LONG TERM GOAL #5   Title Patient will report LEFS score greater than 61/80 to demonstrate improved tolerance for ADLs.    Baseline 52/80 on 7/31   Time 4   Period Weeks   Status New               Plan - 05/28/16 1121    Clinical Impression Statement Pt reports R posterior hip pain with L sidelying R hip abduction. Modified to isometric holds and pain resolves. He continues to demonstrate mild lack of R knee extension during gait and with squats. Pt instructed in gentle R hip IR and ER stretches in supine and istting. Encouraged pt to continue HEP and follow-up as scheduled.    Rehab Potential Good   Clinical Impairments Affecting Rehab Potential Excellent progress with home health therapy thus far, age.    PT Frequency 2x / week   PT Duration 4 weeks   PT Treatment/Interventions Therapeutic exercise;Therapeutic activities;Manual techniques;Gait training;Stair training   PT Next Visit Plan Quad, hip abduction strengthening. Ankle DF ROM.    PT Home Exercise Plan Squats, isometric hip abductions and single leg squats, lateral band walks. Assess if supine piriformis stretch helped R buttock pain.-- piriformis stretch.    Consulted and Agree with Plan of Care Patient      Patient will benefit from skilled therapeutic intervention in order to improve the following deficits and impairments:  Abnormal gait, Difficulty walking, Pain  Visit Diagnosis: Difficulty in walking, not elsewhere classified  Pain in right knee     Problem List Patient Active Problem List   Diagnosis Date Noted  . Arthritis of shoulder region, degenerative 09/10/2015  . Clinical  depression 07/22/2015  . Acid reflux 07/22/2015  . IBS (irritable bowel syndrome) 07/22/2015  .  AK (actinic keratosis) 07/22/2015  . TIA (transient ischemic attack) 07/22/2015  . OSA (obstructive sleep apnea) 07/22/2015  . Idiopathic trigeminal neuralgia 07/22/2015  . CAD in native artery 07/22/2015  . GERD (gastroesophageal reflux disease) 07/22/2015  . Allergic rhinitis 07/22/2015  . Arteriosclerosis of coronary artery 10/16/2014  . Essential (primary) hypertension 08/21/2014  . Combined fat and carbohydrate induced hyperlipemia 08/21/2014  . Heart attack ([place]) 08/13/2014  . Benign prostatic hypertrophy without urinary obstruction 01/16/2014  . Renal colic 0000000   Phillips Grout PT, DPT   Huprich,Jason 05/28/2016, 12:39 PM  [facility] [address], [place], [address] Phone: [phone]   Fax:  [phone]  Name: Hussain Natale MRN: [id number] Date of Birth: 1945-10-22

## 2016-06-01 DIAGNOSIS — E782 Mixed hyperlipidemia: Secondary | ICD-10-CM | POA: Diagnosis not present

## 2016-06-01 DIAGNOSIS — D2261 Melanocytic nevi of right upper limb, including shoulder: Secondary | ICD-10-CM | POA: Diagnosis not present

## 2016-06-01 DIAGNOSIS — D2272 Melanocytic nevi of left lower limb, including hip: Secondary | ICD-10-CM | POA: Diagnosis not present

## 2016-06-01 DIAGNOSIS — I10 Essential (primary) hypertension: Secondary | ICD-10-CM | POA: Diagnosis not present

## 2016-06-01 DIAGNOSIS — D485 Neoplasm of uncertain behavior of skin: Secondary | ICD-10-CM | POA: Diagnosis not present

## 2016-06-01 DIAGNOSIS — L814 Other melanin hyperpigmentation: Secondary | ICD-10-CM | POA: Diagnosis not present

## 2016-06-01 DIAGNOSIS — L570 Actinic keratosis: Secondary | ICD-10-CM | POA: Diagnosis not present

## 2016-06-01 DIAGNOSIS — X32XXXA Exposure to sunlight, initial encounter: Secondary | ICD-10-CM | POA: Diagnosis not present

## 2016-06-01 DIAGNOSIS — D225 Melanocytic nevi of trunk: Secondary | ICD-10-CM | POA: Diagnosis not present

## 2016-06-01 DIAGNOSIS — Z85820 Personal history of malignant melanoma of skin: Secondary | ICD-10-CM | POA: Diagnosis not present

## 2016-06-01 DIAGNOSIS — I2510 Atherosclerotic heart disease of native coronary artery without angina pectoris: Secondary | ICD-10-CM | POA: Diagnosis not present

## 2016-06-03 ENCOUNTER — Ambulatory Visit: Payer: PPO | Admitting: Physical Therapy

## 2016-06-03 DIAGNOSIS — R262 Difficulty in walking, not elsewhere classified: Secondary | ICD-10-CM

## 2016-06-03 DIAGNOSIS — M25561 Pain in right knee: Secondary | ICD-10-CM

## 2016-06-09 NOTE — Patient Instructions (Signed)
Patient able to ascend/descend steps with and without use of hands with increase in symptoms. Patient observed ambulating, knee lands in extension in heel strike and appears to be in extension in mid-stance.   Squats to parallel depth with minimal valgus noted, anterior tibial displacement   Sit to stands are not challenging, attempted single leg sit to stands with use of UEs, more challenging 3 sets of 5-8 repetitions -- no increase in pain reported   Single leg stance on foam pad with ball toss to trampoline x 5 bouts of 3-5 repetitions bilaterally, provided as part of HEP as he showed deficits relative to anticipated pre-surgical baseline.

## 2016-06-09 NOTE — Therapy (Signed)
[place] [facility] [address] [place], [address] Phone: [phone]   Fax:  [phone]  Physical Therapy Treatment  Patient Details  Name: Timothy Singleton MRN: [id number] Date of Birth: January 27, 1946 No Data Recorded  Encounter Date: 06/03/2016      PT End of Session - 06/09/16 1051    Visit Number 12   Number of Visits 15   Date for PT Re-Evaluation 06/22/16   PT Start Time [id number]   PT Stop Time 1800   PT Time Calculation (min) 45 min   Activity Tolerance Patient tolerated treatment well   Behavior During Therapy [facility] for tasks assessed/performed      Past Medical History:  Diagnosis Date  . Arthritis   . Cancer ([place])    melanoma / knee  . Depression   . GERD (gastroesophageal reflux disease)   . Hyperlipemia   . Hypertension   . Past heart attack   . Sleep apnea     Past Surgical History:  Procedure Laterality Date  . BUNIONECTOMY    . CORONARY STENT PLACEMENT    . EXTRACORPOREAL SHOCK WAVE LITHOTRIPSY    . HERNIA REPAIR     inguinal-right  . KNEE SURGERY Right   . TONSILLECTOMY    . TOTAL SHOULDER REPLACEMENT    . UPPER GI ENDOSCOPY  10/18/01   hiatus hernia  . VASECTOMY    . WRIST SURGERY      There were no vitals filed for this visit.      Subjective Assessment - 06/09/16 1055    Subjective Patient reports he tried working out fairly hard on Saturday and felt increased pain on Sunday. He reports he has had decreased pain in his R hip since then as he has done less since then. He was discharged from his MD during their last visit. He reports his biggest issue currently is hip pain while he is sleeping.    Patient is accompained by: Family member   Pertinent History Patient had R TKR on 03/23/2016. Prior to that he had a melanoma excised on R quad ~ 5 weeks prior to TKR.    Limitations Walking;House hold activities   Patient Stated Goals To return to his usual hobbies like golf.    Currently in Pain? Yes   Pain Score --  Mild pain today   Pain Location Hip   Pain Orientation Right;Posterior   Pain Descriptors / Indicators Burning   Pain Type Chronic pain;Surgical pain   Pain Onset More than a month ago   Pain Frequency Intermittent   Aggravating Factors  Sitting for extended periods       Patient able to ascend/descend steps with and without use of hands with increase in symptoms. Patient observed ambulating, knee lands in extension in heel strike and appears to be in extension in mid-stance.   Squats to parallel depth with minimal valgus noted, anterior tibial displacement   Sit to stands are not challenging, attempted single leg sit to stands with use of UEs, more challenging 3 sets of 5-8 repetitions -- no increase in pain reported   Single leg stance on foam pad with ball toss to trampoline x 5 bouts of 3-5 repetitions bilaterally, provided as part of HEP as he showed deficits relative to anticipated pre-surgical baseline.                            PT Education -  06/11/16 1051    Education provided Yes   Education Details Provided d/c instructions -- call when done with vacations for screen.    Person(s) Educated Patient   Methods Explanation;Demonstration;Handout   Comprehension Returned demonstration;Verbalized understanding             PT Long Term Goals - 05/18/16 1122      PT LONG TERM GOAL #1   Title Patient will report LEFS score of greater than 40/80 to demonstrate improved tolerance for ADLs.    Baseline 29/80 at baseline, 52/80 on 7/31   Time 6   Period Weeks   Status Achieved     PT LONG TERM GOAL #2   Title Patient will demonstrate 5/5 MMT on R hip abductor test to demonstrate improved LE strength for ADL completion.    Baseline 4/5 on June 26.    Time 6   Period Weeks   Status Achieved     PT LONG TERM GOAL #3   Title Patient will perform parallel squat with no HHA to demonstrate adequate LE strength  for ADLs.    Baseline Mild pain in butt at bottom.    Time 6   Period Weeks   Status Achieved     PT LONG TERM GOAL #4   Title Patient will report worst pain on VAS of less than 3/10 to demonstrate improved tolerance for ADLs. on 7/31 - worst is a temporary 3/10 shooting pain.    Baseline 8/10   Time 6   Period Weeks   Status Achieved     PT LONG TERM GOAL #5   Title Patient will report LEFS score greater than 61/80 to demonstrate improved tolerance for ADLs.    Baseline 52/80 on 7/31   Time 4   Period Weeks   Status New               Plan - 06/11/16 1052    Clinical Impression Statement Patient reports he was discharged by MD prior to this session. He reports his R hip while still not 100% is improving. Reports he has not had knee pain and is able to complete all ADLs and recreational activities without increase in symptoms. He is able to progress to higher level balance and strengthening activities in this session without increase in symptoms. He appears appropriate for discharge as he is going on vacation for a few weeks, will screen after for any residual deficits.    Rehab Potential Good   Clinical Impairments Affecting Rehab Potential Excellent progress with home health therapy thus far, age.    PT Frequency 2x / week   PT Duration 4 weeks   PT Treatment/Interventions Therapeutic exercise;Therapeutic activities;Manual techniques;Gait training;Stair training   PT Next Visit Plan Quad, hip abduction strengthening. Ankle DF ROM.    PT Home Exercise Plan Squats, isometric hip abductions and single leg squats, lateral band walks. Assess if supine piriformis stretch helped R buttock pain.-- piriformis stretch.    Consulted and Agree with Plan of Care Patient      Patient will benefit from skilled therapeutic intervention in order to improve the following deficits and impairments:  Abnormal gait, Difficulty walking, Pain  Visit Diagnosis: Difficulty in walking, not  elsewhere classified  Pain in right knee       G-Codes - 2016/06/11 1052    Functional Assessment Tool Used Patient report    Functional Limitation Mobility: Walking and moving around   Mobility: Walking and Moving Around Current Status [id number]) At  least 1 percent but less than 20 percent impaired, limited or restricted   Mobility: Walking and Moving Around Goal Status [phone]) At least 1 percent but less than 20 percent impaired, limited or restricted   Mobility: Walking and Moving Around Discharge Status [phone]) At least 1 percent but less than 20 percent impaired, limited or restricted      Problem List Patient Active Problem List   Diagnosis Date Noted  . Arthritis of shoulder region, degenerative 09/10/2015  . Clinical depression 07/22/2015  . Acid reflux 07/22/2015  . IBS (irritable bowel syndrome) 07/22/2015  . AK (actinic keratosis) 07/22/2015  . TIA (transient ischemic attack) 07/22/2015  . OSA (obstructive sleep apnea) 07/22/2015  . Idiopathic trigeminal neuralgia 07/22/2015  . CAD in native artery 07/22/2015  . GERD (gastroesophageal reflux disease) 07/22/2015  . Allergic rhinitis 07/22/2015  . Arteriosclerosis of coronary artery 10/16/2014  . Essential (primary) hypertension 08/21/2014  . Combined fat and carbohydrate induced hyperlipemia 08/21/2014  . Heart attack ([place]) 08/13/2014  . Benign prostatic hypertrophy without urinary obstruction 01/16/2014  . Renal colic 0000000   Kerman Passey, PT, DPT    06/09/2016, 10:57 AM  [facility] [address], [place], [address] Phone: [phone]   Fax:  [phone]  Name: Timothy Singleton MRN: [id number] Date of Birth: 07-31-1946

## 2016-06-11 ENCOUNTER — Encounter: Payer: Self-pay | Admitting: Physical Therapy

## 2016-06-17 ENCOUNTER — Encounter: Payer: Self-pay | Admitting: Physical Therapy

## 2016-06-25 ENCOUNTER — Ambulatory Visit (INDEPENDENT_AMBULATORY_CARE_PROVIDER_SITE_OTHER): Payer: PPO | Admitting: Family Medicine

## 2016-06-25 VITALS — BP 142/60 | HR 60 | Temp 97.7°F | Resp 14 | Wt 171.0 lb

## 2016-06-25 DIAGNOSIS — M1990 Unspecified osteoarthritis, unspecified site: Secondary | ICD-10-CM

## 2016-06-25 NOTE — Progress Notes (Signed)
Timothy Singleton  MRN: [id number] DOB: [birth year]  Subjective:  HPI  Patient states he had right knee replacement in June 2017 and was released by orthopedic about 3 weeks ago. He has been given Meloxicam that he takes 2 times daily. He has noticed swelling in his right ankle and calf since then, swelling gets worse as the day goes on and gets better over night. Some warmth to the area of swelling present at times. No fever. Feels tight. No chest pain or tightness, no shortness of breath. Patient Active Problem List   Diagnosis Date Noted  . Arthritis of shoulder region, degenerative 09/10/2015  . Clinical depression 07/22/2015  . Acid reflux 07/22/2015  . IBS (irritable bowel syndrome) 07/22/2015  . AK (actinic keratosis) 07/22/2015  . TIA (transient ischemic attack) 07/22/2015  . OSA (obstructive sleep apnea) 07/22/2015  . Idiopathic trigeminal neuralgia 07/22/2015  . CAD in native artery 07/22/2015  . GERD (gastroesophageal reflux disease) 07/22/2015  . Allergic rhinitis 07/22/2015  . Arteriosclerosis of coronary artery 10/16/2014  . Essential (primary) hypertension 08/21/2014  . Combined fat and carbohydrate induced hyperlipemia 08/21/2014  . Heart attack ([place]) 08/13/2014  . Benign prostatic hypertrophy without urinary obstruction 01/16/2014  . Renal colic 0000000    Past Medical History:  Diagnosis Date  . Arthritis   . Cancer ([place])    melanoma / knee  . Depression   . GERD (gastroesophageal reflux disease)   . Hyperlipemia   . Hypertension   . Past heart attack   . Sleep apnea     Social History   Social History  . Marital status: Married    Spouse name: N/A  . Number of children: N/A  . Years of education: N/A   Occupational History  . Not on file.   Social History Main Topics  . Smoking status: Former Research scientist (life sciences)  . Smokeless tobacco: Not on file     Comment: quit 40 years ago   . Alcohol use 0.0 oz/week     Comment: 2-4 times per month  . Drug  use: Unknown  . Sexual activity: Not on file   Other Topics Concern  . Not on file   Social History Narrative  . No narrative on file    Outpatient Encounter Prescriptions as of 06/25/2016  Medication Sig Note  . aspirin 81 MG tablet Take 81 mg by mouth daily.   Marland Kitchen atorvastatin (LIPITOR) 80 MG tablet Take 1 tablet (80 mg total) by mouth daily.   . carbamazepine (CARBATROL) 200 MG 12 hr capsule Take 1 capsule (200 mg total) by mouth 2 (two) times daily.   Marland Kitchen lisinopril (PRINIVIL,ZESTRIL) 10 MG tablet  10/29/2014: Received from: External Pharmacy  . meloxicam (MOBIC) 15 MG tablet Take 1 tablet by mouth 2 (two) times daily. 06/25/2016: Received from: [facility]  . sertraline (ZOLOFT) 100 MG tablet Take 1 tablet (100 mg total) by mouth daily.   . tamsulosin (FLOMAX) 0.4 MG CAPS capsule Take 1 capsule (0.4 mg total) by mouth daily.    No facility-administered encounter medications on file as of 06/25/2016.     No Known Allergies  Review of Systems  Constitutional: Negative.   Respiratory: Negative.   Cardiovascular: Positive for leg swelling. Negative for chest pain and palpitations.  Musculoskeletal: Negative.   Neurological: Negative.    Objective:  BP (!) 142/60   Pulse 60   Temp 97.7 F (36.5 C)   Resp 14   Wt 171 lb (77.6 kg)  BMI 23.85 kg/m   Physical Exam  Constitutional: He is oriented to person, place, and time and well-developed, well-nourished, and in no distress.  HENT:  Head: Normocephalic and atraumatic.  Eyes: Conjunctivae are normal.  Neck: Neck supple.  Cardiovascular: Normal rate and regular rhythm.   Pulmonary/Chest: Effort normal and breath sounds normal.  Musculoskeletal:  Mild swelling of the right foot and ankle. Minimal edema above the ankle. Calf not swollen. Negative Homans negative cords  Neurological: He is alert and oriented to person, place, and time.  Skin: Skin is warm and dry.  Psychiatric: Mood, memory, affect and judgment normal.     Assessment and Plan :  Osteoarthritis//status post right total knee replacement Venous insufficiency I think the swelling today is mainly from residual swelling from TKR. Advised patient to wear support hose. I think this will slowly resolve over the next 6-12 months. No evidence of DVT. BPH I have done the exam and reviewed the above chart and it is accurate to the best of my knowledge.

## 2016-06-26 ENCOUNTER — Ambulatory Visit (INDEPENDENT_AMBULATORY_CARE_PROVIDER_SITE_OTHER): Payer: PPO | Admitting: Urology

## 2016-06-26 ENCOUNTER — Encounter: Payer: Self-pay | Admitting: Urology

## 2016-06-26 VITALS — BP 121/63 | HR 66 | Ht 71.0 in | Wt 172.0 lb

## 2016-06-26 DIAGNOSIS — N200 Calculus of kidney: Secondary | ICD-10-CM

## 2016-06-26 DIAGNOSIS — N179 Acute kidney failure, unspecified: Secondary | ICD-10-CM | POA: Diagnosis not present

## 2016-06-26 NOTE — Progress Notes (Signed)
06/26/2016 11:34 AM   Timothy Singleton 05/24/1946 [id number]  Referring provider: Jerrol Banana., MD [address] [place], [place] [address]  Chief Complaint  Patient presents with  . Nephrolithiasis    66month    HPI: 70 year old male with a history of nephrolithiasis.  Returns today for 3 month follow up.  CT scan did also show several nonobstructing stones, right greater than left measuring up to 11 mm on the right. This appears to be stable in size since at least 2015 (KUB).   Recently passed 2 left ureteral stones in 02/2016.  He does have a personal history of kidney stones.  He is s/p ESWL in ~2012 by Dr. Bernardo Heater.   He does admit that he does not drink enough water but is tring to drink more since last visit.  He's never had a 123456 urine metabolic workup.  Stone Analysis 95% calcium oxalate monohydrate, 2% calcium oxalate dihydrate, 3% calcium carbonate.  He returns today to discuss whether or not he like to pursue definitive management for his nonobstructing stones. He was seen 3 months ago at which time he was going to be undergoing a knee surgery and therefore wanted to defer any further treatment.   He has not had any issues with flank pain or gross hematuria since his last visit.     PMH: Past Medical History:  Diagnosis Date  . Arthritis   . Cancer ([place])    melanoma / knee  . Depression   . GERD (gastroesophageal reflux disease)   . Hyperlipemia   . Hypertension   . Past heart attack   . Sleep apnea     Surgical History: Past Surgical History:  Procedure Laterality Date  . BUNIONECTOMY    . CORONARY STENT PLACEMENT    . EXTRACORPOREAL SHOCK WAVE LITHOTRIPSY    . HERNIA REPAIR     inguinal-right  . KNEE SURGERY Right   . TONSILLECTOMY    . TOTAL SHOULDER REPLACEMENT    . UPPER GI ENDOSCOPY  10/18/01   hiatus hernia  . VASECTOMY    . WRIST SURGERY      Home Medications:    Medication List       Accurate as of 06/26/16  11:34 AM. Always use your most recent med list.          aspirin 81 MG tablet Take 81 mg by mouth daily.   atorvastatin 80 MG tablet Commonly known as:  LIPITOR Take 1 tablet (80 mg total) by mouth daily.   carbamazepine 200 MG 12 hr capsule Commonly known as:  CARBATROL Take 1 capsule (200 mg total) by mouth 2 (two) times daily.   lisinopril 10 MG tablet Commonly known as:  PRINIVIL,ZESTRIL   meloxicam 15 MG tablet Commonly known as:  MOBIC Take 1 tablet by mouth 2 (two) times daily.   sertraline 100 MG tablet Commonly known as:  ZOLOFT Take 1 tablet (100 mg total) by mouth daily.   tamsulosin 0.4 MG Caps capsule Commonly known as:  FLOMAX Take 1 capsule (0.4 mg total) by mouth daily.       Allergies: No Known Allergies  Family History: Family History  Problem Relation Age of Onset  . Cancer Mother   . Dementia Mother   . Stroke Father   . Heart disease Father   . Hypertension Father   . Breast cancer Sister   . Parkinson's disease Brother   . Hypertension Brother   . Melanoma Maternal  Grandmother   . Bladder Cancer Neg Hx   . Prostate cancer Neg Hx   . Kidney cancer Neg Hx     Social History:  reports that he has quit smoking. He has never used smokeless tobacco. He reports that he drinks alcohol. His drug history is not on file.  ROS: UROLOGY Frequent Urination?: No Hard to postpone urination?: No Burning/pain with urination?: No Get up at night to urinate?: Yes Leakage of urine?: No Urine stream starts and stops?: No Trouble starting stream?: No Do you have to strain to urinate?: No Blood in urine?: No Urinary tract infection?: No Sexually transmitted disease?: No Injury to kidneys or bladder?: No Painful intercourse?: No Weak stream?: No Erection problems?: No Penile pain?: No  Gastrointestinal Nausea?: No Vomiting?: No Indigestion/heartburn?: No Diarrhea?: No Constipation?: No  Constitutional Fever: No Night sweats?: No Weight  loss?: No Fatigue?: No  Skin Skin rash/lesions?: No Itching?: No  Eyes Blurred vision?: No Double vision?: No  Ears/Nose/Throat Sore throat?: No Sinus problems?: No  Hematologic/Lymphatic Swollen glands?: No Easy bruising?: No  Cardiovascular Leg swelling?: No Chest pain?: No  Respiratory Cough?: No Shortness of breath?: No  Endocrine Excessive thirst?: No  Musculoskeletal Back pain?: No Joint pain?: No  Neurological Headaches?: No Dizziness?: No  Psychologic Depression?: No Anxiety?: No  Physical Exam: BP 121/63   Pulse 66   Ht 5\' 11"  (1.803 m)   Wt 172 lb (78 kg)   BMI 23.99 kg/m   Constitutional:  Alert and oriented, No acute distress. HEENT: [place] AT, moist mucus membranes.  Trachea midline, no masses. Cardiovascular: No clubbing, cyanosis, or edema. Respiratory: Normal respiratory effort, no increased work of breathing. GI: Abdomen is soft, nontender, nondistended, no abdominal masses GU: No CVA tenderness.  Skin: No rashes, bruises or suspicious lesions. Neurologic: Grossly intact, no focal deficits, moving all 4 extremities. Psychiatric: Normal mood and affect.  Laboratory Data: Lab Results  Component Value Date   WBC 8.1 03/11/2016   HGB 13.8 03/11/2016   HCT 40.4 03/11/2016   MCV 87.1 03/11/2016   PLT 227 03/11/2016    Lab Results  Component Value Date   CREATININE 1.34 (H) 03/11/2016   KUB: CLINICAL DATA:  Bilateral nephrolithiasis and distal left ureteral calculi.  EXAM: ABDOMEN - 1 VIEW  COMPARISON:  CT on 03/11/2016 and KUB on 01/16/2014  FINDINGS: 12 mm calculus again seen in lower pole of right kidney and tiny less than 5 mm calculi again noted in lower pole of left kidney.  The area of the urinary bladder is obscured by overlying stool within the rectum. Left pelvic phlebolith again noted. Previously demonstrated small calculi at the left ureterovesical junction on CT are not well visualized.  The bowel gas  pattern is normal. Surgical clips seen in the right pelvis.  IMPRESSION: Bilateral intrarenal calculi, largest measuring 12 mm in lower pole of right kidney.  Area of urinary bladder is obscured by stool within the rectum, and previously demonstrated small calculi at the left ureterovesical and junction on CT are not well visualized.   Electronically Signed   By: Earle Gell M.D.   On: 03/19/2016 08:57  Reviewed today personally  Assessment & Plan:   1. Kidney stones Multiple nonobstructing kidney stones, largest measuring 11 mm right, essentially stable sine 2015.  Asymptomatic.  Given the size of the right-sided stone, I Discussed alternatives today including ESWL versus ureteroscopy vs. Ongoing observation.  He understands that if this stone should pass, he would need  surgical intervention. Risk/ benefits of each were discussed.  He would like to continue to follow the stone. He'll return sooner as needed should he develop hematuria or flank pain.  Stone analysis review today. Stone diary was reviewed and hydration was encouraged.  Offered 123456 urine metabolic workup, declined.  2. AKI Likely secondary to prerenal causes versus obstruction which has since resolved.  Return in about 6 months (around 12/24/2016) for f/u KUB.  Hollice Espy, MD  [facility] [address], [place], [place] [address] [phone]

## 2016-06-26 NOTE — Patient Instructions (Signed)
Dietary Guidelines to Help Prevent Kidney Stones Your risk of kidney stones can be decreased by adjusting the foods you eat. The most important thing you can do is drink enough fluid. You should drink enough fluid to keep your urine clear or pale yellow. The following guidelines provide specific information for the type of kidney stone you have had. GUIDELINES ACCORDING TO TYPE OF KIDNEY STONE Calcium Oxalate Kidney Stones  Reduce the amount of salt you eat. Foods that have a lot of salt cause your body to release excess calcium into your urine. The excess calcium can combine with a substance called oxalate to form kidney stones.  Reduce the amount of animal protein you eat if the amount you eat is excessive. Animal protein causes your body to release excess calcium into your urine. Ask your dietitian how much protein from animal sources you should be eating.  Avoid foods that are high in oxalates. If you take vitamins, they should have less than 500 mg of vitamin C. Your body turns vitamin C into oxalates. You do not need to avoid fruits and vegetables high in vitamin C. Calcium Phosphate Kidney Stones  Reduce the amount of salt you eat to help prevent the release of excess calcium into your urine.  Reduce the amount of animal protein you eat if the amount you eat is excessive. Animal protein causes your body to release excess calcium into your urine. Ask your dietitian how much protein from animal sources you should be eating.  Get enough calcium from food or take a calcium supplement (ask your dietitian for recommendations). Food sources of calcium that do not increase your risk of kidney stones include:  Broccoli.  Dairy products, such as cheese and yogurt.  Pudding. Uric Acid Kidney Stones  Do not have more than 6 oz of animal protein per day. FOOD SOURCES Animal Protein Sources  Meat (all types).  Poultry.  Eggs.  Fish, seafood. Foods High in Salt  Salt seasonings.  Soy  sauce.  Teriyaki sauce.  Cured and processed meats.  Salted crackers and snack foods.  Fast food.  Canned soups and most canned foods. Foods High in Oxalates  Grains:  Amaranth.  Barley.  Grits.  Wheat germ.  Bran.  Buckwheat flour.  All bran cereals.  Pretzels.  Whole wheat bread.  Vegetables:  Beans (wax).  Beets and beet greens.  Collard greens.  Eggplant.  Escarole.  Leeks.  Okra.  Parsley.  Rutabagas.  Spinach.  Swiss chard.  Tomato paste.  Fried potatoes.  Sweet potatoes.  Fruits:  Red currants.  Figs.  Kiwi.  Rhubarb.  Meat and Other Protein Sources:  Beans (dried).  Soy burgers and other soybean products.  Miso.  Nuts (peanuts, almonds, pecans, cashews, hazelnuts).  Nut butters.  Sesame seeds and tahini (paste made of sesame seeds).  Poppy seeds.  Beverages:  Chocolate drink mixes.  Soy milk.  Instant iced tea.  Juices made from high-oxalate fruits or vegetables.  Other:  Carob.  Chocolate.  Fruitcake.  Marmalades.   This information is not intended to replace advice given to you by your health care provider. Make sure you discuss any questions you have with your health care provider.   Document Released: 01/30/2011 Document Revised: 10/10/2013 Document Reviewed: 09/01/2013 Elsevier Interactive Patient Education 2016 Elsevier Inc.  

## 2016-07-02 ENCOUNTER — Other Ambulatory Visit (HOSPITAL_BASED_OUTPATIENT_CLINIC_OR_DEPARTMENT_OTHER): Payer: Self-pay | Admitting: Emergency Medicine

## 2016-07-02 DIAGNOSIS — I82402 Acute embolism and thrombosis of unspecified deep veins of left lower extremity: Secondary | ICD-10-CM

## 2016-07-02 DIAGNOSIS — M7989 Other specified soft tissue disorders: Secondary | ICD-10-CM

## 2016-07-02 DIAGNOSIS — R52 Pain, unspecified: Secondary | ICD-10-CM

## 2016-07-03 ENCOUNTER — Ambulatory Visit (HOSPITAL_BASED_OUTPATIENT_CLINIC_OR_DEPARTMENT_OTHER): Admission: RE | Admit: 2016-07-03 | Payer: PPO | Source: Ambulatory Visit

## 2016-07-25 ENCOUNTER — Other Ambulatory Visit: Payer: Self-pay | Admitting: Family Medicine

## 2016-09-02 DIAGNOSIS — X32XXXA Exposure to sunlight, initial encounter: Secondary | ICD-10-CM | POA: Diagnosis not present

## 2016-09-02 DIAGNOSIS — L570 Actinic keratosis: Secondary | ICD-10-CM | POA: Diagnosis not present

## 2016-09-02 DIAGNOSIS — Z08 Encounter for follow-up examination after completed treatment for malignant neoplasm: Secondary | ICD-10-CM | POA: Diagnosis not present

## 2016-09-02 DIAGNOSIS — Z85820 Personal history of malignant melanoma of skin: Secondary | ICD-10-CM | POA: Diagnosis not present

## 2016-09-02 DIAGNOSIS — C44729 Squamous cell carcinoma of skin of left lower limb, including hip: Secondary | ICD-10-CM | POA: Diagnosis not present

## 2016-09-02 DIAGNOSIS — D485 Neoplasm of uncertain behavior of skin: Secondary | ICD-10-CM | POA: Diagnosis not present

## 2016-09-21 ENCOUNTER — Ambulatory Visit: Payer: Self-pay | Admitting: Family Medicine

## 2016-09-28 ENCOUNTER — Ambulatory Visit: Payer: Self-pay | Admitting: Family Medicine

## 2016-09-30 ENCOUNTER — Ambulatory Visit (INDEPENDENT_AMBULATORY_CARE_PROVIDER_SITE_OTHER): Payer: PPO | Admitting: Family Medicine

## 2016-09-30 ENCOUNTER — Encounter: Payer: Self-pay | Admitting: Family Medicine

## 2016-09-30 VITALS — BP 110/58 | HR 72 | Temp 98.1°F | Resp 14 | Wt 163.0 lb

## 2016-09-30 DIAGNOSIS — F329 Major depressive disorder, single episode, unspecified: Secondary | ICD-10-CM

## 2016-09-30 DIAGNOSIS — Z23 Encounter for immunization: Secondary | ICD-10-CM

## 2016-09-30 DIAGNOSIS — Z1211 Encounter for screening for malignant neoplasm of colon: Secondary | ICD-10-CM

## 2016-09-30 DIAGNOSIS — R1084 Generalized abdominal pain: Secondary | ICD-10-CM

## 2016-09-30 DIAGNOSIS — F32A Depression, unspecified: Secondary | ICD-10-CM

## 2016-09-30 MED ORDER — RANITIDINE HCL 150 MG PO CAPS: 150.0000 mg | ORAL_CAPSULE | Freq: Two times a day (BID) | ORAL | 11 refills | Status: DC

## 2016-09-30 MED ORDER — SERTRALINE HCL 100 MG PO TABS: 150.0000 mg | ORAL_TABLET | Freq: Every day | ORAL | 12 refills | Status: DC

## 2016-09-30 NOTE — Progress Notes (Signed)
Subjective:  HPI Pt is here for a 2 month follow on his leg swelling/venous insuffiencey. He reports that the swelling in his right leg has resolved and he believes it was from the TKR.   Pt now experiencing abdominal pain. He reports that it is not in specific area and is intermittent. Usually after he eats but has happened when he had not eaten. Denies any other GI symptoms. Describes the pain as a cramping pain.His bowel movements are normal.  He is also having some fatigue. Unsure if it is from getting up at night to urinate or something else.  He does c/o nocturia times 2-4 per night.  Prior to Admission medications   Medication Sig Start Date End Date Taking? Authorizing Provider  aspirin 81 MG tablet Take 81 mg by mouth daily.    Historical Provider, MD  Aspirin-Calcium Carbonate 81-777 MG TABS Take by mouth.    Historical Provider, MD  atorvastatin (LIPITOR) 80 MG tablet Take 1 tablet (80 mg total) by mouth daily. 01/21/16   Reyes Fifield Maceo Pro., MD  carbamazepine (CARBATROL) 200 MG 12 hr capsule Take 1 capsule (200 mg total) by mouth 2 (two) times daily. Patient not taking: Reported on 06/26/2016 01/08/16   Jerrol Banana., MD  chlorhexidine (PERIDEX) 0.12 % solution  08/10/16   Historical Provider, MD  diflunisal (DOLOBID) 500 MG TABS tablet  08/10/16   Historical Provider, MD  lisinopril (PRINIVIL,ZESTRIL) 10 MG tablet  09/11/14   Historical Provider, MD  lisinopril (PRINIVIL,ZESTRIL) 20 MG tablet  09/28/16   Historical Provider, MD  meloxicam (MOBIC) 15 MG tablet Take 1 tablet by mouth 2 (two) times daily. 01/11/14   Historical Provider, MD  sertraline (ZOLOFT) 100 MG tablet Take 1 tablet (100 mg total) by mouth daily. 01/21/16 05/17/17  Jerrol Banana., MD  tamsulosin (FLOMAX) 0.4 MG CAPS capsule TAKE 1 CAPSULE (0.4 MG TOTAL) BY MOUTH DAILY. 07/27/16   Garrison Michie Maceo Pro., MD    Patient Active Problem List   Diagnosis Date Noted  . Arthritis of shoulder region,  degenerative 09/10/2015  . Clinical depression 07/22/2015  . Acid reflux 07/22/2015  . IBS (irritable bowel syndrome) 07/22/2015  . AK (actinic keratosis) 07/22/2015  . TIA (transient ischemic attack) 07/22/2015  . OSA (obstructive sleep apnea) 07/22/2015  . Idiopathic trigeminal neuralgia 07/22/2015  . CAD in native artery 07/22/2015  . GERD (gastroesophageal reflux disease) 07/22/2015  . Allergic rhinitis 07/22/2015  . Arteriosclerosis of coronary artery 10/16/2014  . Essential (primary) hypertension 08/21/2014  . Combined fat and carbohydrate induced hyperlipemia 08/21/2014  . Heart attack 08/13/2014  . Benign prostatic hypertrophy without urinary obstruction 01/16/2014  . Renal colic 0000000    Past Medical History:  Diagnosis Date  . Arthritis   . Cancer ([place])    melanoma / knee  . Depression   . GERD (gastroesophageal reflux disease)   . Hyperlipemia   . Hypertension   . Past heart attack   . Sleep apnea     Social History   Social History  . Marital status: Married    Spouse name: N/A  . Number of children: N/A  . Years of education: N/A   Occupational History  . Not on file.   Social History Main Topics  . Smoking status: Former Research scientist (life sciences)  . Smokeless tobacco: Never Used     Comment: quit 40 years ago   . Alcohol use 0.0 oz/week     Comment: 2-4 times per  month  . Drug use: Unknown  . Sexual activity: Not on file   Other Topics Concern  . Not on file   Social History Narrative  . No narrative on file    No Known Allergies  Review of Systems  Constitutional: Positive for malaise/fatigue.  HENT: Negative.   Eyes: Negative.   Respiratory: Negative.   Cardiovascular: Negative.   Gastrointestinal: Positive for abdominal pain.  Genitourinary: Negative.   Musculoskeletal: Negative.   Skin: Negative.   Neurological: Positive for headaches.  Endo/Heme/Allergies: Negative.   Psychiatric/Behavioral: Negative.     Immunization History    Administered Date(s) Administered  . Influenza, High Dose Seasonal PF 07/23/2015  . Pneumococcal Conjugate-13 07/11/2014  . Td 09/20/2003  . Zoster 01/28/2010    Objective:  BP (!) 110/58 (BP Location: Left Arm, Patient Position: Sitting, Cuff Size: Normal)   Pulse 72   Temp 98.1 F (36.7 C) (Oral)   Resp 14   Wt 163 lb (73.9 kg)   BMI 22.73 kg/m    Depression screen [facility] 2/9 09/30/2016 07/23/2015  Decreased Interest 0 0  Down, Depressed, Hopeless 0 0  PHQ - 2 Score 0 0  Altered sleeping 2 -  Tired, decreased energy 2 -  Change in appetite 0 -  Feeling bad or failure about yourself  0 -  Trouble concentrating 1 -  Moving slowly or fidgety/restless 0 -  Suicidal thoughts 0 -  PHQ-9 Score 5 -  Difficult doing work/chores Not difficult at all -    Physical Exam  Constitutional: He is oriented to person, place, and time and well-developed, well-nourished, and in no distress.  Eyes: Conjunctivae and EOM are normal. Pupils are equal, round, and reactive to light.  Neck: Normal range of motion. Neck supple.  Cardiovascular: Normal rate, regular rhythm, normal heart sounds and intact distal pulses.   Pulmonary/Chest: Effort normal and breath sounds normal.  Abdominal: Soft. Bowel sounds are normal.  Musculoskeletal: Normal range of motion.  Neurological: He is alert and oriented to person, place, and time. He has normal reflexes. Gait normal. GCS score is 15.  Skin: Skin is warm and dry.  Psychiatric: Mood, memory, affect and judgment normal.    Lab Results  Component Value Date   WBC 8.1 03/11/2016   HGB 13.8 03/11/2016   HCT 40.4 03/11/2016   PLT 227 03/11/2016   GLUCOSE 104 (H) 03/11/2016   CHOL 98 09/28/2014   TRIG 87 09/28/2014   HDL 36 09/28/2014   LDLCALC 45 09/28/2014   TSH 2.03 09/28/2014    CMP     Component Value Date/Time   NA 137 03/11/2016 0929   NA 143 09/10/2015   K 4.8 03/11/2016 0929   CL 103 03/11/2016 0929   CO2 28 03/11/2016 0929    GLUCOSE 104 (H) 03/11/2016 0929   BUN 18 03/11/2016 0929   BUN 14 09/10/2015   CREATININE 1.34 (H) 03/11/2016 0929   CREATININE 1.18 12/12/2012 0913   CALCIUM 9.7 03/11/2016 0929   PROT 6.7 03/11/2016 0929   ALBUMIN 4.1 03/11/2016 0929   AST 21 03/11/2016 0929   ALT 18 03/11/2016 0929   ALKPHOS 86 03/11/2016 0929   BILITOT 1.0 03/11/2016 0929   GFRNONAA 52 (L) 03/11/2016 0929   GFRNONAA >60 12/12/2012 0913   GFRAA >60 03/11/2016 0929   GFRAA >60 12/12/2012 0913    Assessment and Plan :  1. Depression, unspecified depression type Increase sertraline and follow up in month.  - sertraline (ZOLOFT) 100  MG tablet; Take 1.5 tablets (150 mg total) by mouth daily.  Dispense: 45 tablet; Refill: 12 PHQ9 score is 6 today. Significant anxiety. 2. Colon cancer screening  - Ambulatory referral to Gastroenterology  3. Need for influenza vaccination  - Flu vaccine HIGH DOSE PF  4. Need for pneumococcal vaccination  - Pneumococcal polysaccharide vaccine 23-valent greater than or equal to 2yo subcutaneous/IM  5. Generalized abdominal pain Start Zantac and refer to GI.  - ranitidine (ZANTAC) 150 MG capsule; Take 1 capsule (150 mg total) by mouth 2 (two) times daily.  Dispense: 60 capsule; Refill: 11 6.Trigeminal neuralgia Recurrent. Restart carbamazepine.  HPI, Exam, and A&P Transcribed under the direction and in the presence of Laiken Nohr L. Cranford Mon, MD  Electronically Signed: Webb Laws, CMA I have done the exam and reviewed the above chart and it is accurate to the best of my knowledge. Development worker, community has been used in this note in any air is in the dictation or transcription are unintentional.  [facility] 09/30/2016 3:26 PM

## 2016-10-18 IMAGING — CR DG CHEST 1V PORT
1 series · 2 of 2 positions shown · non-contrast
Comparison: Prior study from 05/26/2011

CLINICAL DATA: Initial evaluation for acute ST elevation MI.

EXAM:
PORTABLE CHEST - 1 VIEW

[Series 1: ap · 0.17mm/px · 2 of 2 slices shown]
[im 1/2]
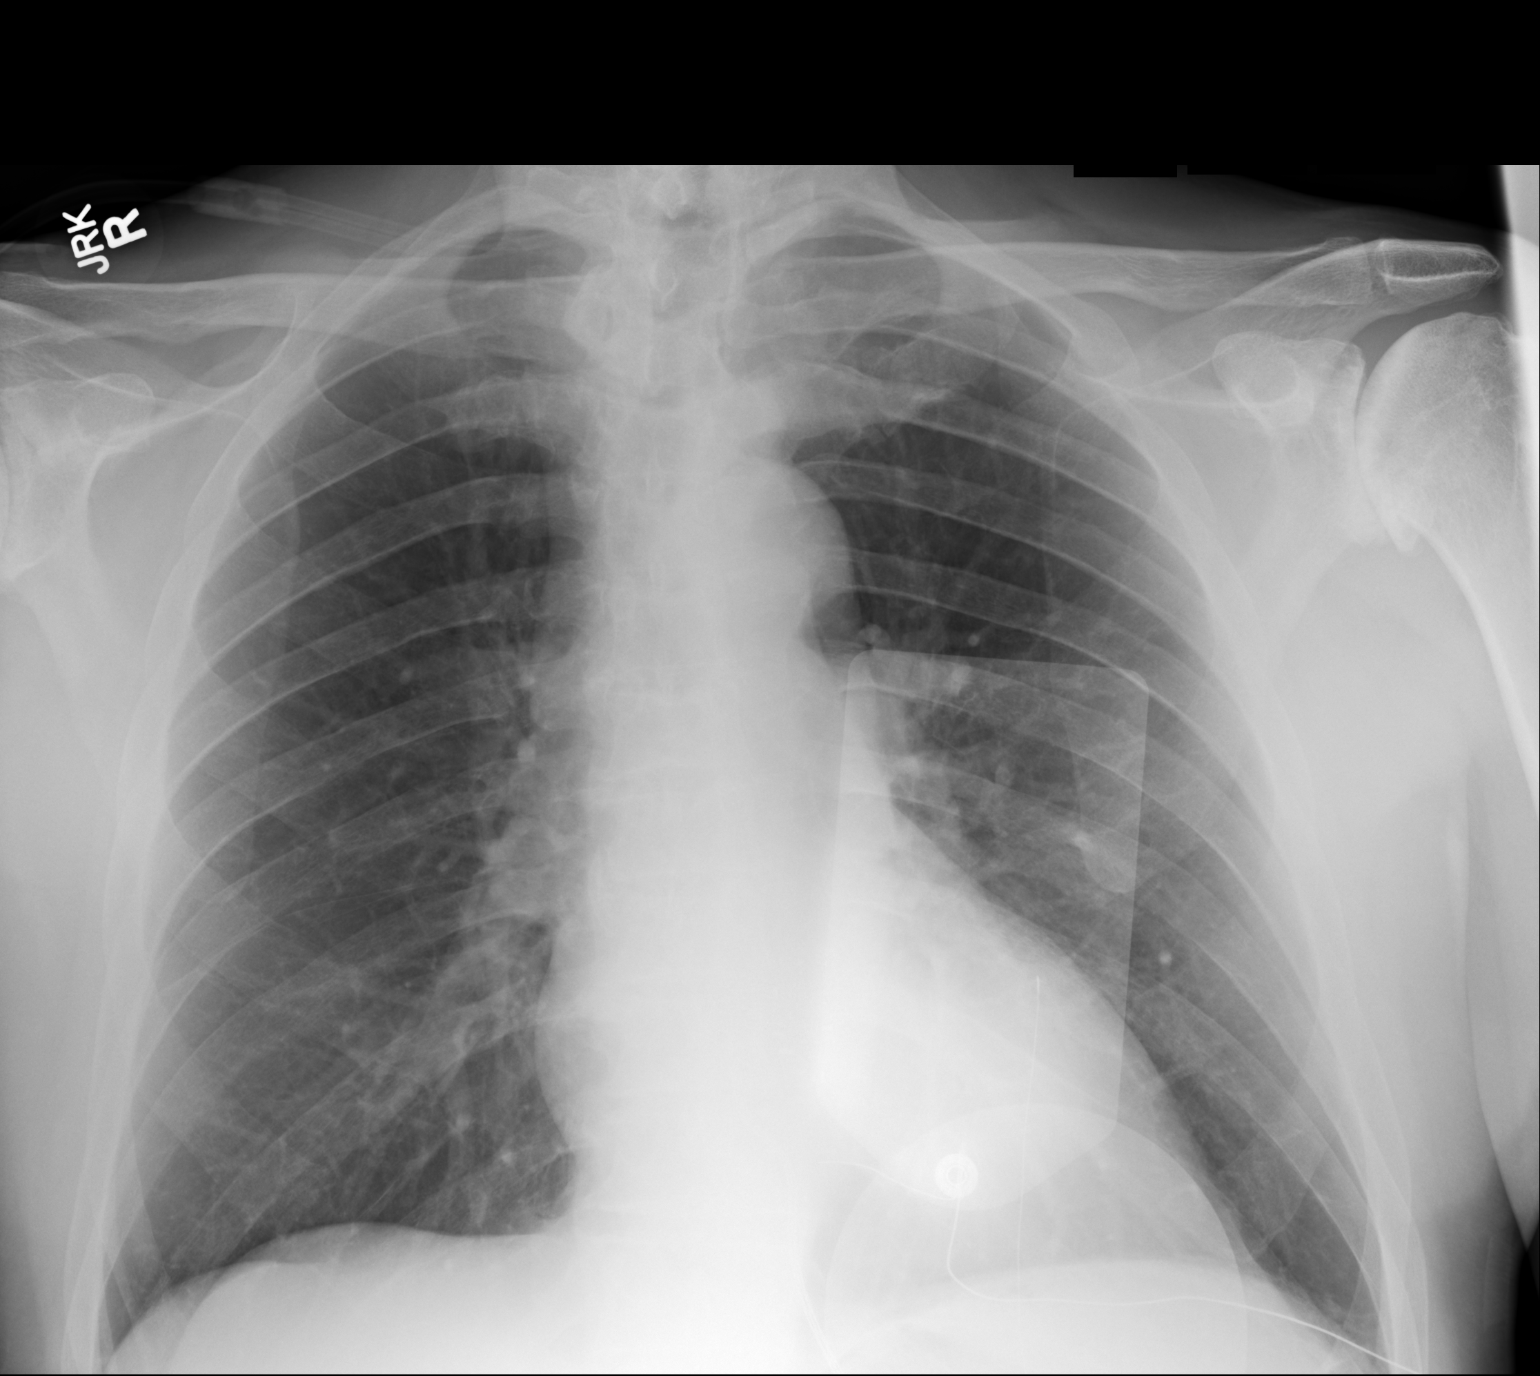
[im 2/2]
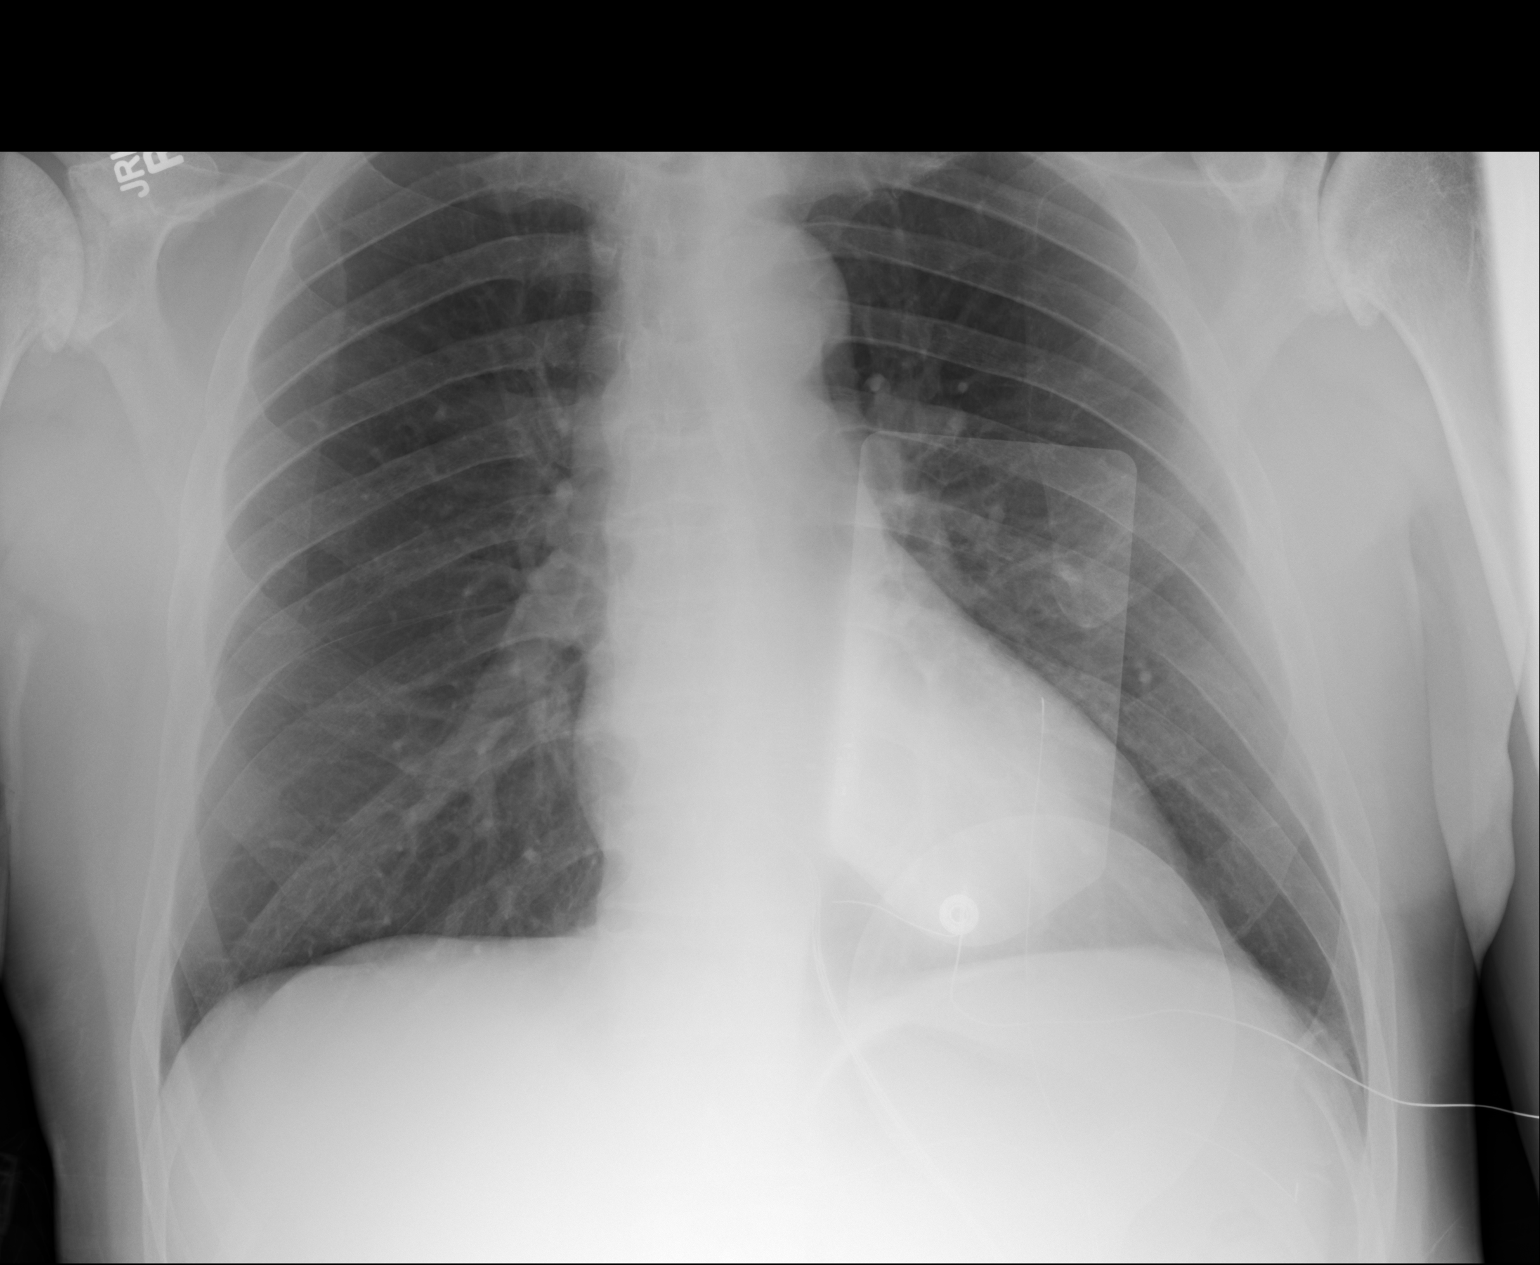

[2 of 2 positions shown; findings below may reference images not displayed]

FINDINGS: Mild cardiomegaly is stable from prior. Mediastinal silhouette
within normal limits. Defibrillator pads overlie the left hemi
thorax.

The lungs are normally inflated. No airspace consolidation, pleural
effusion, or pulmonary edema is identified. There is no
pneumothorax.

No acute osseous abnormality identified.
IMPRESSION: No radiographic evidence of active cardiopulmonary disease.

## 2016-10-28 ENCOUNTER — Ambulatory Visit (INDEPENDENT_AMBULATORY_CARE_PROVIDER_SITE_OTHER): Payer: PPO | Admitting: Family Medicine

## 2016-10-28 VITALS — BP 112/62 | HR 60 | Temp 97.9°F | Wt 169.6 lb

## 2016-10-28 DIAGNOSIS — N400 Enlarged prostate without lower urinary tract symptoms: Secondary | ICD-10-CM | POA: Diagnosis not present

## 2016-10-28 DIAGNOSIS — G500 Trigeminal neuralgia: Secondary | ICD-10-CM | POA: Diagnosis not present

## 2016-10-28 DIAGNOSIS — E782 Mixed hyperlipidemia: Secondary | ICD-10-CM

## 2016-10-28 DIAGNOSIS — R739 Hyperglycemia, unspecified: Secondary | ICD-10-CM | POA: Diagnosis not present

## 2016-10-28 DIAGNOSIS — F329 Major depressive disorder, single episode, unspecified: Secondary | ICD-10-CM

## 2016-10-28 DIAGNOSIS — R1084 Generalized abdominal pain: Secondary | ICD-10-CM

## 2016-10-28 DIAGNOSIS — F32A Depression, unspecified: Secondary | ICD-10-CM

## 2016-10-28 MED ORDER — CARBAMAZEPINE ER 200 MG PO CP12: 200.0000 mg | ORAL_CAPSULE | Freq: Two times a day (BID) | ORAL | 12 refills | Status: DC

## 2016-10-28 NOTE — Progress Notes (Signed)
Timothy Singleton  MRN: [id number] DOB: October 26, 1945  Subjective:  HPI  Patient is here for follow up. Last office visit was 09/30/16 and Sertraline was increased. PHQ9 score was 6. Patient feels like he is doing better and does not need any more adjustment with this at this time. For trigeminal neuralgia he was advised to re start Carbamazepine. He is taking this medication 2 times daily and is doing better with the symptoms.  He was advised on his last visit to take Zantac for abdominal pain and he has 2 times daily. He was referred to GI but has not heard anything still, Judson Roch is working on following up on that now.  His symptoms have improved a lot. Patient Active Problem List   Diagnosis Date Noted  . Arthritis of shoulder region, degenerative 09/10/2015  . Clinical depression 07/22/2015  . Acid reflux 07/22/2015  . IBS (irritable bowel syndrome) 07/22/2015  . AK (actinic keratosis) 07/22/2015  . TIA (transient ischemic attack) 07/22/2015  . OSA (obstructive sleep apnea) 07/22/2015  . Idiopathic trigeminal neuralgia 07/22/2015  . CAD in native artery 07/22/2015  . GERD (gastroesophageal reflux disease) 07/22/2015  . Allergic rhinitis 07/22/2015  . Arteriosclerosis of coronary artery 10/16/2014  . Essential (primary) hypertension 08/21/2014  . Combined fat and carbohydrate induced hyperlipemia 08/21/2014  . Heart attack 08/13/2014  . Benign prostatic hypertrophy without urinary obstruction 01/16/2014  . Renal colic 0000000    Past Medical History:  Diagnosis Date  . Arthritis   . Cancer ([place])    melanoma / knee  . Depression   . GERD (gastroesophageal reflux disease)   . Hyperlipemia   . Hypertension   . Past heart attack   . Sleep apnea     Social History   Social History  . Marital status: Married    Spouse name: N/A  . Number of children: N/A  . Years of education: N/A   Occupational History  . Not on file.   Social History Main Topics  . Smoking  status: Former Research scientist (life sciences)  . Smokeless tobacco: Never Used     Comment: quit 40 years ago   . Alcohol use 0.0 oz/week     Comment: 2-4 times per month  . Drug use: Unknown  . Sexual activity: Not on file   Other Topics Concern  . Not on file   Social History Narrative  . No narrative on file    Outpatient Encounter Prescriptions as of 10/28/2016  Medication Sig Note  . aspirin 81 MG tablet Take 81 mg by mouth daily.   Marland Kitchen atorvastatin (LIPITOR) 80 MG tablet Take 1 tablet (80 mg total) by mouth daily.   . carbamazepine (CARBATROL) 200 MG 12 hr capsule Take 1 capsule (200 mg total) by mouth 2 (two) times daily.   . Ferrous Sulfate (IRON) 325 (65 Fe) MG TABS Take by mouth.   Marland Kitchen lisinopril (PRINIVIL,ZESTRIL) 20 MG tablet  09/30/2016: Received from: External Pharmacy  . ranitidine (ZANTAC) 150 MG capsule Take 1 capsule (150 mg total) by mouth 2 (two) times daily.   . sertraline (ZOLOFT) 100 MG tablet Take 1.5 tablets (150 mg total) by mouth daily.   . tamsulosin (FLOMAX) 0.4 MG CAPS capsule TAKE 1 CAPSULE (0.4 MG TOTAL) BY MOUTH DAILY.   . [DISCONTINUED] chlorhexidine (PERIDEX) 0.12 % solution  09/30/2016: Received from: External Pharmacy  . [DISCONTINUED] diflunisal (DOLOBID) 500 MG TABS tablet  09/30/2016: Received from: External Pharmacy  . [DISCONTINUED] meloxicam (MOBIC) 15 MG tablet Take  1 tablet by mouth 2 (two) times daily. 06/25/2016: Received from: [facility]   No facility-administered encounter medications on file as of 10/28/2016.     No Known Allergies  Review of Systems  Constitutional: Negative.   HENT: Negative.   Respiratory: Negative.   Cardiovascular: Negative.   Gastrointestinal: Positive for abdominal pain.       Better  Musculoskeletal: Positive for joint pain.  Skin: Negative.   Neurological: Positive for tingling.       Better  Endo/Heme/Allergies: Negative.   Psychiatric/Behavioral:       Better    Objective:  BP 112/62   Pulse 60   Temp 97.9 F (36.6  C)   Wt 169 lb 9.6 oz (76.9 kg)   BMI 23.65 kg/m   Physical Exam  Constitutional: He is oriented to person, place, and time and well-developed, well-nourished, and in no distress.  HENT:  Head: Normocephalic and atraumatic.  Eyes: Conjunctivae are normal. Pupils are equal, round, and reactive to light.  Neck: Normal range of motion. Neck supple.  Cardiovascular: Normal rate, regular rhythm, normal heart sounds and intact distal pulses.   No murmur heard. Pulmonary/Chest: Effort normal and breath sounds normal. No respiratory distress. He has no wheezes.  Musculoskeletal: He exhibits no edema or tenderness.  Neurological: He is alert and oriented to person, place, and time.  Psychiatric: Mood, memory, affect and judgment normal.    Assessment and Plan :  1. Depression, unspecified depression type/Mild Stable. Better.  2. Generalized abdominal pain Improved. Better. Dr Georgeann Oppenheim office will contact patient to set up colonoscopy and endoscopy if needed. Continue ranitidine and discuss with GI on how long for this regimen.  3. Idiopathic trigeminal neuralgia Better on Carbamazepine. Refill given. Follow up on this in 6 months and decide if need to continue or stop this medication at that time.  4. Benign prostatic hyperplasia without urinary obstruction Follows urologist.  5. Combined fat and carbohydrate induced hyperlipemia 6.s/p shoulder surgery  HPI, Exam and A&P transcribed under direction and in the presence of Miguel Aschoff, MD.

## 2016-11-02 DIAGNOSIS — R739 Hyperglycemia, unspecified: Secondary | ICD-10-CM | POA: Diagnosis not present

## 2016-11-02 DIAGNOSIS — F329 Major depressive disorder, single episode, unspecified: Secondary | ICD-10-CM | POA: Diagnosis not present

## 2016-11-02 DIAGNOSIS — R1084 Generalized abdominal pain: Secondary | ICD-10-CM | POA: Diagnosis not present

## 2016-11-02 DIAGNOSIS — E782 Mixed hyperlipidemia: Secondary | ICD-10-CM | POA: Diagnosis not present

## 2016-11-02 DIAGNOSIS — G500 Trigeminal neuralgia: Secondary | ICD-10-CM | POA: Diagnosis not present

## 2016-11-03 ENCOUNTER — Telehealth: Payer: Self-pay

## 2016-11-03 LAB — COMPREHENSIVE METABOLIC PANEL
ALK PHOS: 88 IU/L (ref 39–117)
ALT: 17 IU/L (ref 0–44)
AST: 21 IU/L (ref 0–40)
Albumin/Globulin Ratio: 2.1 (ref 1.2–2.2)
Albumin: 4.2 g/dL (ref 3.5–4.8)
BILIRUBIN TOTAL: 0.4 mg/dL (ref 0.0–1.2)
BUN/Creatinine Ratio: 20 (ref 10–24)
BUN: 19 mg/dL (ref 8–27)
CHLORIDE: 103 mmol/L (ref 96–106)
CO2: 27 mmol/L (ref 18–29)
CREATININE: 0.96 mg/dL (ref 0.76–1.27)
Calcium: 9.6 mg/dL (ref 8.6–10.2)
GFR calc Af Amer: 92 mL/min/{1.73_m2} (ref >59)
GFR calc non Af Amer: 79 mL/min/{1.73_m2} (ref >59)
Globulin, Total: 2 g/dL (ref 1.5–4.5)
Glucose: 98 mg/dL (ref 65–99)
Potassium: 4.3 mmol/L (ref 3.5–5.2)
Sodium: 143 mmol/L (ref 134–144)
Total Protein: 6.2 g/dL (ref 6.0–8.5)

## 2016-11-03 LAB — HEMOGLOBIN A1C
ESTIMATED AVERAGE GLUCOSE: 100 mg/dL
HEMOGLOBIN A1C: 5.1 % (ref 4.8–5.6)

## 2016-11-03 LAB — CBC WITH DIFFERENTIAL/PLATELET
Basophils Absolute: 0 10*3/uL (ref 0.0–0.2)
Basos: 1 %
EOS (ABSOLUTE): 0.3 10*3/uL (ref 0.0–0.4)
Eos: 5 %
HEMOGLOBIN: 15 g/dL (ref 13.0–17.7)
Hematocrit: 46.1 % (ref 37.5–51.0)
Immature Grans (Abs): 0 10*3/uL (ref 0.0–0.1)
Immature Granulocytes: 0 %
LYMPHS ABS: 1.8 10*3/uL (ref 0.7–3.1)
Lymphs: 31 %
MCH: 30.9 pg (ref 26.6–33.0)
MCHC: 32.5 g/dL (ref 31.5–35.7)
MCV: 95 fL (ref 79–97)
MONOCYTES: 11 %
MONOS ABS: 0.6 10*3/uL (ref 0.1–0.9)
Neutrophils Absolute: 3.2 10*3/uL (ref 1.4–7.0)
Neutrophils: 52 %
PLATELETS: 242 10*3/uL (ref 150–379)
RBC: 4.86 x10E6/uL (ref 4.14–5.80)
RDW: 14.3 % (ref 12.3–15.4)
WBC: 6 10*3/uL (ref 3.4–10.8)

## 2016-11-03 LAB — TSH: TSH: 1.99 u[IU]/mL (ref 0.450–4.500)

## 2016-11-03 LAB — LIPID PANEL WITH LDL/HDL RATIO
Cholesterol, Total: 126 mg/dL (ref 100–199)
HDL: 42 mg/dL (ref >39)
LDL Calculated: 66 mg/dL (ref 0–99)
LDl/HDL Ratio: 1.6 ratio units (ref 0.0–3.6)
TRIGLYCERIDES: 88 mg/dL (ref 0–149)
VLDL Cholesterol Cal: 18 mg/dL (ref 5–40)

## 2016-11-03 NOTE — Telephone Encounter (Signed)
Left message to call back  

## 2016-11-03 NOTE — Telephone Encounter (Signed)
-----   Message from Jerrol Banana., MD sent at 11/03/2016  2:43 PM EST ----- Labs okay

## 2016-11-06 DIAGNOSIS — C44729 Squamous cell carcinoma of skin of left lower limb, including hip: Secondary | ICD-10-CM | POA: Diagnosis not present

## 2016-11-06 DIAGNOSIS — L905 Scar conditions and fibrosis of skin: Secondary | ICD-10-CM | POA: Diagnosis not present

## 2016-11-09 NOTE — Telephone Encounter (Signed)
Pt advised-aa 

## 2016-11-10 ENCOUNTER — Other Ambulatory Visit: Payer: Self-pay

## 2016-11-12 ENCOUNTER — Ambulatory Visit: Payer: Self-pay

## 2016-11-13 ENCOUNTER — Ambulatory Visit (INDEPENDENT_AMBULATORY_CARE_PROVIDER_SITE_OTHER): Payer: PPO

## 2016-11-13 DIAGNOSIS — Z0000 Encounter for general adult medical examination without abnormal findings: Secondary | ICD-10-CM | POA: Diagnosis not present

## 2016-11-13 DIAGNOSIS — Z1159 Encounter for screening for other viral diseases: Secondary | ICD-10-CM | POA: Diagnosis not present

## 2016-11-13 NOTE — Patient Instructions (Signed)

## 2016-11-13 NOTE — Progress Notes (Signed)
Subjective:   Timothy Singleton is a 71 y.o. male who presents for Medicare Annual/Subsequent preventive examination.  Review of Systems:  N/A  Cardiac Risk Factors include: advanced age (>24men, >55 women);dyslipidemia;hypertension;male gender     Objective:    Vitals: There were no vitals taken for this visit.  There is no height or weight on file to calculate BMI.  Tobacco History  Smoking Status  . Former Smoker  Smokeless Tobacco  . Former Systems developer    Comment: quit 40 years ago      Counseling given: Not Answered   Past Medical History:  Diagnosis Date  . Arthritis   . Cancer ([place])    melanoma / knee  . Depression   . GERD (gastroesophageal reflux disease)   . Hyperlipemia   . Hypertension   . Past heart attack   . Sleep apnea    Past Surgical History:  Procedure Laterality Date  . BUNIONECTOMY    . CORONARY STENT PLACEMENT    . EXTRACORPOREAL SHOCK WAVE LITHOTRIPSY    . HERNIA REPAIR     inguinal-right  . KNEE SURGERY Right   . TONSILLECTOMY    . TOTAL SHOULDER REPLACEMENT    . UPPER GI ENDOSCOPY  10/18/01   hiatus hernia  . VASECTOMY    . WRIST SURGERY     Family History  Problem Relation Age of Onset  . Cancer Mother   . Dementia Mother   . Stroke Father   . Heart disease Father   . Hypertension Father   . Breast cancer Sister   . Parkinson's disease Brother   . Hypertension Brother   . Melanoma Maternal Grandmother   . Bladder Cancer Neg Hx   . Prostate cancer Neg Hx   . Kidney cancer Neg Hx    History  Sexual Activity  . Sexual activity: Not on file    Outpatient Encounter Prescriptions as of 11/13/2016  Medication Sig  . aspirin 81 MG tablet Take 81 mg by mouth daily.  Marland Kitchen atorvastatin (LIPITOR) 80 MG tablet Take 1 tablet (80 mg total) by mouth daily.  . carbamazepine (CARBATROL) 200 MG 12 hr capsule Take 1 capsule (200 mg total) by mouth 2 (two) times daily.  . Ferrous Sulfate (IRON) 325 (65 Fe) MG TABS Take by mouth.  Marland Kitchen  lisinopril (PRINIVIL,ZESTRIL) 20 MG tablet   . ranitidine (ZANTAC) 150 MG capsule Take 1 capsule (150 mg total) by mouth 2 (two) times daily.  . sertraline (ZOLOFT) 100 MG tablet Take 1.5 tablets (150 mg total) by mouth daily.  . tamsulosin (FLOMAX) 0.4 MG CAPS capsule TAKE 1 CAPSULE (0.4 MG TOTAL) BY MOUTH DAILY.   No facility-administered encounter medications on file as of 11/13/2016.     Activities of Daily Living In your present state of health, do you have any difficulty performing the following activities: 11/13/2016 09/30/2016  Hearing? N N  Vision? N N  Difficulty concentrating or making decisions? N N  Walking or climbing stairs? N N  Dressing or bathing? N N  Doing errands, shopping? N N  Preparing Food and eating ? N -  Using the Toilet? N -  In the past six months, have you accidently leaked urine? N -  Do you have problems with loss of bowel control? N -  Managing your Medications? N -  Managing your Finances? N -  Housekeeping or managing your Housekeeping? N -  Some recent data might be hidden    Patient Care Team:  Davyn Elsasser Maceo Pro., MD as PCP - General (Family Medicine) Estill Cotta, MD as Consulting Physician (Ophthalmology) Corey Skains, MD as Consulting Physician (Cardiology) Kennieth Francois, MD as Consulting Physician (Dermatology) Hollice Espy, MD as Consulting Physician (Urology)   Assessment:     Exercise Activities and Dietary recommendations Current Exercise Habits: Structured exercise class, Type of exercise: Other - see comments;stretching (eliptical), Time (Minutes): > 60, Frequency (Times/Week): 4, Weekly Exercise (Minutes/Week): 0, Intensity: Moderate, Exercise limited by: None identified  Goals    . Increase water intake          Starting 11/13/16, I will increase my water intake to 4 glasses a day.      Fall Risk Fall Risk  11/13/2016 07/23/2015  Falls in the past year? No No   Depression Screen PHQ 2/9 Scores 11/13/2016  09/30/2016 07/23/2015  PHQ - 2 Score 0 0 0  PHQ- 9 Score - 5 -    Cognitive Function     6CIT Screen 11/13/2016  What Year? 0 points  What month? 0 points  What time? 0 points  Count back from 20 0 points  Months in reverse 0 points  Repeat phrase 0 points  Total Score 0    Immunization History  Administered Date(s) Administered  . Influenza, High Dose Seasonal PF 07/23/2015, 09/30/2016  . Pneumococcal Conjugate-13 07/11/2014  . Pneumococcal Polysaccharide-23 09/30/2016  . Td 09/20/2003  . Zoster 01/28/2010   Screening Tests Health Maintenance  Topic Date Due  . Hepatitis C Screening  1945/12/26  . TETANUS/TDAP  10/19/2017 (Originally 09/19/2013)  . COLONOSCOPY  11/12/2020  . INFLUENZA VACCINE  Completed  . ZOSTAVAX  Completed  . PNA vac Low Risk Adult  Completed      Plan:  I have personally reviewed and addressed the Medicare Annual Wellness questionnaire and have noted the following in the patient's chart:  A. Medical and social history B. Use of alcohol, tobacco or illicit drugs  C. Current medications and supplements D. Functional ability and status E.  Nutritional status F.  Physical activity G. Advance directives H. List of other physicians I.  Hospitalizations, surgeries, and ER visits in previous 12 months J.  [place] such as hearing and vision if needed, cognitive and depression L. Referrals and appointments - none  In addition, I have reviewed and discussed with patient certain preventive protocols, quality metrics, and best practice recommendations. A written personalized care plan for preventive services as well as general preventive health recommendations were provided to patient.  See attached scanned questionnaire for additional information.   Signed,  Fabio Neighbors, LPN Nurse Health Advisor   MD Recommendations: None I have reviewed the health advisors note, was  available for consultation and I agree with documentation and  plan. Miguel Aschoff MD [facility]

## 2016-11-16 ENCOUNTER — Other Ambulatory Visit: Payer: Self-pay

## 2016-11-16 ENCOUNTER — Encounter: Payer: Self-pay | Admitting: *Deleted

## 2016-11-16 MED ORDER — PEG 3350-KCL-NABCB-NACL-NASULF 236 G PO SOLR: ORAL | 0 refills | Status: DC

## 2016-11-17 ENCOUNTER — Encounter: Payer: Self-pay | Admitting: Anesthesiology

## 2016-11-17 ENCOUNTER — Ambulatory Visit: Payer: PPO | Admitting: Anesthesiology

## 2016-11-17 ENCOUNTER — Encounter
Admission: RE | Disposition: A | Discharge status: Home / Self Care | Payer: Self-pay | Source: Ambulatory Visit | Attending: Gastroenterology

## 2016-11-17 ENCOUNTER — Ambulatory Visit
Admission: RE | Admit: 2016-11-17 | Discharge: 2016-11-17 | Disposition: A | Discharge status: Home / Self Care | Payer: PPO | Source: Ambulatory Visit | Attending: Gastroenterology | Admitting: Gastroenterology

## 2016-11-17 DIAGNOSIS — K648 Other hemorrhoids: Secondary | ICD-10-CM | POA: Insufficient documentation

## 2016-11-17 DIAGNOSIS — Z79899 Other long term (current) drug therapy: Secondary | ICD-10-CM | POA: Diagnosis not present

## 2016-11-17 DIAGNOSIS — Z1211 Encounter for screening for malignant neoplasm of colon: Secondary | ICD-10-CM | POA: Diagnosis not present

## 2016-11-17 DIAGNOSIS — K219 Gastro-esophageal reflux disease without esophagitis: Secondary | ICD-10-CM | POA: Insufficient documentation

## 2016-11-17 DIAGNOSIS — K5730 Diverticulosis of large intestine without perforation or abscess without bleeding: Secondary | ICD-10-CM | POA: Diagnosis not present

## 2016-11-17 DIAGNOSIS — E785 Hyperlipidemia, unspecified: Secondary | ICD-10-CM | POA: Insufficient documentation

## 2016-11-17 DIAGNOSIS — Z87891 Personal history of nicotine dependence: Secondary | ICD-10-CM | POA: Diagnosis not present

## 2016-11-17 DIAGNOSIS — I10 Essential (primary) hypertension: Secondary | ICD-10-CM | POA: Diagnosis not present

## 2016-11-17 DIAGNOSIS — Z7982 Long term (current) use of aspirin: Secondary | ICD-10-CM | POA: Insufficient documentation

## 2016-11-17 DIAGNOSIS — I252 Old myocardial infarction: Secondary | ICD-10-CM | POA: Insufficient documentation

## 2016-11-17 DIAGNOSIS — K562 Volvulus: Secondary | ICD-10-CM | POA: Insufficient documentation

## 2016-11-17 DIAGNOSIS — K5790 Diverticulosis of intestine, part unspecified, without perforation or abscess without bleeding: Secondary | ICD-10-CM | POA: Diagnosis not present

## 2016-11-17 DIAGNOSIS — K640 First degree hemorrhoids: Secondary | ICD-10-CM | POA: Diagnosis not present

## 2016-11-17 DIAGNOSIS — M1990 Unspecified osteoarthritis, unspecified site: Secondary | ICD-10-CM | POA: Insufficient documentation

## 2016-11-17 DIAGNOSIS — G4730 Sleep apnea, unspecified: Secondary | ICD-10-CM | POA: Diagnosis not present

## 2016-11-17 DIAGNOSIS — Z85820 Personal history of malignant melanoma of skin: Secondary | ICD-10-CM | POA: Insufficient documentation

## 2016-11-17 DIAGNOSIS — Z86010 Personal history of colonic polyps: Secondary | ICD-10-CM | POA: Diagnosis not present

## 2016-11-17 DIAGNOSIS — F329 Major depressive disorder, single episode, unspecified: Secondary | ICD-10-CM | POA: Diagnosis not present

## 2016-11-17 DIAGNOSIS — Z955 Presence of coronary angioplasty implant and graft: Secondary | ICD-10-CM | POA: Insufficient documentation

## 2016-11-17 HISTORY — PX: COLONOSCOPY WITH PROPOFOL: SHX5780

## 2016-11-17 SURGERY — COLONOSCOPY WITH PROPOFOL
Anesthesia: General

## 2016-11-17 MED ORDER — PROPOFOL 500 MG/50ML IV EMUL
INTRAVENOUS | Status: DC | PRN
  Administered 2016-11-17: 180 ug/kg/min via INTRAVENOUS

## 2016-11-17 MED ORDER — SODIUM CHLORIDE 0.9 % IV SOLN
INTRAVENOUS | Status: DC
  Administered 2016-11-17: 1000 mL via INTRAVENOUS

## 2016-11-17 MED ORDER — EPHEDRINE SULFATE 50 MG/ML IJ SOLN
INTRAMUSCULAR | Status: DC | PRN
  Administered 2016-11-17 (×2): 10 mg via INTRAVENOUS

## 2016-11-17 MED ORDER — PROPOFOL 500 MG/50ML IV EMUL
INTRAVENOUS | Status: AC
  Filled 2016-11-17: qty 50

## 2016-11-17 MED ORDER — EPHEDRINE 5 MG/ML INJ
INTRAVENOUS | Status: AC
  Filled 2016-11-17: qty 10

## 2016-11-17 MED ORDER — PROPOFOL 10 MG/ML IV BOLUS
INTRAVENOUS | Status: DC | PRN
  Administered 2016-11-17: 70 mg via INTRAVENOUS

## 2016-11-17 MED ORDER — LIDOCAINE 2% (20 MG/ML) 5 ML SYRINGE
INTRAMUSCULAR | Status: DC | PRN
  Administered 2016-11-17: 50 mg via INTRAVENOUS

## 2016-11-17 MED ORDER — LIDOCAINE HCL (PF) 2 % IJ SOLN
INTRAMUSCULAR | Status: AC
  Filled 2016-11-17: qty 2

## 2016-11-17 NOTE — Anesthesia Post-op Follow-up Note (Cosign Needed)
Anesthesia QCDR form completed.        

## 2016-11-17 NOTE — Anesthesia Preprocedure Evaluation (Signed)
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 right:   Pulmonary neg pulmonary ROS, former smoker,           Cardiovascular Exercise Tolerance: Good hypertension, (-) angina+ CAD, + Past MI and + Cardiac Stents  (-) CABG (-) dysrhythmias (-) Valvular Problems/Murmurs     Neuro/Psych PSYCHIATRIC DISORDERS (Depression)    GI/Hepatic Neg liver ROS, GERD  Controlled,  Endo/Other  negative endocrine ROS  Renal/GU Renal disease (kidney stones)  negative genitourinary   Musculoskeletal   Abdominal   Peds  Hematology negative hematology ROS (+)   Anesthesia Other Findings Past Medical History: No date: Arthritis No date: Cancer ([place])     Comment: melanoma / knee No date: Depression No date: GERD (gastroesophageal reflux disease) No date: Hyperlipemia No date: Hypertension No date: Past heart attack No date: Sleep apnea   Reproductive/Obstetrics negative OB ROS                             Anesthesia Physical Anesthesia Plan  ASA: II  Anesthesia Plan: General   Post-op Pain Management:    Induction:   Airway Management Planned:   Additional Equipment:   Intra-op Plan:   Post-operative Plan:   Informed Consent: I have reviewed the patients History and Physical, chart, labs and discussed the procedure including the risks, benefits and alternatives for the proposed anesthesia with the patient or authorized representative who has indicated his/her understanding and  acceptance.   Dental Advisory Given  Plan Discussed with: Anesthesiologist, CRNA and Surgeon  Anesthesia Plan Comments:         Anesthesia Quick Evaluation

## 2016-11-17 NOTE — Op Note (Signed)
[facility] Gastroenterology Patient Name: Timothy Singleton Procedure Date: 11/17/2016 9:39 AM MRN: [id number] Account #: [id number] Date of Birth: October 14, 1946 Admit Type: Outpatient Age: 71 Room: [facility] ENDO ROOM 4 Gender: Male Note Status: Finalized Procedure:            Colonoscopy Indications:          Surveillance: Personal history of adenomatous polyps on                        last colonoscopy > 5 years ago Providers:            Jonathon Bellows MD, MD Referring MD:         Janine Ores. Rosanna Randy, MD (Referring MD) Medicines:            Monitored Anesthesia Care Complications:        No immediate complications. Procedure:            Pre-Anesthesia Assessment:                       - Prior to the procedure, a History and Physical was                        performed, and patient medications, allergies and                        sensitivities were reviewed. The patient's tolerance of                        previous anesthesia was reviewed.                       - The risks and benefits of the procedure and the                        sedation options and risks were discussed with the                        patient. All questions were answered and informed                        consent was obtained.                       - The risks and benefits of the procedure and the                        sedation options and risks were discussed with the                        patient. All questions were answered and informed                        consent was obtained.                       - ASA Grade Assessment: II - A patient with mild                        systemic disease.  After obtaining informed consent, the colonoscope was                        passed under direct vision. Throughout the procedure,                        the patient's blood pressure, pulse, and oxygen                        saturations were monitored continuously. The     Colonoscope was introduced through the anus and                        advanced to the the cecum, identified by the                        appendiceal orifice, IC valve and transillumination.                        The colonoscopy was performed with moderate difficulty                        due to a tortuous colon. Successful completion of the                        procedure was aided by {skip}changing position of the                        patient. The patient tolerated the procedure well. The                        quality of the bowel preparation was adequate. Findings:      A few medium-mouthed diverticula were found in the sigmoid colon.      Non-bleeding internal hemorrhoids were found during retroflexion. The       hemorrhoids were medium-sized and Grade I (internal hemorrhoids that do       not prolapse).      The exam was otherwise without abnormality on direct and retroflexion       views. Impression:           - Diverticulosis in the sigmoid colon.                       - Non-bleeding internal hemorrhoids.                       - The examination was otherwise normal on direct and                        retroflexion views.                       - No specimens collected. Recommendation:       - Discharge patient to home (with escort).                       - Resume previous diet.                       - Continue present medications.                       -  Repeat colonoscopy in 5 years for surveillance. Procedure Code(s):    --- Professional ---                       [id number], Colorectal cancer screening; colonoscopy on                        individual at high risk Diagnosis Code(s):    --- Professional ---                       Z86.010, Personal history of colonic polyps                       K64.0, First degree hemorrhoids                       K57.30, Diverticulosis of large intestine without                        perforation or abscess without bleeding CPT copyright  2016 American Medical Association. All rights reserved. The codes documented in this report are preliminary and upon coder review may  be revised to meet current compliance requirements. Jonathon Bellows, MD Jonathon Bellows MD, MD 11/17/2016 10:22:53 AM This report has been signed electronically. Number of Addenda: 0 Note Initiated On: 11/17/2016 9:39 AM Scope Withdrawal Time: 0 hours 13 minutes 28 seconds  Total Procedure Duration: 0 hours 30 minutes 32 seconds       [facility]

## 2016-11-17 NOTE — H&P (Signed)
Jonathon Bellows MD [name], [place], [place] [address] Phone: [phone] Fax : [phone]  Primary Care Physician:  Wilhemena Durie, MD Primary Gastroenterologist:  Dr. Jonathon Bellows   Pre-Procedure History & Physical: HPI:  Timothy Singleton is a 71 y.o. male is here for an colonoscopy.   Past Medical History:  Diagnosis Date  . Arthritis   . Cancer ([place])    melanoma / knee  . Depression   . GERD (gastroesophageal reflux disease)   . Hyperlipemia   . Hypertension   . Past heart attack   . Sleep apnea     Past Surgical History:  Procedure Laterality Date  . BUNIONECTOMY    . CORONARY STENT PLACEMENT    . EXTRACORPOREAL SHOCK WAVE LITHOTRIPSY    . HERNIA REPAIR     inguinal-right  . JOINT REPLACEMENT     total shoulder replacement  . KNEE SURGERY Right   . TONSILLECTOMY    . TOTAL SHOULDER REPLACEMENT    . UPPER GI ENDOSCOPY  10/18/01   hiatus hernia  . VASECTOMY    . WRIST SURGERY      Prior to Admission medications   Medication Sig Start Date End Date Taking? Authorizing Provider  aspirin 81 MG tablet Take 81 mg by mouth daily.   Yes Historical Provider, MD  atorvastatin (LIPITOR) 80 MG tablet Take 1 tablet (80 mg total) by mouth daily. 01/21/16  Yes Richard Maceo Pro., MD  carbamazepine (CARBATROL) 200 MG 12 hr capsule Take 1 capsule (200 mg total) by mouth 2 (two) times daily. 10/28/16  Yes Richard Maceo Pro., MD  Ferrous Sulfate (IRON) 325 (65 Fe) MG TABS Take by mouth.   Yes Historical Provider, MD  lisinopril (PRINIVIL,ZESTRIL) 20 MG tablet  09/28/16  Yes Historical Provider, MD  polyethylene glycol (GOLYTELY) 236 g solution Drink one 8 oz glass every 20 mins until stools are clear 11/16/16  Yes Jonathon Bellows, MD  ranitidine (ZANTAC) 150 MG capsule Take 1 capsule (150 mg total) by mouth 2 (two) times daily. 09/30/16  Yes Richard Maceo Pro., MD  sertraline (ZOLOFT) 100 MG tablet Take 1.5 tablets (150 mg total) by mouth daily. 09/30/16 01/25/18  Yes Richard L Cranford Mon., MD  tamsulosin (FLOMAX) 0.4 MG CAPS capsule TAKE 1 CAPSULE (0.4 MG TOTAL) BY MOUTH DAILY. 07/27/16  Yes Richard Maceo Pro., MD    Allergies as of 11/10/2016  . (No Known Allergies)    Family History  Problem Relation Age of Onset  . Cancer Mother   . Dementia Mother   . Stroke Father   . Heart disease Father   . Hypertension Father   . Breast cancer Sister   . Parkinson's disease Brother   . Hypertension Brother   . Melanoma Maternal Grandmother   . Bladder Cancer Neg Hx   . Prostate cancer Neg Hx   . Kidney cancer Neg Hx     Social History   Social History  . Marital status: Married    Spouse name: N/A  . Number of children: N/A  . Years of education: N/A   Occupational History  . Not on file.   Social History Main Topics  . Smoking status: Former Research scientist (life sciences)  . Smokeless tobacco: Former Systems developer     Comment: quit 40 years ago   . Alcohol use 0.0 oz/week     Comment: 2-4 times per month  . Drug use: No  . Sexual activity: Not on file  Other Topics Concern  . Not on file   Social History Narrative  . No narrative on file    Review of Systems: See HPI, otherwise negative ROS  Physical Exam: BP (!) 144/68   Pulse 62   Temp 97.3 F (36.3 C) (Tympanic)   Resp 16   Ht 5\' 11"  (1.803 m)   Wt 165 lb (74.8 kg)   SpO2 99%   BMI 23.01 kg/m  General:   Alert,  pleasant and cooperative in NAD Head:  Normocephalic and atraumatic. Neck:  Supple; no masses or thyromegaly. Lungs:  Clear throughout to auscultation.    Heart:  Regular rate and rhythm. Abdomen:  Soft, nontender and nondistended. Normal bowel sounds, without guarding, and without rebound.   Neurologic:  Alert and  oriented x4;  grossly normal neurologically.  Impression/Plan: Timothy Singleton is here for an colonoscopy to be performed for surveillance due to personal history of polyps  Risks, benefits, limitations, and alternatives regarding  colonoscopy have been  reviewed with the patient.  Questions have been answered.  All parties agreeable.   Jonathon Bellows, MD  11/17/2016, 9:37 AM

## 2016-11-17 NOTE — Transfer of Care (Signed)
Immediate Anesthesia Transfer of Care Note  Patient: Timothy Singleton  Procedure(s) Performed: Procedure(s): COLONOSCOPY WITH PROPOFOL (N/A)  Patient Location: Endoscopy Unit  Anesthesia Type:General  Level of Consciousness: sedated  Airway & Oxygen Therapy: Patient connected to nasal cannula oxygen  Post-op Assessment: Post -op Vital signs reviewed and stable  Post vital signs: stable  Last Vitals:  Vitals:   11/17/16 0858  BP: (!) 144/68  Pulse: 62  Resp: 16  Temp: 36.3 C    Last Pain:  Vitals:   11/17/16 0858  TempSrc: Tympanic         Complications: No apparent anesthesia complications

## 2016-11-17 NOTE — Anesthesia Postprocedure Evaluation (Signed)
Anesthesia Post Note  Patient: Timothy Singleton  Procedure(s) Performed: Procedure(s) (LRB): COLONOSCOPY WITH PROPOFOL (N/A)  Patient location during evaluation: Endoscopy Anesthesia Type: General Level of consciousness: awake and alert Pain management: pain level controlled Vital Signs Assessment: post-procedure vital signs reviewed and stable Respiratory status: spontaneous breathing, nonlabored ventilation, respiratory function stable and patient connected to nasal cannula oxygen Cardiovascular status: blood pressure returned to baseline and stable Postop Assessment: no signs of nausea or vomiting Anesthetic complications: no     Last Vitals:  Vitals:   11/17/16 1102 11/17/16 1112  BP: 127/79   Pulse: 63 (!) 31  Resp: 18   Temp:      Last Pain:  Vitals:   11/17/16 1023  TempSrc: Tympanic  PainSc:                  Martha Clan

## 2016-11-18 ENCOUNTER — Encounter: Payer: Self-pay | Admitting: Gastroenterology

## 2016-11-18 DIAGNOSIS — I10 Essential (primary) hypertension: Secondary | ICD-10-CM | POA: Diagnosis not present

## 2016-11-18 DIAGNOSIS — I6523 Occlusion and stenosis of bilateral carotid arteries: Secondary | ICD-10-CM | POA: Diagnosis not present

## 2016-11-18 DIAGNOSIS — R001 Bradycardia, unspecified: Secondary | ICD-10-CM | POA: Diagnosis not present

## 2016-11-18 DIAGNOSIS — I2510 Atherosclerotic heart disease of native coronary artery without angina pectoris: Secondary | ICD-10-CM | POA: Diagnosis not present

## 2016-12-23 ENCOUNTER — Ambulatory Visit: Payer: PPO | Admitting: Urology

## 2016-12-23 ENCOUNTER — Encounter: Payer: Self-pay | Admitting: Urology

## 2016-12-23 ENCOUNTER — Ambulatory Visit
Admission: RE | Admit: 2016-12-23 | Discharge: 2016-12-23 | Disposition: A | Discharge status: Home / Self Care | Payer: PPO | Source: Ambulatory Visit | Attending: Urology | Admitting: Urology

## 2016-12-23 VITALS — BP 147/70 | HR 61 | Ht 71.0 in | Wt 168.0 lb

## 2016-12-23 DIAGNOSIS — R3912 Poor urinary stream: Secondary | ICD-10-CM

## 2016-12-23 DIAGNOSIS — Z125 Encounter for screening for malignant neoplasm of prostate: Secondary | ICD-10-CM | POA: Diagnosis not present

## 2016-12-23 DIAGNOSIS — N200 Calculus of kidney: Secondary | ICD-10-CM | POA: Insufficient documentation

## 2016-12-23 DIAGNOSIS — N401 Enlarged prostate with lower urinary tract symptoms: Secondary | ICD-10-CM | POA: Diagnosis not present

## 2016-12-23 NOTE — Progress Notes (Signed)
12/23/2016 9:52 AM   Timothy Singleton [birth year] [id number]  Referring provider: Jerrol Banana., MD [address] [place], [place] [address]  Chief Complaint  Patient presents with  . Nephrolithiasis    22month w/ KUB    HPI: 71 year old male with a history of nephrolithiasis who returns today for his 6 month follow-up.  Most recent cross sectional imaging CT scan 03/11/16  did also show several nonobstructing stones, right greater than left measuring up to 11 mm on the right. This appears to be stable in size since at least 2015 (KUB). KUB today shows a 12 mm right nonobstructing stone.  He does have a personal history of kidney stones.  He is s/p ESWL in ~2012 by Dr. Bernardo Heater.   He does admit that he does not drink enough water but is tring to drink more since last visit.  He's never had a 84-ONGE urine metabolic workup.  Stone Analysis 95% calcium oxalate monohydrate, 2% calcium oxalate dihydrate, 3% calcium carbonate.   He has not had any issues with flank pain or gross hematuria since his last visit.  He did have an interval the replacement. He occasionally does have some back pain when working in the garden and is unsure if this is related to the stone.  He is interested in PSA screening today.  He has not had a rectal exam or PSA in several years.    He does have history of BPH with weak stream which is has improved on Flomax.  He reports today that his stream is good, he is able to empty his bladder, has no urgency or frequency, and gets up 1-2 times at night to void. He is overall pleased with his voiding symptoms. No UTIs or gross hematuria.  PMH: Past Medical History:  Diagnosis Date  . Arthritis   . Cancer ([place])    melanoma / knee  . Depression   . GERD (gastroesophageal reflux disease)   . Hyperlipemia   . Hypertension   . Past heart attack   . Sleep apnea     Surgical History: Past Surgical History:  Procedure Laterality Date  .  BUNIONECTOMY    . COLONOSCOPY WITH PROPOFOL N/A 11/17/2016   Procedure: COLONOSCOPY WITH PROPOFOL;  Surgeon: Jonathon Bellows, MD;  Location: [facility] ENDOSCOPY;  Service: Endoscopy;  Laterality: N/A;  . CORONARY STENT PLACEMENT    . EXTRACORPOREAL SHOCK WAVE LITHOTRIPSY    . HERNIA REPAIR     inguinal-right  . JOINT REPLACEMENT     total shoulder replacement  . KNEE SURGERY Right   . TONSILLECTOMY    . TOTAL SHOULDER REPLACEMENT    . UPPER GI ENDOSCOPY  10/18/01   hiatus hernia  . VASECTOMY    . WRIST SURGERY      Home Medications:  Allergies as of 12/23/2016   No Known Allergies     Medication List       Accurate as of 12/23/16 11:59 PM. Always use your most recent med list.          aspirin 81 MG tablet Take 81 mg by mouth daily.   atorvastatin 80 MG tablet Commonly known as:  LIPITOR Take 1 tablet (80 mg total) by mouth daily.   carbamazepine 200 MG 12 hr capsule Commonly known as:  CARBATROL Take 1 capsule (200 mg total) by mouth 2 (two) times daily.   Iron 325 (65 Fe) MG Tabs Take by mouth.   lisinopril 20 MG tablet Commonly  known as:  PRINIVIL,ZESTRIL   ranitidine 150 MG capsule Commonly known as:  ZANTAC Take 1 capsule (150 mg total) by mouth 2 (two) times daily.   sertraline 100 MG tablet Commonly known as:  ZOLOFT Take 1.5 tablets (150 mg total) by mouth daily.   tamsulosin 0.4 MG Caps capsule Commonly known as:  FLOMAX TAKE 1 CAPSULE (0.4 MG TOTAL) BY MOUTH DAILY.       Allergies: No Known Allergies  Family History: Family History  Problem Relation Age of Onset  . Cancer Mother   . Dementia Mother   . Stroke Father   . Heart disease Father   . Hypertension Father   . Breast cancer Sister   . Parkinson's disease Brother   . Hypertension Brother   . Melanoma Maternal Grandmother   . Bladder Cancer Neg Hx   . Prostate cancer Neg Hx   . Kidney cancer Neg Hx     Social History:  reports that he has quit smoking. He has quit using smokeless  tobacco. He reports that he drinks alcohol. He reports that he does not use drugs.  ROS: UROLOGY Frequent Urination?: No Hard to postpone urination?: No Burning/pain with urination?: No Get up at night to urinate?: Yes Leakage of urine?: No Urine stream starts and stops?: No Trouble starting stream?: No Do you have to strain to urinate?: No Blood in urine?: No Urinary tract infection?: No Sexually transmitted disease?: No Injury to kidneys or bladder?: No Painful intercourse?: No Weak stream?: No Erection problems?: No Penile pain?: No  Gastrointestinal Nausea?: No Vomiting?: No Indigestion/heartburn?: No Diarrhea?: No Constipation?: No  Constitutional Fever: No Night sweats?: No Weight loss?: No Fatigue?: No  Skin Skin rash/lesions?: No Itching?: No  Eyes Blurred vision?: No Double vision?: No  Ears/Nose/Throat Sore throat?: No Sinus problems?: No  Hematologic/Lymphatic Swollen glands?: No Easy bruising?: No  Cardiovascular Leg swelling?: No Chest pain?: No  Respiratory Cough?: No Shortness of breath?: No  Endocrine Excessive thirst?: No  Musculoskeletal Back pain?: No Joint pain?: No  Neurological Headaches?: No Dizziness?: No  Psychologic Depression?: No Anxiety?: No  Physical Exam: BP (!) 147/70   Pulse 61   Ht 5\' 11"  (1.803 m)   Wt 168 lb (76.2 kg)   BMI 23.43 kg/m   Constitutional:  Alert and oriented, No acute distress. HEENT: [place] AT, moist mucus membranes.  Trachea midline, no masses. Cardiovascular: No clubbing, cyanosis, or edema. Respiratory: Normal respiratory effort, no increased work of breathing. GI: Abdomen is soft, nontender, nondistended, no abdominal masses GU: No CVA tenderness. Rectal: Normal sphincter tone. 50 cc prostate, nontender, no nodules. Skin: No rashes, bruises or suspicious lesions. Neurologic: Grossly intact, no focal deficits, moving all 4 extremities. Psychiatric: Normal mood and  affect.  Laboratory Data: Lab Results  Component Value Date   WBC 6.0 11/02/2016   HGB 13.8 03/11/2016   HCT 46.1 11/02/2016   MCV 95 11/02/2016   PLT 242 11/02/2016    Lab Results  Component Value Date   CREATININE 0.96 11/02/2016   KUB: CLINICAL DATA:  Followup right kidney stone.  EXAM: ABDOMEN - 1 VIEW  COMPARISON:  03/18/2016  FINDINGS: 12 mm stone projecting in the mid to lower pole the right kidney is stable. There is a smaller density below this consistent with an intrarenal stone, not evident on the prior study.  Small densities projecting in the lower pole the left kidney consistent with intrarenal stones are stable from the prior exam.  No evidence  of a ureteral stone.  Normal bowel gas pattern.  Skeletal structures showed no acute findings or change. Suture material overlies the right inguinal region consistent with prior right inguinal herniorrhaphy, stable.  IMPRESSION: 1. No acute findings.  No evidence of a ureteral stone. 2. Bilateral intrarenal stones, largest on the right measuring 12 mm. There is a small stone projecting in the lower pole the right kidney which appears new from the prior exam. No other change.   Electronically Signed   By: Lajean Manes M.D.   On: 12/23/2016 11:01   Reviewed today personally  Assessment & Plan:   1. Kidney stones Multiple nonobstructing kidney stones, largest measuring 12 mm right, essentially stable sine 2015.  Asymptomatic.  Given the size of the right-sided stone, I Discussed alternatives today including ESWL versus ureteroscopy vs. Ongoing observation.  He understands that if this stone should pass, he would need surgical intervention. Risk/ benefits of each were discussed.    He would like to continue to follow the stone. He'll return sooner as needed should he develop hematuria or flank pain.  Stone diet reviewed again today  Offered 66-QHUT urine metabolic workup, declined.  2.  Benign prostatic hyperplasia with weak urinary stream Continue flomax, minimal bother - PSA  3. Screening PSA (prostate specific antigen) We discussed the screening guidelines for prostate cancer. Per AUA guidelines, consideration of discontinuation of screening should occur around age 2. Given that he has not had screening in several years, I recommended one last screening and likely defer hereafter. He understands the reasons for this guideline and is agreeable to plan.  Return in about 1 year (around 12/23/2017) for KUB.  Hollice Espy, MD  [facility] [address] [place], [place] [address] [phone]

## 2016-12-23 NOTE — Patient Instructions (Signed)
Dietary Guidelines to Help Prevent Kidney Stones Kidney stones are deposits of minerals and salts that form inside your kidneys. Your risk of developing kidney stones may be greater depending on your diet, your lifestyle, the medicines you take, and whether you have certain medical conditions. Most people can reduce their chances of developing kidney stones by following the instructions below. Depending on your overall health and the type of kidney stones you tend to develop, your dietitian may give you more specific instructions. What are tips for following this plan? Reading food labels   Choose foods with "no salt added" or "low-salt" labels. Limit your sodium intake to less than 1500 mg per day.  Choose foods with calcium for each meal and snack. Try to eat about 300 mg of calcium at each meal. Foods that contain 200-500 mg of calcium per serving include:  8 oz (237 ml) of milk, fortified nondairy milk, and fortified fruit juice.  8 oz (237 ml) of kefir, yogurt, and soy yogurt.  4 oz (118 ml) of tofu.  1 oz of cheese.  1 cup (300 g) of dried figs.  1 cup (91 g) of cooked broccoli.  1-3 oz can of sardines or mackerel.  Most people need 1000 to 1500 mg of calcium each day. Talk to your dietitian about how much calcium is recommended for you. Shopping   Buy plenty of fresh fruits and vegetables. Most people do not need to avoid fruits and vegetables, even if they contain nutrients that may contribute to kidney stones.  When shopping for convenience foods, choose:  Whole pieces of fruit.  Premade salads with dressing on the side.  Low-fat fruit and yogurt smoothies.  Avoid buying frozen meals or prepared deli foods.  Look for foods with live cultures, such as yogurt and kefir. Cooking   Do not add salt to food when cooking. Place a salt shaker on the table and allow each person to add his or her own salt to taste.  Use vegetable protein, such as beans, textured vegetable  protein (TVP), or tofu instead of meat in pasta, casseroles, and soups. Meal planning   Eat less salt, if told by your dietitian. To do this:  Avoid eating processed or premade food.  Avoid eating fast food.  Eat less animal protein, including cheese, meat, poultry, or fish, if told by your dietitian. To do this:  Limit the number of times you have meat, poultry, fish, or cheese each week. Eat a diet free of meat at least 2 days a week.  Eat only one serving each day of meat, poultry, fish, or seafood.  When you prepare animal protein, cut pieces into small portion sizes. For most meat and fish, one serving is about the size of one deck of cards.  Eat at least 5 servings of fresh fruits and vegetables each day. To do this:  Keep fruits and vegetables on hand for snacks.  Eat 1 piece of fruit or a handful of berries with breakfast.  Have a salad and fruit at lunch.  Have two kinds of vegetables at dinner.  Limit foods that are high in a substance called oxalate. These include:  Spinach.  Rhubarb.  Beets.  Potato chips and french fries.  Nuts.  If you regularly take a diuretic medicine, make sure to eat at least 1-2 fruits or vegetables high in potassium each day. These include:  Avocado.  Banana.  Orange, prune, carrot, or tomato juice.  Baked potato.  Cabbage.    Beans and split peas. General instructions   Drink enough fluid to keep your urine clear or pale yellow. This is the most important thing you can do.  Talk to your health care provider and dietitian about taking daily supplements. Depending on your health and the cause of your kidney stones, you may be advised:  Not to take supplements with vitamin C.  To take a calcium supplement.  To take a daily probiotic supplement.  To take other supplements such as magnesium, fish oil, or vitamin B6.  Take all medicines and supplements as told by your health care provider.  Limit alcohol intake to no  more than 1 drink a day for nonpregnant women and 2 drinks a day for men. One drink equals 12 oz of beer, 5 oz of wine, or 1 oz of hard liquor.  Lose weight if told by your health care provider. Work with your dietitian to find strategies and an eating plan that works best for you. What foods are not recommended? Limit your intake of the following foods, or as told by your dietitian. Talk to your dietitian about specific foods you should avoid based on the type of kidney stones and your overall health. Grains  Breads. Bagels. Rolls. Baked goods. Salted crackers. Cereal. Pasta. Vegetables  Spinach. Rhubarb. Beets. Canned vegetables. Pickles. Olives. Meats and other protein foods  Nuts. Nut butters. Large portions of meat, poultry, or fish. Salted or cured meats. Deli meats. Hot dogs. Sausages. Dairy  Cheese. Beverages  Regular soft drinks. Regular vegetable juice. Seasonings and other foods  Seasoning blends with salt. Salad dressings. Canned soups. Soy sauce. Ketchup. Barbecue sauce. Canned pasta sauce. Casseroles. Pizza. Lasagna. Frozen meals. Potato chips. French fries. Summary  You can reduce your risk of kidney stones by making changes to your diet.  The most important thing you can do is drink enough fluid. You should drink enough fluid to keep your urine clear or pale yellow.  Ask your health care provider or dietitian how much protein from animal sources you should eat each day, and also how much salt and calcium you should have each day. This information is not intended to replace advice given to you by your health care provider. Make sure you discuss any questions you have with your health care provider. Document Released: 01/30/2011 Document Revised: 09/15/2016 Document Reviewed: 09/15/2016 Elsevier Interactive Patient Education  2017 Elsevier Inc.  

## 2016-12-24 LAB — PSA: PROSTATE SPECIFIC AG, SERUM: 1.6 ng/mL (ref 0.0–4.0)

## 2017-01-04 DIAGNOSIS — H2511 Age-related nuclear cataract, right eye: Secondary | ICD-10-CM | POA: Diagnosis not present

## 2017-01-12 DIAGNOSIS — E291 Testicular hypofunction: Secondary | ICD-10-CM | POA: Diagnosis not present

## 2017-01-15 DIAGNOSIS — E291 Testicular hypofunction: Secondary | ICD-10-CM | POA: Diagnosis not present

## 2017-01-18 DIAGNOSIS — E291 Testicular hypofunction: Secondary | ICD-10-CM | POA: Diagnosis not present

## 2017-01-19 ENCOUNTER — Encounter: Payer: Self-pay | Admitting: Family Medicine

## 2017-01-26 ENCOUNTER — Telehealth: Payer: Self-pay | Admitting: Family Medicine

## 2017-01-26 NOTE — Telephone Encounter (Signed)
Pt stated he sent a My Chart message 01/19/17 about requesting an Rx for Cialis or generic to be sent to [facility] [place]. I have added the pharmacy to pt's preferred pharmacies. Please advise. Thanks TNP

## 2017-01-26 NOTE — Telephone Encounter (Signed)
This is not on his med list

## 2017-01-27 ENCOUNTER — Other Ambulatory Visit: Payer: Self-pay

## 2017-01-27 MED ORDER — TADALAFIL 20 MG PO TABS
20.0000 mg | ORAL_TABLET | Freq: Every day | ORAL | 0 refills | Status: DC | PRN
Start: 2017-01-27 — End: 2017-09-29

## 2017-01-27 MED ORDER — SILDENAFIL CITRATE 20 MG PO TABS: 20.0000 mg | ORAL_TABLET | Freq: Three times a day (TID) | ORAL | 0 refills | Status: DC

## 2017-01-27 NOTE — Telephone Encounter (Signed)
Pt called back about get the Rx for generic Cialis sent to [facility]. Pt stated that he has discussed this medication with Dr. Rosanna Randy at a previous OV and he has decided he would like to try the medication. Pt request a response today if possible. Please advise. Thanks TNP

## 2017-01-27 NOTE — Progress Notes (Unsigned)
l °

## 2017-01-27 NOTE — Telephone Encounter (Signed)
Only generic is  sildenafil --20mg --2-4 tabs daily prn,#50,5rf. Insurance does not cover at all.

## 2017-02-03 ENCOUNTER — Other Ambulatory Visit: Payer: Self-pay | Admitting: Family Medicine

## 2017-02-08 DIAGNOSIS — E291 Testicular hypofunction: Secondary | ICD-10-CM | POA: Diagnosis not present

## 2017-02-13 ENCOUNTER — Other Ambulatory Visit: Payer: Self-pay | Admitting: Family Medicine

## 2017-02-15 ENCOUNTER — Telehealth: Payer: Self-pay | Admitting: Family Medicine

## 2017-02-15 MED ORDER — SILDENAFIL CITRATE 20 MG PO TABS: ORAL_TABLET | ORAL | 11 refills | Status: DC

## 2017-02-15 NOTE — Telephone Encounter (Signed)
Patient would like a refill on sildenafil (REVATIO).  He is currnetly taking sildenafil (REVATIO) 20 MG tablet but would like stronger MG.  He states that he has discussed this with [place].  Patient will be leaving to go sailing tomorrow.  Patient would also like a larger quantity.  Last time he only got 10.  He uses [facility].

## 2017-02-15 NOTE — Telephone Encounter (Signed)
Sildenafil 20 mg--1-4 tabs dailty prn,#50,11rf

## 2017-02-15 NOTE — Telephone Encounter (Signed)
Please review-aa 

## 2017-02-15 NOTE — Telephone Encounter (Signed)
Pt advised on voicemail, RX sent in as below-aa

## 2017-03-01 ENCOUNTER — Other Ambulatory Visit: Payer: Self-pay | Admitting: Family Medicine

## 2017-03-01 MED ORDER — SILDENAFIL CITRATE 20 MG PO TABS: ORAL_TABLET | ORAL | 11 refills | Status: DC

## 2017-03-22 DIAGNOSIS — X32XXXA Exposure to sunlight, initial encounter: Secondary | ICD-10-CM | POA: Diagnosis not present

## 2017-03-22 DIAGNOSIS — D2261 Melanocytic nevi of right upper limb, including shoulder: Secondary | ICD-10-CM | POA: Diagnosis not present

## 2017-03-22 DIAGNOSIS — E291 Testicular hypofunction: Secondary | ICD-10-CM | POA: Diagnosis not present

## 2017-03-22 DIAGNOSIS — D225 Melanocytic nevi of trunk: Secondary | ICD-10-CM | POA: Diagnosis not present

## 2017-03-22 DIAGNOSIS — Z85828 Personal history of other malignant neoplasm of skin: Secondary | ICD-10-CM | POA: Diagnosis not present

## 2017-03-22 DIAGNOSIS — L570 Actinic keratosis: Secondary | ICD-10-CM | POA: Diagnosis not present

## 2017-03-22 DIAGNOSIS — Z85820 Personal history of malignant melanoma of skin: Secondary | ICD-10-CM | POA: Diagnosis not present

## 2017-03-25 DIAGNOSIS — E291 Testicular hypofunction: Secondary | ICD-10-CM | POA: Diagnosis not present

## 2017-04-13 ENCOUNTER — Encounter: Payer: Self-pay | Admitting: Family Medicine

## 2017-04-13 ENCOUNTER — Ambulatory Visit (INDEPENDENT_AMBULATORY_CARE_PROVIDER_SITE_OTHER): Payer: PPO | Admitting: Family Medicine

## 2017-04-13 VITALS — BP 116/60 | HR 68 | Temp 98.0°F | Resp 16 | Wt 162.0 lb

## 2017-04-13 DIAGNOSIS — I10 Essential (primary) hypertension: Secondary | ICD-10-CM | POA: Diagnosis not present

## 2017-04-13 DIAGNOSIS — G500 Trigeminal neuralgia: Secondary | ICD-10-CM

## 2017-04-13 DIAGNOSIS — F329 Major depressive disorder, single episode, unspecified: Secondary | ICD-10-CM | POA: Diagnosis not present

## 2017-04-13 DIAGNOSIS — F32A Depression, unspecified: Secondary | ICD-10-CM

## 2017-04-13 MED ORDER — TRAZODONE HCL 50 MG PO TABS: 50.0000 mg | ORAL_TABLET | ORAL | 1 refills | Status: DC | PRN

## 2017-04-13 MED ORDER — TRAMADOL HCL 50 MG PO TABS: 50.0000 mg | ORAL_TABLET | Freq: Four times a day (QID) | ORAL | 1 refills | Status: DC | PRN

## 2017-04-13 NOTE — Progress Notes (Signed)
Subjective:  HPI   Hypertension, follow-up:  BP Readings from Last 3 Encounters:  04/13/17 116/60  12/23/16 (!) 147/70  11/17/16 127/79    He was last seen for hypertension 6 months ago.  BP at that visit was 147/70. Management since that visit includes none. He reports good compliance with treatment. He is not having side effects.  He is exercising about times a week. He is adherent to low salt diet.   Outside blood pressures are not being checked regularly. He is experiencing none.  Patient denies chest pain, chest pressure/discomfort, claudication, dyspnea, exertional chest pressure/discomfort, fatigue, irregular heart beat, lower extremity edema, near-syncope, orthopnea, palpitations, paroxysmal nocturnal dyspnea and syncope.   Cardiovascular risk factors include dyslipidemia, hypertension and male gender.   Wt Readings from Last 3 Encounters:  04/13/17 162 lb (73.5 kg)  12/23/16 168 lb (76.2 kg)  11/17/16 165 lb (74.8 kg)   ------------------------------------------------------------------------ Pt reports that his trigeminal neuralgia has flared up again but on the right side this time. He has pain in the ear, side of face, and jaw. He reports that the pain is a shooting pain and feels exactly what he remembers his trigeminal neuralgia feeling like on the other side.    Pt reports that other than the facial pain he is feeling well, and feeling well emotionally.   Prior to Admission medications   Medication Sig Start Date End Date Taking? Authorizing Provider  aspirin 81 MG tablet Take 81 mg by mouth daily.    [provider]  atorvastatin (LIPITOR) 80 MG tablet Take 1 tablet (80 mg total) by mouth daily. 01/21/16   Jerrol Banana., MD  carbamazepine (CARBATROL) 200 MG 12 hr capsule Take 1 capsule (200 mg total) by mouth 2 (two) times daily. 10/28/16   Jerrol Banana., MD  Ferrous Sulfate (IRON) 325 (65 Fe) MG TABS Take by mouth.    [provider]  lisinopril (PRINIVIL,ZESTRIL) 20 MG tablet  09/28/16   [provider]  ranitidine (ZANTAC) 150 MG capsule Take 1 capsule (150 mg total) by mouth 2 (two) times daily. 09/30/16   Jerrol Banana., MD  sertraline (ZOLOFT) 100 MG tablet Take 1.5 tablets (150 mg total) by mouth daily. 09/30/16 01/25/18  Jerrol Banana., MD  sertraline (ZOLOFT) 100 MG tablet TAKE 1 TABLET (100 MG TOTAL) BY MOUTH DAILY. 02/15/17   Jerrol Banana., MD  sildenafil (REVATIO) 20 MG tablet 1 to 4 tablets daily as needed 03/01/17   Jerrol Banana., MD  tadalafil (CIALIS) 20 MG tablet Take 1 tablet (20 mg total) by mouth daily as needed for erectile dysfunction. 01/27/17   Jerrol Banana., MD  tamsulosin (FLOMAX) 0.4 MG CAPS capsule TAKE 1 CAPSULE (0.4 MG TOTAL) BY MOUTH DAILY. 07/27/16   Jerrol Banana., MD    Patient Active Problem List   Diagnosis Date Noted  . Arthritis of shoulder region, degenerative 09/10/2015  . Clinical depression 07/22/2015  . Acid reflux 07/22/2015  . IBS (irritable bowel syndrome) 07/22/2015  . AK (actinic keratosis) 07/22/2015  . TIA (transient ischemic attack) 07/22/2015  . OSA (obstructive sleep apnea) 07/22/2015  . Idiopathic trigeminal neuralgia 07/22/2015  . CAD in native artery 07/22/2015  . GERD (gastroesophageal reflux disease) 07/22/2015  . Allergic rhinitis 07/22/2015  . Arteriosclerosis of coronary artery 10/16/2014  . Essential (primary) hypertension 08/21/2014  . Combined fat and carbohydrate induced hyperlipemia 08/21/2014  . Heart attack (  [place]) 08/13/2014  . Benign prostatic hyperplasia without urinary obstruction 01/16/2014  . Renal colic 01/60/1093    Past Medical History:  Diagnosis Date  . Arthritis   . Cancer ([place])    melanoma / knee  . Depression   . GERD (gastroesophageal reflux disease)   . Hyperlipemia   . Hypertension   . Past heart attack   . Sleep apnea     Social History   Social  History  . Marital status: Married    Spouse name: N/A  . Number of children: N/A  . Years of education: N/A   Occupational History  . Not on file.   Social History Main Topics  . Smoking status: Former Research scientist (life sciences)  . Smokeless tobacco: Former Systems developer     Comment: quit 40 years ago   . Alcohol use 0.0 oz/week     Comment: 2-4 times per month  . Drug use: No  . Sexual activity: Not on file   Other Topics Concern  . Not on file   Social History Narrative  . No narrative on file    No Known Allergies  Review of Systems  Constitutional: Negative.   HENT: Negative.        Facial pain.   Eyes: Negative.   Respiratory: Negative.   Cardiovascular: Negative.   Gastrointestinal: Negative.   Genitourinary: Negative.   Musculoskeletal: Negative.   Skin: Negative.   Neurological: Positive for headaches.  Endo/Heme/Allergies: Negative.   Psychiatric/Behavioral: Negative.     Immunization History  Administered Date(s) Administered  . Influenza, High Dose Seasonal PF 07/23/2015, 09/30/2016  . Pneumococcal Conjugate-13 07/11/2014  . Pneumococcal Polysaccharide-23 09/30/2016  . Td 09/20/2003  . Zoster 01/28/2010    Objective:  BP 116/60 (BP Location: Left Arm, Patient Position: Sitting, Cuff Size: Normal)   Pulse 68   Temp 98 F (36.7 C) (Oral)   Resp 16   Wt 162 lb (73.5 kg)   BMI 22.59 kg/m   Physical Exam  Constitutional: He is oriented to person, place, and time and well-developed, well-nourished, and in no distress.  HENT:  Head: Normocephalic and atraumatic.  Right Ear: External ear normal.  Left Ear: External ear normal.  Nose: Nose normal.  Eyes: Conjunctivae and EOM are normal. Pupils are equal, round, and reactive to light.  Neck: Normal range of motion. Neck supple.  Cardiovascular: Normal rate, regular rhythm, normal heart sounds and intact distal pulses.   Pulmonary/Chest: Effort normal and breath sounds normal.  Abdominal: Soft.  Musculoskeletal: Normal  range of motion.  Neurological: He is alert and oriented to person, place, and time. He has normal reflexes. Gait normal. GCS score is 15.  Skin: Skin is warm and dry.  Psychiatric: Mood, memory, affect and judgment normal.    Lab Results  Component Value Date   WBC 6.0 11/02/2016   HGB 15.0 11/02/2016   HCT 46.1 11/02/2016   PLT 242 11/02/2016   GLUCOSE 98 11/02/2016   CHOL 126 11/02/2016   TRIG 88 11/02/2016   HDL 42 11/02/2016   LDLCALC 66 11/02/2016   TSH 1.990 11/02/2016   HGBA1C 5.1 11/02/2016    CMP     Component Value Date/Time   NA 143 11/02/2016 0900   K 4.3 11/02/2016 0900   CL 103 11/02/2016 0900   CO2 27 11/02/2016 0900   GLUCOSE 98 11/02/2016 0900   GLUCOSE 104 (H) 03/11/2016 0929   BUN 19 11/02/2016 0900   CREATININE 0.96 11/02/2016 0900   CREATININE 1.18  12/12/2012 0913   CALCIUM 9.6 11/02/2016 0900   PROT 6.2 11/02/2016 0900   ALBUMIN 4.2 11/02/2016 0900   AST 21 11/02/2016 0900   ALT 17 11/02/2016 0900   ALKPHOS 88 11/02/2016 0900   BILITOT 0.4 11/02/2016 0900   GFRNONAA 79 11/02/2016 0900   GFRNONAA >60 12/12/2012 0913   GFRAA 92 11/02/2016 0900   GFRAA >60 12/12/2012 0913    Assessment and Plan :  1. Essential (primary) hypertension Stable.   2. Depression, unspecified depression type Stable.   3. Idiopathic trigeminal neuralgia Since this is affecting the opposite side of his face. Will refer to Neurology. Continue Carbamazepine. Refilled Tramadol.  - Ambulatory referral to Neurology   HPI, Exam, and A&P Transcribed under the direction and in the presence of Melquan Ernsberger L. Cranford Mon, MD  Electronically Signed: Katina Dung, CMA I have done the exam and reviewed the above chart and it is accurate to the best of my knowledge. Development worker, community has been used in this note in any air is in the dictation or transcription are unintentional.  [facility] 04/13/2017 2:03 PM

## 2017-04-14 ENCOUNTER — Encounter: Payer: Self-pay | Admitting: Family Medicine

## 2017-04-22 DIAGNOSIS — M5481 Occipital neuralgia: Secondary | ICD-10-CM | POA: Diagnosis not present

## 2017-04-22 DIAGNOSIS — G500 Trigeminal neuralgia: Secondary | ICD-10-CM | POA: Diagnosis not present

## 2017-04-23 NOTE — Progress Notes (Signed)
This encounter was created in error - please disregard.

## 2017-04-27 ENCOUNTER — Ambulatory Visit: Payer: Self-pay | Admitting: Family Medicine

## 2017-05-05 ENCOUNTER — Other Ambulatory Visit: Payer: Self-pay | Admitting: Nurse Practitioner

## 2017-05-05 DIAGNOSIS — M5481 Occipital neuralgia: Secondary | ICD-10-CM

## 2017-05-05 DIAGNOSIS — G500 Trigeminal neuralgia: Secondary | ICD-10-CM

## 2017-05-10 DIAGNOSIS — M5481 Occipital neuralgia: Secondary | ICD-10-CM | POA: Diagnosis not present

## 2017-05-10 DIAGNOSIS — G500 Trigeminal neuralgia: Secondary | ICD-10-CM | POA: Diagnosis not present

## 2017-05-13 ENCOUNTER — Ambulatory Visit
Admission: RE | Admit: 2017-05-13 | Discharge: 2017-05-13 | Disposition: A | Discharge status: Home / Self Care | Payer: PPO | Source: Ambulatory Visit | Attending: Nurse Practitioner | Admitting: Nurse Practitioner

## 2017-05-13 ENCOUNTER — Other Ambulatory Visit: Payer: Self-pay | Admitting: Nurse Practitioner

## 2017-05-13 DIAGNOSIS — M47812 Spondylosis without myelopathy or radiculopathy, cervical region: Secondary | ICD-10-CM | POA: Diagnosis not present

## 2017-05-13 DIAGNOSIS — M5481 Occipital neuralgia: Secondary | ICD-10-CM | POA: Diagnosis present

## 2017-05-13 DIAGNOSIS — G500 Trigeminal neuralgia: Secondary | ICD-10-CM

## 2017-05-13 DIAGNOSIS — R51 Headache: Secondary | ICD-10-CM | POA: Diagnosis not present

## 2017-05-13 MED ORDER — GADOBENATE DIMEGLUMINE 529 MG/ML IV SOLN
15.0000 mL | Freq: Once | INTRAVENOUS | Status: AC | PRN
  Administered 2017-05-13: 15 mL via INTRAVENOUS

## 2017-05-20 DIAGNOSIS — M5481 Occipital neuralgia: Secondary | ICD-10-CM | POA: Diagnosis not present

## 2017-05-20 DIAGNOSIS — G500 Trigeminal neuralgia: Secondary | ICD-10-CM | POA: Diagnosis not present

## 2017-05-25 DIAGNOSIS — M25552 Pain in left hip: Secondary | ICD-10-CM | POA: Diagnosis not present

## 2017-05-25 DIAGNOSIS — I10 Essential (primary) hypertension: Secondary | ICD-10-CM | POA: Diagnosis not present

## 2017-05-25 DIAGNOSIS — I2510 Atherosclerotic heart disease of native coronary artery without angina pectoris: Secondary | ICD-10-CM | POA: Diagnosis not present

## 2017-05-25 DIAGNOSIS — M25551 Pain in right hip: Secondary | ICD-10-CM | POA: Diagnosis not present

## 2017-05-25 DIAGNOSIS — I6523 Occlusion and stenosis of bilateral carotid arteries: Secondary | ICD-10-CM | POA: Diagnosis not present

## 2017-06-08 DIAGNOSIS — B9689 Other specified bacterial agents as the cause of diseases classified elsewhere: Secondary | ICD-10-CM | POA: Diagnosis not present

## 2017-06-08 DIAGNOSIS — R05 Cough: Secondary | ICD-10-CM | POA: Diagnosis not present

## 2017-06-08 DIAGNOSIS — J208 Acute bronchitis due to other specified organisms: Secondary | ICD-10-CM | POA: Diagnosis not present

## 2017-06-08 DIAGNOSIS — J3489 Other specified disorders of nose and nasal sinuses: Secondary | ICD-10-CM | POA: Diagnosis not present

## 2017-06-08 DIAGNOSIS — J029 Acute pharyngitis, unspecified: Secondary | ICD-10-CM | POA: Diagnosis not present

## 2017-06-18 DIAGNOSIS — E291 Testicular hypofunction: Secondary | ICD-10-CM | POA: Diagnosis not present

## 2017-06-24 DIAGNOSIS — G479 Sleep disorder, unspecified: Secondary | ICD-10-CM | POA: Diagnosis not present

## 2017-06-24 DIAGNOSIS — R5383 Other fatigue: Secondary | ICD-10-CM | POA: Diagnosis not present

## 2017-06-24 DIAGNOSIS — E291 Testicular hypofunction: Secondary | ICD-10-CM | POA: Diagnosis not present

## 2017-06-24 DIAGNOSIS — N529 Male erectile dysfunction, unspecified: Secondary | ICD-10-CM | POA: Diagnosis not present

## 2017-07-07 ENCOUNTER — Other Ambulatory Visit: Payer: Self-pay

## 2017-07-07 DIAGNOSIS — E782 Mixed hyperlipidemia: Secondary | ICD-10-CM

## 2017-07-07 DIAGNOSIS — E7849 Other hyperlipidemia: Secondary | ICD-10-CM

## 2017-07-20 DIAGNOSIS — E7849 Other hyperlipidemia: Secondary | ICD-10-CM | POA: Diagnosis not present

## 2017-07-21 ENCOUNTER — Telehealth: Payer: Self-pay

## 2017-07-21 LAB — LIPID PANEL
CHOLESTEROL TOTAL: 174 mg/dL (ref 100–199)
Chol/HDL Ratio: 4.8 ratio (ref 0.0–5.0)
HDL: 36 mg/dL — AB (ref >39)
LDL CALC: 118 mg/dL — AB (ref 0–99)
Triglycerides: 98 mg/dL (ref 0–149)
VLDL Cholesterol Cal: 20 mg/dL (ref 5–40)

## 2017-07-21 MED ORDER — EZETIMIBE 10 MG PO TABS: 10.0000 mg | ORAL_TABLET | Freq: Every day | ORAL | 3 refills | Status: DC

## 2017-07-21 NOTE — Telephone Encounter (Signed)
[facility]'s wife advised. They will call back in 1 month to request a lab slip to have labs rechecked.

## 2017-07-21 NOTE — Telephone Encounter (Signed)
-----   Message from Jerrol Banana., MD sent at 07/21/2017 10:03 AM EDT ----- Zetia 10 mg every morning. Lipids 56month

## 2017-08-18 ENCOUNTER — Other Ambulatory Visit: Payer: Self-pay | Admitting: Family Medicine

## 2017-09-20 DIAGNOSIS — Z85828 Personal history of other malignant neoplasm of skin: Secondary | ICD-10-CM | POA: Diagnosis not present

## 2017-09-20 DIAGNOSIS — D225 Melanocytic nevi of trunk: Secondary | ICD-10-CM | POA: Diagnosis not present

## 2017-09-20 DIAGNOSIS — X32XXXA Exposure to sunlight, initial encounter: Secondary | ICD-10-CM | POA: Diagnosis not present

## 2017-09-20 DIAGNOSIS — L820 Inflamed seborrheic keratosis: Secondary | ICD-10-CM | POA: Diagnosis not present

## 2017-09-20 DIAGNOSIS — D485 Neoplasm of uncertain behavior of skin: Secondary | ICD-10-CM | POA: Diagnosis not present

## 2017-09-20 DIAGNOSIS — Z85820 Personal history of malignant melanoma of skin: Secondary | ICD-10-CM | POA: Diagnosis not present

## 2017-09-20 DIAGNOSIS — L281 Prurigo nodularis: Secondary | ICD-10-CM | POA: Diagnosis not present

## 2017-09-20 DIAGNOSIS — L570 Actinic keratosis: Secondary | ICD-10-CM | POA: Diagnosis not present

## 2017-09-20 DIAGNOSIS — D2262 Melanocytic nevi of left upper limb, including shoulder: Secondary | ICD-10-CM | POA: Diagnosis not present

## 2017-09-21 ENCOUNTER — Ambulatory Visit: Payer: Self-pay | Admitting: Physical Therapy

## 2017-09-21 DIAGNOSIS — Z139 Encounter for screening, unspecified: Secondary | ICD-10-CM | POA: Insufficient documentation

## 2017-09-29 ENCOUNTER — Encounter: Payer: Self-pay | Admitting: Family Medicine

## 2017-09-29 ENCOUNTER — Ambulatory Visit
Admission: RE | Admit: 2017-09-29 | Discharge: 2017-09-29 | Disposition: A | Discharge status: Home / Self Care | Payer: PPO | Source: Ambulatory Visit | Attending: Family Medicine | Admitting: Family Medicine

## 2017-09-29 ENCOUNTER — Other Ambulatory Visit: Payer: Self-pay

## 2017-09-29 ENCOUNTER — Ambulatory Visit (INDEPENDENT_AMBULATORY_CARE_PROVIDER_SITE_OTHER): Payer: PPO | Admitting: Family Medicine

## 2017-09-29 VITALS — BP 118/56 | HR 80 | Temp 98.5°F | Resp 16 | Wt 171.0 lb

## 2017-09-29 DIAGNOSIS — I2510 Atherosclerotic heart disease of native coronary artery without angina pectoris: Secondary | ICD-10-CM | POA: Diagnosis not present

## 2017-09-29 DIAGNOSIS — E782 Mixed hyperlipidemia: Secondary | ICD-10-CM | POA: Diagnosis not present

## 2017-09-29 DIAGNOSIS — Z1159 Encounter for screening for other viral diseases: Secondary | ICD-10-CM | POA: Diagnosis not present

## 2017-09-29 DIAGNOSIS — M542 Cervicalgia: Secondary | ICD-10-CM | POA: Insufficient documentation

## 2017-09-29 DIAGNOSIS — M25512 Pain in left shoulder: Secondary | ICD-10-CM | POA: Insufficient documentation

## 2017-09-29 DIAGNOSIS — Z23 Encounter for immunization: Secondary | ICD-10-CM | POA: Diagnosis not present

## 2017-09-29 DIAGNOSIS — Z0000 Encounter for general adult medical examination without abnormal findings: Secondary | ICD-10-CM | POA: Diagnosis not present

## 2017-09-29 DIAGNOSIS — Z96612 Presence of left artificial shoulder joint: Secondary | ICD-10-CM | POA: Diagnosis not present

## 2017-09-29 MED ORDER — CYCLOBENZAPRINE HCL 10 MG PO TABS: 10.0000 mg | ORAL_TABLET | Freq: Three times a day (TID) | ORAL | 0 refills | Status: DC | PRN

## 2017-09-29 NOTE — Progress Notes (Signed)
Patient: Timothy Singleton, Male    DOB: 11/28/1945, 71 y.o.   MRN: [id number] Visit Date: 09/29/2017  Today's Provider: Wilhemena Durie, MD   Chief Complaint  Patient presents with  . Annual Exam   Subjective:  Timothy Singleton is a 71 y.o. male who presents today for health maintenance and complete physical. He feels fairly well. He reports exercising not at this time. He reports he is sleeping well. Patient had Wellness Visit with [facility] in January 2018. Immunization History  Administered Date(s) Administered  . Influenza, High Dose Seasonal PF 07/23/2015, 09/30/2016  . Pneumococcal Conjugate-13 07/11/2014  . Pneumococcal Polysaccharide-23 09/30/2016  . Td 09/20/2003  . Zoster 01/28/2010   Last colonoscopy was 11/17/16 diverticulosis, internal hemorrhoids, otherwise normal, suggestion to repeat in 5 years. 6CIT Screen 11/13/2016  What Year? 0 points  What month? 0 points  What time? 0 points  Count back from 20 0 points  Months in reverse 0 points  Repeat phrase 0 points  Total Score 0    Fall Risk  09/29/2017 11/13/2016 07/23/2015  Falls in the past year? No No No    Review of Systems  Constitutional: Negative.   HENT: Positive for sore throat.   Eyes: Negative.   Respiratory: Negative.   Cardiovascular: Negative.   Gastrointestinal: Negative.   Endocrine: Negative.   Genitourinary: Negative.   Musculoskeletal: Positive for neck pain and neck stiffness.  Skin: Negative.   Allergic/Immunologic: Negative.   Neurological: Negative.   Hematological: Bruises/bleeds easily.  Psychiatric/Behavioral: Negative.     Social History   Socioeconomic History  . Marital status: Married    Spouse name: Not on file  . Number of children: Not on file  . Years of education: Not on file  . Highest education level: Not on file  Social Needs  . Financial resource strain: Not on file  . Food insecurity - worry: Not on file  . Food insecurity - inability: Not on file   . Transportation needs - medical: Not on file  . Transportation needs - non-medical: Not on file  Occupational History  . Not on file  Tobacco Use  . Smoking status: Former Research scientist (life sciences)  . Smokeless tobacco: Former Systems developer  . Tobacco comment: quit 40 years ago   Substance and Sexual Activity  . Alcohol use: Yes    Alcohol/week: 0.0 oz    Comment: 2-4 times per month  . Drug use: No  . Sexual activity: Not on file  Other Topics Concern  . Not on file  Social History Narrative  . Not on file    Patient Active Problem List   Diagnosis Date Noted  . Arthritis of shoulder region, degenerative 09/10/2015  . Clinical depression 07/22/2015  . Acid reflux 07/22/2015  . IBS (irritable bowel syndrome) 07/22/2015  . AK (actinic keratosis) 07/22/2015  . TIA (transient ischemic attack) 07/22/2015  . OSA (obstructive sleep apnea) 07/22/2015  . Idiopathic trigeminal neuralgia 07/22/2015  . CAD in native artery 07/22/2015  . GERD (gastroesophageal reflux disease) 07/22/2015  . Allergic rhinitis 07/22/2015  . Arteriosclerosis of coronary artery 10/16/2014  . Essential (primary) hypertension 08/21/2014  . Combined fat and carbohydrate induced hyperlipemia 08/21/2014  . Heart attack ([place]) 08/13/2014  . Benign prostatic hyperplasia without urinary obstruction 01/16/2014  . Renal colic 73/71/0626    Past Surgical History:  Procedure Laterality Date  . BUNIONECTOMY    . COLONOSCOPY WITH PROPOFOL N/A 11/17/2016   Procedure: COLONOSCOPY WITH PROPOFOL;  Surgeon: Jonathon Bellows, MD;  Location: [facility] ENDOSCOPY;  Service: Endoscopy;  Laterality: N/A;  . CORONARY STENT PLACEMENT    . EXTRACORPOREAL SHOCK WAVE LITHOTRIPSY    . HERNIA REPAIR     inguinal-right  . JOINT REPLACEMENT     total shoulder replacement  . KNEE SURGERY Right   . TONSILLECTOMY    . TOTAL SHOULDER REPLACEMENT    . UPPER GI ENDOSCOPY  10/18/01   hiatus hernia  . VASECTOMY    . WRIST SURGERY      His family history includes  Breast cancer in his sister; Cancer in his mother; Dementia in his mother; Heart disease in his father; Hypertension in his brother and father; Melanoma in his maternal grandmother; Parkinson's disease in his brother; Stroke in his father.     Outpatient Encounter Medications as of 09/29/2017  Medication Sig Note  . aspirin 81 MG tablet Take 81 mg by mouth daily.   . Cimetidine (ACID RELIEF PO) Take by mouth daily.   Marland Kitchen ezetimibe (ZETIA) 10 MG tablet Take 1 tablet (10 mg total) by mouth daily.   Marland Kitchen lisinopril (PRINIVIL,ZESTRIL) 20 MG tablet  09/30/2016: Received from: External Pharmacy  . sertraline (ZOLOFT) 100 MG tablet TAKE 1 TABLET (100 MG TOTAL) BY MOUTH DAILY.   . sildenafil (REVATIO) 20 MG tablet 1 to 4 tablets daily as needed   . tamsulosin (FLOMAX) 0.4 MG CAPS capsule TAKE 1 CAPSULE (0.4 MG TOTAL) BY MOUTH DAILY.   . [DISCONTINUED] RaNITidine HCl (ACID CONTROL PO) Take by mouth daily. OTC   . carbamazepine (CARBATROL) 200 MG 12 hr capsule Take 1 capsule (200 mg total) by mouth 2 (two) times daily. (Patient not taking: Reported on 09/29/2017)   . [DISCONTINUED] atorvastatin (LIPITOR) 80 MG tablet Take 1 tablet (80 mg total) by mouth daily.   . [DISCONTINUED] Ferrous Sulfate (IRON) 325 (65 Fe) MG TABS Take by mouth.   . [DISCONTINUED] ranitidine (ZANTAC) 150 MG capsule Take 1 capsule (150 mg total) by mouth 2 (two) times daily.   . [DISCONTINUED] tadalafil (CIALIS) 20 MG tablet Take 1 tablet (20 mg total) by mouth daily as needed for erectile dysfunction. (Patient not taking: Reported on 04/13/2017)   . [DISCONTINUED] traMADol (ULTRAM) 50 MG tablet Take 1 tablet (50 mg total) by mouth every 6 (six) hours as needed.    No facility-administered encounter medications on file as of 09/29/2017.     Patient Care Team: Jerrol Banana., MD as PCP - General (Family Medicine) Dingeldein, Remo Lipps, MD as Consulting Physician (Ophthalmology) Corey Skains, MD as Consulting Physician  (Cardiology) Dasher, Rayvon Char, MD as Consulting Physician (Dermatology) Hollice Espy, MD as Consulting Physician (Urology)      Objective:   Vitals:  Vitals:   09/29/17 1053  BP: (!) 118/56  Pulse: 80  Resp: 16  Temp: 98.5 F (36.9 C)  Weight: 171 lb (77.6 kg)    Physical Exam  Constitutional: He is oriented to person, place, and time. He appears well-developed and well-nourished.  HENT:  Head: Normocephalic and atraumatic.  Right Ear: External ear normal.  Left Ear: External ear normal.  Nose: Nose normal.  Mouth/Throat: Oropharynx is clear and moist.  Eyes: Conjunctivae are normal. Pupils are equal, round, and reactive to light. No scleral icterus.  Neck: Neck supple. No thyromegaly present.  Cardiovascular: Normal rate, regular rhythm and normal heart sounds.  Pulmonary/Chest: Effort normal and breath sounds normal.  Abdominal: Soft.  Genitourinary:  Genitourinary Comments: Defer DRE  Musculoskeletal: Normal range of motion.  Lymphadenopathy:    He has no cervical adenopathy.  Neurological: He is alert and oriented to person, place, and time. He has normal reflexes.  Skin: Skin is warm and dry.  Psychiatric: He has a normal mood and affect. His behavior is normal. Judgment and thought content normal.     Depression Screen PHQ 2/9 Scores 09/29/2017 11/13/2016 09/30/2016 07/23/2015  PHQ - 2 Score 0 0 0 0  PHQ- 9 Score 1 - 5 -   Assessment & Plan:  1. Annual physical exam Defer DRE. Done by Dr Rayburn Ma - CBC with Differential/Platelet - COMPLETE METABOLIC PANEL WITH GFR - TSH - Lipid Profile  2. Acute pain of left shoulder New. Get xray, try Flexeril. - DG Cervical Spine Complete; Future - DG Shoulder Left; Future - cyclobenzaprine (FLEXERIL) 10 MG tablet; Take 1 tablet (10 mg total) by mouth 3 (three) times daily as needed for muscle spasms.  Dispense: 30 tablet; Refill: 0  3. Neck pain Get xray, try Flexeril. - DG Cervical Spine Complete; Future - DG  Shoulder Left; Future - cyclobenzaprine (FLEXERIL) 10 MG tablet; Take 1 tablet (10 mg total) by mouth 3 (three) times daily as needed for muscle spasms.  Dispense: 30 tablet; Refill: 0  4. CAD in native artery - COMPLETE METABOLIC PANEL WITH GFR - Lipid Profile  5. Combined fat and carbohydrate induced hyperlipemia - COMPLETE METABOLIC PANEL WITH GFR - Lipid Profile  6. Need for hepatitis C screening test  Hepatitis C Antibody 7. Need for immunization against influenza - Flu vaccine HIGH DOSE PF (Fluzone High dose)  HPI, Exam and A&P transcribed by Tiffany Kocher, RMA under direction and in the presence of Miguel Aschoff, MD.

## 2017-10-04 DIAGNOSIS — E782 Mixed hyperlipidemia: Secondary | ICD-10-CM | POA: Diagnosis not present

## 2017-10-04 DIAGNOSIS — I2510 Atherosclerotic heart disease of native coronary artery without angina pectoris: Secondary | ICD-10-CM | POA: Diagnosis not present

## 2017-10-04 DIAGNOSIS — Z1159 Encounter for screening for other viral diseases: Secondary | ICD-10-CM | POA: Diagnosis not present

## 2017-10-04 DIAGNOSIS — Z0000 Encounter for general adult medical examination without abnormal findings: Secondary | ICD-10-CM | POA: Diagnosis not present

## 2017-10-05 ENCOUNTER — Telehealth: Payer: Self-pay

## 2017-10-05 LAB — CBC WITH DIFFERENTIAL/PLATELET
BASOS ABS: 30 {cells}/uL (ref 0–200)
Basophils Relative: 0.5 %
Eosinophils Absolute: 366 cells/uL (ref 15–500)
Eosinophils Relative: 6.2 %
HEMATOCRIT: 48.1 % (ref 38.5–50.0)
Hemoglobin: 16.1 g/dL (ref 13.2–17.1)
LYMPHS ABS: 1569 {cells}/uL (ref 850–3900)
MCH: 29.4 pg (ref 27.0–33.0)
MCHC: 33.5 g/dL (ref 32.0–36.0)
MCV: 87.9 fL (ref 80.0–100.0)
MPV: 10.4 fL (ref 7.5–12.5)
Monocytes Relative: 8.9 %
NEUTROS PCT: 57.8 %
Neutro Abs: 3410 cells/uL (ref 1500–7800)
Platelets: 224 10*3/uL (ref 140–400)
RBC: 5.47 10*6/uL (ref 4.20–5.80)
RDW: 12.6 % (ref 11.0–15.0)
Total Lymphocyte: 26.6 %
WBC: 5.9 10*3/uL (ref 3.8–10.8)
WBCMIX: 525 {cells}/uL (ref 200–950)

## 2017-10-05 LAB — HEPATITIS C ANTIBODY
HEP C AB: NONREACTIVE
SIGNAL TO CUT-OFF: 0.01 (ref <1.00)

## 2017-10-05 LAB — COMPLETE METABOLIC PANEL WITH GFR
AG Ratio: 2 (calc) (ref 1.0–2.5)
ALBUMIN MSPROF: 4.3 g/dL (ref 3.6–5.1)
ALKALINE PHOSPHATASE (APISO): 70 U/L (ref 40–115)
ALT: 15 U/L (ref 9–46)
AST: 19 U/L (ref 10–35)
BUN: 21 mg/dL (ref 7–25)
CALCIUM: 9.9 mg/dL (ref 8.6–10.3)
CO2: 33 mmol/L — ABNORMAL HIGH (ref 20–32)
Chloride: 102 mmol/L (ref 98–110)
Creat: 1.18 mg/dL (ref 0.70–1.18)
GFR, EST NON AFRICAN AMERICAN: 61 mL/min/{1.73_m2} (ref >=60)
GFR, Est African American: 71 mL/min/{1.73_m2} (ref >=60)
Globulin: 2.1 g/dL (calc) (ref 1.9–3.7)
Glucose, Bld: 99 mg/dL (ref 65–99)
POTASSIUM: 4.3 mmol/L (ref 3.5–5.3)
Sodium: 141 mmol/L (ref 135–146)
Total Bilirubin: 0.5 mg/dL (ref 0.2–1.2)
Total Protein: 6.4 g/dL (ref 6.1–8.1)

## 2017-10-05 LAB — LIPID PANEL
CHOL/HDL RATIO: 4.8 (calc) (ref <5.0)
CHOLESTEROL: 157 mg/dL (ref <200)
HDL: 33 mg/dL — ABNORMAL LOW (ref >40)
LDL Cholesterol (Calc): 101 mg/dL (calc) — ABNORMAL HIGH
Non-HDL Cholesterol (Calc): 124 mg/dL (calc) (ref <130)
Triglycerides: 131 mg/dL (ref <150)

## 2017-10-05 LAB — TSH: TSH: 2.22 mIU/L (ref 0.40–4.50)

## 2017-10-05 NOTE — Telephone Encounter (Signed)
-----   Message from Jerrol Banana., MD sent at 10/05/2017  9:21 AM EST ----- Labs OK.

## 2017-10-05 NOTE — Telephone Encounter (Signed)
Patient advised as below.  

## 2017-10-22 DIAGNOSIS — R5383 Other fatigue: Secondary | ICD-10-CM | POA: Diagnosis not present

## 2017-10-22 DIAGNOSIS — N529 Male erectile dysfunction, unspecified: Secondary | ICD-10-CM | POA: Diagnosis not present

## 2017-10-22 DIAGNOSIS — G479 Sleep disorder, unspecified: Secondary | ICD-10-CM | POA: Diagnosis not present

## 2017-10-22 DIAGNOSIS — E291 Testicular hypofunction: Secondary | ICD-10-CM | POA: Diagnosis not present

## 2017-10-27 DIAGNOSIS — M2550 Pain in unspecified joint: Secondary | ICD-10-CM | POA: Diagnosis not present

## 2017-10-27 DIAGNOSIS — G479 Sleep disorder, unspecified: Secondary | ICD-10-CM | POA: Diagnosis not present

## 2017-10-27 DIAGNOSIS — N529 Male erectile dysfunction, unspecified: Secondary | ICD-10-CM | POA: Diagnosis not present

## 2017-10-27 DIAGNOSIS — R5383 Other fatigue: Secondary | ICD-10-CM | POA: Diagnosis not present

## 2017-10-27 DIAGNOSIS — E291 Testicular hypofunction: Secondary | ICD-10-CM | POA: Diagnosis not present

## 2017-11-08 ENCOUNTER — Other Ambulatory Visit: Payer: Self-pay | Admitting: Family Medicine

## 2017-11-08 NOTE — Telephone Encounter (Signed)
[facility] faxed a refill request for a 90-days supply for the following medication. Thanks CC ° °sertraline (ZOLOFT) 100 MG tablet  ° °

## 2017-11-09 MED ORDER — SERTRALINE HCL 100 MG PO TABS: 100.0000 mg | ORAL_TABLET | Freq: Every day | ORAL | 3 refills | Status: DC

## 2017-11-12 ENCOUNTER — Telehealth: Payer: Self-pay | Admitting: Family Medicine

## 2017-11-29 DIAGNOSIS — I10 Essential (primary) hypertension: Secondary | ICD-10-CM | POA: Diagnosis not present

## 2017-11-29 DIAGNOSIS — I6523 Occlusion and stenosis of bilateral carotid arteries: Secondary | ICD-10-CM | POA: Diagnosis not present

## 2017-11-29 DIAGNOSIS — E782 Mixed hyperlipidemia: Secondary | ICD-10-CM | POA: Diagnosis not present

## 2017-11-29 DIAGNOSIS — I2510 Atherosclerotic heart disease of native coronary artery without angina pectoris: Secondary | ICD-10-CM | POA: Diagnosis not present

## 2017-12-24 ENCOUNTER — Encounter: Payer: Self-pay | Admitting: Urology

## 2017-12-24 ENCOUNTER — Other Ambulatory Visit: Payer: Self-pay | Admitting: Radiology

## 2017-12-24 ENCOUNTER — Ambulatory Visit
Admission: RE | Admit: 2017-12-24 | Discharge: 2017-12-24 | Disposition: A | Discharge status: Home / Self Care | Payer: PPO | Source: Ambulatory Visit | Attending: Urology | Admitting: Urology

## 2017-12-24 ENCOUNTER — Other Ambulatory Visit: Payer: Self-pay | Admitting: Urology

## 2017-12-24 ENCOUNTER — Ambulatory Visit: Payer: PPO | Admitting: Urology

## 2017-12-24 VITALS — BP 114/69 | HR 65 | Resp 16 | Ht 71.0 in | Wt 167.0 lb

## 2017-12-24 DIAGNOSIS — N200 Calculus of kidney: Secondary | ICD-10-CM

## 2017-12-24 DIAGNOSIS — E291 Testicular hypofunction: Secondary | ICD-10-CM | POA: Diagnosis not present

## 2017-12-24 DIAGNOSIS — N401 Enlarged prostate with lower urinary tract symptoms: Secondary | ICD-10-CM

## 2017-12-24 DIAGNOSIS — R3912 Poor urinary stream: Secondary | ICD-10-CM

## 2017-12-24 NOTE — Progress Notes (Signed)
12/24/2017 2:09 PM   Timothy Singleton 01-07-1946 [id number]  Referring provider: Jerrol Banana., MD [address] [place], [place] [address]  Chief Complaint  Patient presents with  . Nephrolithiasis    HPI: 72 year old male with a history of nephrolithiasis who returns today for annual follow-up.  He does have a known asymptomatic right-sided stone, previously measuring 12 mm.  It appears to have increased slightly in size today up to 15 mm.  He continues to be asymptomatic.  No flank pain or gross hematuria.  He does also have a relatively small nonobstructing stone on the left as well.  He does have a personal history of kidney stones.  He is s/p ESWL in ~2012 by Dr. Bernardo Heater.   He does admit that he does not drink enough water but is tring to drink more since last visit.  He's never had a 09-QZRA urine metabolic workup.   Stone Analysis 95% calcium oxalate monohydrate, 2% calcium oxalate dihydrate, 3% calcium carbonate.  He does have history of BPH with weak stream which is has improved on Flomax.  He reports today that his stream is good, he is able to empty his bladder, has no urgency or frequency, and gets up 1-2 times at night to void. He is overall pleased with his voiding symptoms. No UTIs or gross hematuria.  This is essentially unchanged from his previous visit.   Last PSA 1.8 on 12/23/16, further PSA screening deferred.   He does mention today that he has been seeking care at a private clinic called [facility] in [place] for hormonal therapy.  He and his wife had been abstinent from sexual activity for many years and his wife and he have elected to seek treatment for this.  They are both being treated with hormonal therapy.  He is receiving testosterone implants.  He is unsure what his testosterone levels were prior to initiation of therapy or if they are monitoring his PSA/hematocrit, lipids, amongst others.  He is also using some sort of milk tab for  erectile dysfunction which seems to help and maintain his erections longer.  PMH: Past Medical History:  Diagnosis Date  . Arthritis   . Cancer ([place])    melanoma / knee  . Depression   . GERD (gastroesophageal reflux disease)   . Hyperlipemia   . Hypertension   . Past heart attack   . Sleep apnea     Surgical History: Past Surgical History:  Procedure Laterality Date  . BUNIONECTOMY    . COLONOSCOPY WITH PROPOFOL N/A 11/17/2016   Procedure: COLONOSCOPY WITH PROPOFOL;  Surgeon: Jonathon Bellows, MD;  Location: [facility] ENDOSCOPY;  Service: Endoscopy;  Laterality: N/A;  . CORONARY STENT PLACEMENT    . EXTRACORPOREAL SHOCK WAVE LITHOTRIPSY    . HERNIA REPAIR     inguinal-right  . JOINT REPLACEMENT     total shoulder replacement  . KNEE SURGERY Right   . TONSILLECTOMY    . TOTAL SHOULDER REPLACEMENT    . UPPER GI ENDOSCOPY  10/18/01   hiatus hernia  . VASECTOMY    . WRIST SURGERY      Home Medications:  Allergies as of 12/24/2017   No Known Allergies     Medication List        Accurate as of 12/24/17 11:59 PM. Always use your most recent med list.          ACID RELIEF PO Take by mouth daily.   aspirin 81 MG tablet  Take 81 mg by mouth daily.   carbamazepine 200 MG 12 hr capsule Commonly known as:  CARBATROL Take 1 capsule (200 mg total) by mouth 2 (two) times daily.   cyclobenzaprine 10 MG tablet Commonly known as:  FLEXERIL Take 1 tablet (10 mg total) by mouth 3 (three) times daily as needed for muscle spasms.   ezetimibe 10 MG tablet Commonly known as:  ZETIA Take 1 tablet (10 mg total) by mouth daily.   lisinopril 20 MG tablet Commonly known as:  PRINIVIL,ZESTRIL   sertraline 100 MG tablet Commonly known as:  ZOLOFT Take 1 tablet (100 mg total) by mouth daily.   sildenafil 20 MG tablet Commonly known as:  REVATIO 1 to 4 tablets daily as needed   tamsulosin 0.4 MG Caps capsule Commonly known as:  FLOMAX TAKE 1 CAPSULE (0.4 MG TOTAL) BY MOUTH DAILY.         Allergies: No Known Allergies  Family History: Family History  Problem Relation Age of Onset  . Cancer Mother   . Dementia Mother   . Stroke Father   . Heart disease Father   . Hypertension Father   . Breast cancer Sister   . Parkinson's disease Brother   . Hypertension Brother   . Melanoma Maternal Grandmother   . Bladder Cancer Neg Hx   . Prostate cancer Neg Hx   . Kidney cancer Neg Hx     Social History:  reports that he has quit smoking. He has quit using smokeless tobacco. He reports that he drinks alcohol. He reports that he does not use drugs.  ROS: UROLOGY Frequent Urination?: Yes Hard to postpone urination?: Yes Burning/pain with urination?: No Get up at night to urinate?: Yes Leakage of urine?: No Urine stream starts and stops?: No Trouble starting stream?: No Do you have to strain to urinate?: No Blood in urine?: No Urinary tract infection?: No Sexually transmitted disease?: No Injury to kidneys or bladder?: No Painful intercourse?: No Weak stream?: No Erection problems?: No Penile pain?: No  Gastrointestinal Nausea?: No Vomiting?: No Indigestion/heartburn?: No Diarrhea?: No Constipation?: No  Constitutional Fever: No Night sweats?: No Weight loss?: No Fatigue?: No  Skin Skin rash/lesions?: No Itching?: No  Eyes Blurred vision?: No Double vision?: No  Ears/Nose/Throat Sore throat?: No Sinus problems?: No  Hematologic/Lymphatic Swollen glands?: No Easy bruising?: No  Cardiovascular Leg swelling?: No Chest pain?: No  Respiratory Cough?: No Shortness of breath?: No  Endocrine Excessive thirst?: No  Musculoskeletal Back pain?: No Joint pain?: No  Neurological Headaches?: No Dizziness?: No  Psychologic Depression?: No Anxiety?: No  Physical Exam: BP 114/69   Pulse 65   Resp 16   Ht 5\' 11"  (1.803 m)   Wt 167 lb (75.8 kg)   SpO2 98%   BMI 23.29 kg/m   Constitutional:  Alert and oriented, No acute  distress. HEENT: [place] AT, moist mucus membranes.  Trachea midline, no masses. Cardiovascular: No clubbing, cyanosis, or edema. Respiratory: Normal respiratory effort, no increased work of breathing. GI: Abdomen is soft, nontender, nondistended, no abdominal masses GU: No CVA tenderness. Skin: No rashes, bruises or suspicious lesions. Neurologic: Grossly intact, no focal deficits, moving all 4 extremities. Psychiatric: Normal mood and affect.  Laboratory Data: Lab Results  Component Value Date   WBC 5.9 10/04/2017   HGB 16.1 10/04/2017   HCT 48.1 10/04/2017   MCV 87.9 10/04/2017   PLT 224 10/04/2017    Lab Results  Component Value Date   CREATININE 1.18 10/04/2017  KUB: CLINICAL DATA:  Pt states nephrolithiasis for several years; pt states f/u and denies any current symptoms  EXAM: ABDOMEN - 1 VIEW  COMPARISON:  Abdomen plain film dated 12/23/2016  FINDINGS: Stable appearance of the right renal stones, largest again measuring 12 mm. Faint left-sided renal stones also appear stable, largest measuring approximately 4 mm. No evidence of ureteral stone identified.  Bowel gas pattern is nonobstructive. No evidence of soft tissue mass or abnormal fluid collection. No evidence of free intraperitoneal air. No acute or suspicious osseous finding.  IMPRESSION: 1. No acute findings.  No evidence of ureteral stone. 2. Bilateral nephrolithiasis, stable appearance.   Electronically Signed   By: Franki Cabot M.D.   On: 12/24/2017 11:38   Reviewed today personally.  On my interpretation, feel that the right-sided stone appears to be enlarging, measures up to 15 mm in length on my measurement.  Assessment & Plan:   1. Kidney stones Multiple nonobstructing kidney stones, largest measuring 15 mm right  Given the size of the right-sided stone which appears to be enlarging slightly from a year ago, I Discussed alternatives today including ESWL versus ureteroscopy vs.  Ongoing observation.  He understands that if this stone should pass, he would need surgical intervention. Risk/ benefits of each were discussed.    He is most interested in shockwave lithotripsy which she is done well with in the past.  Given the size of the stone, I specifically discussed the possibility of development of [place] or need for staged procedure.  He was offered placement of a preoperative stent as a precaution.  He would prefer to proceed with shockwave lithotripsy and if an urgent or emergent stent is needed, then pursue that as needed.  He understands all additional risks including failure of the procedure, damage to surrounding structures, hematuria, hematoma, arrhythmia, infection need for multiple procedures as above..  Plan for right shockwave lithotripsy.   2. Benign prostatic hyperplasia with weak urinary stream Continue flomax, minimal bother - PSA  3. Hypogonadism in male Presumably diagnosed with hypogonadism by private cash pay clinic Unclear whether this is supervised by a physician or if he is received the appropriate diagnostic evaluation and monitoring I expressed my concerns as well as discussed the risks and benefits of treatment of hypogonadism He will ask for his lab results and bring them to his next appointment for me to review  Schedule right ESWL  Hollice Espy, MD  [place] [address] [place], [place] [address] [phone]  I spent 25 min with this patient of which greater than 50% was spent in counseling and coordination of care with the patient.

## 2017-12-24 NOTE — H&P (View-Only) (Signed)
12/24/2017 2:09 PM   Timothy Singleton 08/16/1946 [id number]  Referring provider: Jerrol Banana., MD [address] [place], [place] [address]  Chief Complaint  Patient presents with  . Nephrolithiasis    HPI: 72 year old male with a history of nephrolithiasis who returns today for annual follow-up.  He does have a known asymptomatic right-sided stone, previously measuring 12 mm.  It appears to have increased slightly in size today up to 15 mm.  He continues to be asymptomatic.  No flank pain or gross hematuria.  He does also have a relatively small nonobstructing stone on the left as well.  He does have a personal history of kidney stones.  He is s/p ESWL in ~2012 by Dr. Bernardo Heater.   He does admit that he does not drink enough water but is tring to drink more since last visit.  He's never had a 51-VOHY urine metabolic workup.   Stone Analysis 95% calcium oxalate monohydrate, 2% calcium oxalate dihydrate, 3% calcium carbonate.  He does have history of BPH with weak stream which is has improved on Flomax.  He reports today that his stream is good, he is able to empty his bladder, has no urgency or frequency, and gets up 1-2 times at night to void. He is overall pleased with his voiding symptoms. No UTIs or gross hematuria.  This is essentially unchanged from his previous visit.   Last PSA 1.8 on 12/23/16, further PSA screening deferred.   He does mention today that he has been seeking care at a private clinic called [facility] in [place] for hormonal therapy.  He and his wife had been abstinent from sexual activity for many years and his wife and he have elected to seek treatment for this.  They are both being treated with hormonal therapy.  He is receiving testosterone implants.  He is unsure what his testosterone levels were prior to initiation of therapy or if they are monitoring his PSA/hematocrit, lipids, amongst others.  He is also using some sort of milk tab for  erectile dysfunction which seems to help and maintain his erections longer.  PMH: Past Medical History:  Diagnosis Date  . Arthritis   . Cancer ([place])    melanoma / knee  . Depression   . GERD (gastroesophageal reflux disease)   . Hyperlipemia   . Hypertension   . Past heart attack   . Sleep apnea     Surgical History: Past Surgical History:  Procedure Laterality Date  . BUNIONECTOMY    . COLONOSCOPY WITH PROPOFOL N/A 11/17/2016   Procedure: COLONOSCOPY WITH PROPOFOL;  Surgeon: Jonathon Bellows, MD;  Location: [facility] ENDOSCOPY;  Service: Endoscopy;  Laterality: N/A;  . CORONARY STENT PLACEMENT    . EXTRACORPOREAL SHOCK WAVE LITHOTRIPSY    . HERNIA REPAIR     inguinal-right  . JOINT REPLACEMENT     total shoulder replacement  . KNEE SURGERY Right   . TONSILLECTOMY    . TOTAL SHOULDER REPLACEMENT    . UPPER GI ENDOSCOPY  10/18/01   hiatus hernia  . VASECTOMY    . WRIST SURGERY      Home Medications:  Allergies as of 12/24/2017   No Known Allergies     Medication List        Accurate as of 12/24/17 11:59 PM. Always use your most recent med list.          ACID RELIEF PO Take by mouth daily.   aspirin 81 MG tablet  Take 81 mg by mouth daily.   carbamazepine 200 MG 12 hr capsule Commonly known as:  CARBATROL Take 1 capsule (200 mg total) by mouth 2 (two) times daily.   cyclobenzaprine 10 MG tablet Commonly known as:  FLEXERIL Take 1 tablet (10 mg total) by mouth 3 (three) times daily as needed for muscle spasms.   ezetimibe 10 MG tablet Commonly known as:  ZETIA Take 1 tablet (10 mg total) by mouth daily.   lisinopril 20 MG tablet Commonly known as:  PRINIVIL,ZESTRIL   sertraline 100 MG tablet Commonly known as:  ZOLOFT Take 1 tablet (100 mg total) by mouth daily.   sildenafil 20 MG tablet Commonly known as:  REVATIO 1 to 4 tablets daily as needed   tamsulosin 0.4 MG Caps capsule Commonly known as:  FLOMAX TAKE 1 CAPSULE (0.4 MG TOTAL) BY MOUTH DAILY.         Allergies: No Known Allergies  Family History: Family History  Problem Relation Age of Onset  . Cancer Mother   . Dementia Mother   . Stroke Father   . Heart disease Father   . Hypertension Father   . Breast cancer Sister   . Parkinson's disease Brother   . Hypertension Brother   . Melanoma Maternal Grandmother   . Bladder Cancer Neg Hx   . Prostate cancer Neg Hx   . Kidney cancer Neg Hx     Social History:  reports that he has quit smoking. He has quit using smokeless tobacco. He reports that he drinks alcohol. He reports that he does not use drugs.  ROS: UROLOGY Frequent Urination?: Yes Hard to postpone urination?: Yes Burning/pain with urination?: No Get up at night to urinate?: Yes Leakage of urine?: No Urine stream starts and stops?: No Trouble starting stream?: No Do you have to strain to urinate?: No Blood in urine?: No Urinary tract infection?: No Sexually transmitted disease?: No Injury to kidneys or bladder?: No Painful intercourse?: No Weak stream?: No Erection problems?: No Penile pain?: No  Gastrointestinal Nausea?: No Vomiting?: No Indigestion/heartburn?: No Diarrhea?: No Constipation?: No  Constitutional Fever: No Night sweats?: No Weight loss?: No Fatigue?: No  Skin Skin rash/lesions?: No Itching?: No  Eyes Blurred vision?: No Double vision?: No  Ears/Nose/Throat Sore throat?: No Sinus problems?: No  Hematologic/Lymphatic Swollen glands?: No Easy bruising?: No  Cardiovascular Leg swelling?: No Chest pain?: No  Respiratory Cough?: No Shortness of breath?: No  Endocrine Excessive thirst?: No  Musculoskeletal Back pain?: No Joint pain?: No  Neurological Headaches?: No Dizziness?: No  Psychologic Depression?: No Anxiety?: No  Physical Exam: BP 114/69   Pulse 65   Resp 16   Ht 5\' 11"  (1.803 m)   Wt 167 lb (75.8 kg)   SpO2 98%   BMI 23.29 kg/m   Constitutional:  Alert and oriented, No acute  distress. HEENT: [place] AT, moist mucus membranes.  Trachea midline, no masses. Cardiovascular: No clubbing, cyanosis, or edema. Respiratory: Normal respiratory effort, no increased work of breathing. GI: Abdomen is soft, nontender, nondistended, no abdominal masses GU: No CVA tenderness. Skin: No rashes, bruises or suspicious lesions. Neurologic: Grossly intact, no focal deficits, moving all 4 extremities. Psychiatric: Normal mood and affect.  Laboratory Data: Lab Results  Component Value Date   WBC 5.9 10/04/2017   HGB 16.1 10/04/2017   HCT 48.1 10/04/2017   MCV 87.9 10/04/2017   PLT 224 10/04/2017    Lab Results  Component Value Date   CREATININE 1.18 10/04/2017  KUB: CLINICAL DATA:  Pt states nephrolithiasis for several years; pt states f/u and denies any current symptoms  EXAM: ABDOMEN - 1 VIEW  COMPARISON:  Abdomen plain film dated 12/23/2016  FINDINGS: Stable appearance of the right renal stones, largest again measuring 12 mm. Faint left-sided renal stones also appear stable, largest measuring approximately 4 mm. No evidence of ureteral stone identified.  Bowel gas pattern is nonobstructive. No evidence of soft tissue mass or abnormal fluid collection. No evidence of free intraperitoneal air. No acute or suspicious osseous finding.  IMPRESSION: 1. No acute findings.  No evidence of ureteral stone. 2. Bilateral nephrolithiasis, stable appearance.   Electronically Signed   By: Franki Cabot M.D.   On: 12/24/2017 11:38   Reviewed today personally.  On my interpretation, feel that the right-sided stone appears to be enlarging, measures up to 15 mm in length on my measurement.  Assessment & Plan:   1. Kidney stones Multiple nonobstructing kidney stones, largest measuring 15 mm right  Given the size of the right-sided stone which appears to be enlarging slightly from a year ago, I Discussed alternatives today including ESWL versus ureteroscopy vs.  Ongoing observation.  He understands that if this stone should pass, he would need surgical intervention. Risk/ benefits of each were discussed.    He is most interested in shockwave lithotripsy which she is done well with in the past.  Given the size of the stone, I specifically discussed the possibility of development of [place] or need for staged procedure.  He was offered placement of a preoperative stent as a precaution.  He would prefer to proceed with shockwave lithotripsy and if an urgent or emergent stent is needed, then pursue that as needed.  He understands all additional risks including failure of the procedure, damage to surrounding structures, hematuria, hematoma, arrhythmia, infection need for multiple procedures as above..  Plan for right shockwave lithotripsy.   2. Benign prostatic hyperplasia with weak urinary stream Continue flomax, minimal bother - PSA  3. Hypogonadism in male Presumably diagnosed with hypogonadism by private cash pay clinic Unclear whether this is supervised by a physician or if he is received the appropriate diagnostic evaluation and monitoring I expressed my concerns as well as discussed the risks and benefits of treatment of hypogonadism He will ask for his lab results and bring them to his next appointment for me to review  Schedule right ESWL  Hollice Espy, MD  [facility] [address], [place], [place] [address] [phone]  I spent 25 min with this patient of which greater than 50% was spent in counseling and coordination of care with the patient.

## 2017-12-27 ENCOUNTER — Other Ambulatory Visit: Payer: Self-pay

## 2017-12-27 ENCOUNTER — Telehealth: Payer: Self-pay | Admitting: Radiology

## 2017-12-27 ENCOUNTER — Other Ambulatory Visit: Payer: Self-pay | Admitting: Radiology

## 2017-12-27 DIAGNOSIS — N200 Calculus of kidney: Secondary | ICD-10-CM

## 2017-12-27 NOTE — Telephone Encounter (Signed)
Appt made. Pt was still asleep but wife will inform pt.

## 2017-12-27 NOTE — Telephone Encounter (Signed)
-----   Message from Hollice Espy, MD sent at 12/25/2017  2:11 PM EST ----- Didn't get a urine on him Friday.  Can you see if he can come in Monday to drop one off?

## 2017-12-28 ENCOUNTER — Telehealth: Payer: Self-pay | Admitting: Urology

## 2017-12-28 ENCOUNTER — Encounter: Payer: Self-pay | Admitting: Urology

## 2017-12-28 ENCOUNTER — Other Ambulatory Visit: Payer: Self-pay

## 2017-12-28 ENCOUNTER — Other Ambulatory Visit: Payer: PPO

## 2017-12-28 DIAGNOSIS — R972 Elevated prostate specific antigen [PSA]: Secondary | ICD-10-CM

## 2017-12-28 DIAGNOSIS — N200 Calculus of kidney: Secondary | ICD-10-CM | POA: Diagnosis not present

## 2017-12-28 LAB — URINALYSIS, COMPLETE
BILIRUBIN UA: NEGATIVE
GLUCOSE, UA: NEGATIVE
KETONES UA: NEGATIVE
Nitrite, UA: NEGATIVE
Protein, UA: NEGATIVE
SPEC GRAV UA: 1.015 (ref 1.005–1.030)
UUROB: 0.2 mg/dL (ref 0.2–1.0)
pH, UA: 6.5 (ref 5.0–7.5)

## 2017-12-28 LAB — MICROSCOPIC EXAMINATION: Epithelial Cells (non renal): NONE SEEN /hpf (ref 0–10)

## 2017-12-28 NOTE — Telephone Encounter (Signed)
Please thank the patient for sending his labs.  Please let him know that his PSA has gone from 1.6- --> 2.7 over the course of the year which is a fairly significant rise.  This is likely related to testosterone therapy.  This is somewhat concerning.    I would like him to have this repeated in 6 months and follow up with me thereafter.    Hollice Espy, MD

## 2017-12-29 NOTE — Telephone Encounter (Signed)
I have called pt and informed of the PSA. Labs for future PSA have been ordered. Please call and schedule follow-up appointment w/ Dr Erlene Quan for 6 months.

## 2017-12-30 ENCOUNTER — Ambulatory Visit: Payer: PPO

## 2017-12-30 ENCOUNTER — Ambulatory Visit
Admission: RE | Admit: 2017-12-30 | Discharge: 2017-12-30 | Disposition: A | Discharge status: Home / Self Care | Payer: PPO | Source: Ambulatory Visit | Attending: Urology | Admitting: Urology

## 2017-12-30 ENCOUNTER — Encounter
Admission: RE | Disposition: A | Discharge status: Home / Self Care | Payer: Self-pay | Source: Ambulatory Visit | Attending: Urology

## 2017-12-30 DIAGNOSIS — K449 Diaphragmatic hernia without obstruction or gangrene: Secondary | ICD-10-CM | POA: Diagnosis not present

## 2017-12-30 DIAGNOSIS — K219 Gastro-esophageal reflux disease without esophagitis: Secondary | ICD-10-CM | POA: Insufficient documentation

## 2017-12-30 DIAGNOSIS — M1990 Unspecified osteoarthritis, unspecified site: Secondary | ICD-10-CM | POA: Diagnosis not present

## 2017-12-30 DIAGNOSIS — Z803 Family history of malignant neoplasm of breast: Secondary | ICD-10-CM | POA: Diagnosis not present

## 2017-12-30 DIAGNOSIS — Z808 Family history of malignant neoplasm of other organs or systems: Secondary | ICD-10-CM | POA: Insufficient documentation

## 2017-12-30 DIAGNOSIS — E785 Hyperlipidemia, unspecified: Secondary | ICD-10-CM | POA: Insufficient documentation

## 2017-12-30 DIAGNOSIS — Z955 Presence of coronary angioplasty implant and graft: Secondary | ICD-10-CM | POA: Insufficient documentation

## 2017-12-30 DIAGNOSIS — Z85820 Personal history of malignant melanoma of skin: Secondary | ICD-10-CM | POA: Insufficient documentation

## 2017-12-30 DIAGNOSIS — Z809 Family history of malignant neoplasm, unspecified: Secondary | ICD-10-CM | POA: Diagnosis not present

## 2017-12-30 DIAGNOSIS — F329 Major depressive disorder, single episode, unspecified: Secondary | ICD-10-CM | POA: Insufficient documentation

## 2017-12-30 DIAGNOSIS — Z823 Family history of stroke: Secondary | ICD-10-CM | POA: Diagnosis not present

## 2017-12-30 DIAGNOSIS — Z79899 Other long term (current) drug therapy: Secondary | ICD-10-CM | POA: Diagnosis not present

## 2017-12-30 DIAGNOSIS — Z7982 Long term (current) use of aspirin: Secondary | ICD-10-CM | POA: Diagnosis not present

## 2017-12-30 DIAGNOSIS — Z820 Family history of epilepsy and other diseases of the nervous system: Secondary | ICD-10-CM | POA: Insufficient documentation

## 2017-12-30 DIAGNOSIS — Z87891 Personal history of nicotine dependence: Secondary | ICD-10-CM | POA: Diagnosis not present

## 2017-12-30 DIAGNOSIS — Z8249 Family history of ischemic heart disease and other diseases of the circulatory system: Secondary | ICD-10-CM | POA: Insufficient documentation

## 2017-12-30 DIAGNOSIS — I252 Old myocardial infarction: Secondary | ICD-10-CM | POA: Insufficient documentation

## 2017-12-30 DIAGNOSIS — Z96611 Presence of right artificial shoulder joint: Secondary | ICD-10-CM | POA: Insufficient documentation

## 2017-12-30 DIAGNOSIS — G4730 Sleep apnea, unspecified: Secondary | ICD-10-CM | POA: Diagnosis not present

## 2017-12-30 DIAGNOSIS — Z87442 Personal history of urinary calculi: Secondary | ICD-10-CM | POA: Diagnosis not present

## 2017-12-30 DIAGNOSIS — N200 Calculus of kidney: Secondary | ICD-10-CM | POA: Diagnosis not present

## 2017-12-30 DIAGNOSIS — N401 Enlarged prostate with lower urinary tract symptoms: Secondary | ICD-10-CM | POA: Insufficient documentation

## 2017-12-30 DIAGNOSIS — I10 Essential (primary) hypertension: Secondary | ICD-10-CM | POA: Diagnosis not present

## 2017-12-30 HISTORY — PX: EXTRACORPOREAL SHOCK WAVE LITHOTRIPSY: SHX1557

## 2017-12-30 LAB — CULTURE, URINE COMPREHENSIVE

## 2017-12-30 SURGERY — LITHOTRIPSY, ESWL
Anesthesia: Moderate Sedation | Laterality: Right

## 2017-12-30 MED ORDER — DIPHENHYDRAMINE HCL 25 MG PO CAPS
25.0000 mg | ORAL_CAPSULE | ORAL | Status: AC
  Administered 2017-12-30: 25 mg via ORAL

## 2017-12-30 MED ORDER — HYDROCODONE-ACETAMINOPHEN 5-325 MG PO TABS
1.0000 | ORAL_TABLET | Freq: Four times a day (QID) | ORAL | Status: DC | PRN
  Administered 2017-12-30: 1 via ORAL

## 2017-12-30 MED ORDER — HYDROCODONE-ACETAMINOPHEN 5-325 MG PO TABS
ORAL_TABLET | ORAL | Status: AC
  Filled 2017-12-30: qty 1

## 2017-12-30 MED ORDER — SODIUM CHLORIDE 0.9 % IV SOLN
INTRAVENOUS | Status: DC
  Administered 2017-12-30: 09:00:00 via INTRAVENOUS

## 2017-12-30 MED ORDER — DIAZEPAM 5 MG PO TABS
10.0000 mg | ORAL_TABLET | ORAL | Status: AC
  Administered 2017-12-30: 10 mg via ORAL

## 2017-12-30 MED ORDER — HYDROCODONE-ACETAMINOPHEN 5-325 MG PO TABS: 1.0000 | ORAL_TABLET | Freq: Four times a day (QID) | ORAL | 0 refills | Status: DC | PRN

## 2017-12-30 MED ORDER — DOCUSATE SODIUM 100 MG PO CAPS: 100.0000 mg | ORAL_CAPSULE | Freq: Two times a day (BID) | ORAL | 0 refills | Status: DC

## 2017-12-30 MED ORDER — CIPROFLOXACIN HCL 500 MG PO TABS
500.0000 mg | ORAL_TABLET | ORAL | Status: AC
  Administered 2017-12-30: 500 mg via ORAL

## 2017-12-30 MED ORDER — DIPHENHYDRAMINE HCL 25 MG PO CAPS
ORAL_CAPSULE | ORAL | Status: AC
  Administered 2017-12-30: 25 mg via ORAL
  Filled 2017-12-30: qty 1

## 2017-12-30 MED ORDER — ONDANSETRON HCL 4 MG/2ML IJ SOLN
INTRAMUSCULAR | Status: AC
  Administered 2017-12-30: 4 mg via INTRAVENOUS
  Filled 2017-12-30: qty 2

## 2017-12-30 MED ORDER — ONDANSETRON HCL 4 MG/2ML IJ SOLN
4.0000 mg | Freq: Once | INTRAMUSCULAR | Status: AC | PRN
  Administered 2017-12-30: 4 mg via INTRAVENOUS

## 2017-12-30 MED ORDER — CIPROFLOXACIN HCL 500 MG PO TABS
ORAL_TABLET | ORAL | Status: AC
  Administered 2017-12-30: 500 mg via ORAL
  Filled 2017-12-30: qty 1

## 2017-12-30 MED ORDER — DIAZEPAM 5 MG PO TABS
ORAL_TABLET | ORAL | Status: AC
  Administered 2017-12-30: 10 mg via ORAL
  Filled 2017-12-30: qty 2

## 2017-12-30 NOTE — Telephone Encounter (Signed)
App has been made and mailed to the patient ° ° °Timothy Singleton °

## 2017-12-30 NOTE — Discharge Instructions (Signed)
See [facility] discharge instructions in chart.  

## 2017-12-30 NOTE — Interval H&P Note (Signed)
History and Physical Interval Note:  12/30/2017 9:59 AM  Timothy Singleton  has presented today for surgery, with the diagnosis of Kidney stone  The various methods of treatment have been discussed with the patient and family. After consideration of risks, benefits and other options for treatment, the patient has consented to  Procedure(s): EXTRACORPOREAL SHOCK WAVE LITHOTRIPSY (ESWL) (Right) as a surgical intervention .  The patient's history has been reviewed, patient examined, no change in status, stable for surgery.  I have reviewed the patient's chart and labs.  Questions were answered to the patient's satisfaction.    RRR CTAB  Hollice Espy

## 2018-01-20 NOTE — Telephone Encounter (Signed)
complete

## 2018-01-21 ENCOUNTER — Encounter: Payer: Self-pay | Admitting: Urology

## 2018-01-21 ENCOUNTER — Ambulatory Visit (INDEPENDENT_AMBULATORY_CARE_PROVIDER_SITE_OTHER): Payer: PPO | Admitting: Urology

## 2018-01-21 ENCOUNTER — Ambulatory Visit
Admission: RE | Admit: 2018-01-21 | Discharge: 2018-01-21 | Disposition: A | Discharge status: Home / Self Care | Payer: PPO | Source: Ambulatory Visit | Attending: Urology | Admitting: Urology

## 2018-01-21 VITALS — BP 160/82 | HR 57 | Ht 71.0 in | Wt 172.2 lb

## 2018-01-21 DIAGNOSIS — N200 Calculus of kidney: Secondary | ICD-10-CM | POA: Diagnosis not present

## 2018-01-21 DIAGNOSIS — R972 Elevated prostate specific antigen [PSA]: Secondary | ICD-10-CM | POA: Diagnosis not present

## 2018-01-21 DIAGNOSIS — E291 Testicular hypofunction: Secondary | ICD-10-CM | POA: Diagnosis not present

## 2018-01-21 NOTE — Progress Notes (Signed)
01/21/2018 12:29 PM   Timothy Singleton 09-16-1946 [id number]  Referring provider: Jerrol Banana., MD [address] [place], [place] [address]  Chief Complaint  Patient presents with  . Nephrolithiasis    HPI: 72 year old male with a history of nephrolithiasis who presents 2 weeks today following ESWL for a 15 mm right nonobstructing calculus.  Please see previous notes for details.  He tolerated the lithotripsy very well.  He has had very minimal discomfort and currently has none.  No voiding symptoms.  No gross hematuria.  KUB today shows interval fragmentation of the large stone with some residual stone burden measuring up to 7 mm in largest diameter.  He does have some phleboliths in his pelvis but no obvious ureteral calculi.  He is under treatment for hypogonadism managed by a private clinic, [facility] which is not covered by his insurance.  This is improved his sex life dramatically as well as his energy level.  He is using Testopel pellets.  It is unclear from the records what his baseline testosterone levels were.  He may be interested in transferring his care to [place] for this.    Notably, after starting Testopel, his PSA has gone from 1.6 a year ago to most recently 2.7.  Repeat in 6 months was recommended.   PMH: Past Medical History:  Diagnosis Date  . Arthritis   . Cancer ([place])    melanoma / knee  . Depression   . GERD (gastroesophageal reflux disease)   . Hyperlipemia   . Hypertension   . Past heart attack   . Sleep apnea     Surgical History: Past Surgical History:  Procedure Laterality Date  . BUNIONECTOMY    . COLONOSCOPY WITH PROPOFOL N/A 11/17/2016   Procedure: COLONOSCOPY WITH PROPOFOL;  Surgeon: Jonathon Bellows, MD;  Location: [facility] ENDOSCOPY;  Service: Endoscopy;  Laterality: N/A;  . CORONARY STENT PLACEMENT    . EXTRACORPOREAL SHOCK WAVE LITHOTRIPSY    . EXTRACORPOREAL SHOCK WAVE LITHOTRIPSY Right 12/30/2017   Procedure:  EXTRACORPOREAL SHOCK WAVE LITHOTRIPSY (ESWL);  Surgeon: Hollice Espy, MD;  Location: [facility] ORS;  Service: Urology;  Laterality: Right;  . HERNIA REPAIR     inguinal-right  . JOINT REPLACEMENT     total shoulder replacement  . KNEE SURGERY Right   . TONSILLECTOMY    . TOTAL SHOULDER REPLACEMENT    . UPPER GI ENDOSCOPY  10/18/01   hiatus hernia  . VASECTOMY    . WRIST SURGERY      Home Medications:  Allergies as of 01/21/2018   No Known Allergies     Medication List        Accurate as of 01/21/18 11:59 PM. Always use your most recent med list.          ACID RELIEF PO Take by mouth daily.   aspirin 81 MG tablet Take 81 mg by mouth daily.   carbamazepine 200 MG 12 hr capsule Commonly known as:  CARBATROL Take 1 capsule (200 mg total) by mouth 2 (two) times daily.   docusate sodium 100 MG capsule Commonly known as:  COLACE Take 1 capsule (100 mg total) by mouth 2 (two) times daily.   HYDROcodone-acetaminophen 5-325 MG tablet Commonly known as:  NORCO/VICODIN Take 1-2 tablets by mouth every 6 (six) hours as needed for moderate pain.   lisinopril 20 MG tablet Commonly known as:  PRINIVIL,ZESTRIL   sertraline 100 MG tablet Commonly known as:  ZOLOFT Take 1 tablet (100  mg total) by mouth daily.   sildenafil 20 MG tablet Commonly known as:  REVATIO 1 to 4 tablets daily as needed   tamsulosin 0.4 MG Caps capsule Commonly known as:  FLOMAX TAKE 1 CAPSULE (0.4 MG TOTAL) BY MOUTH DAILY.       Allergies: No Known Allergies  Family History: Family History  Problem Relation Age of Onset  . Cancer Mother   . Dementia Mother   . Stroke Father   . Heart disease Father   . Hypertension Father   . Breast cancer Sister   . Parkinson's disease Brother   . Hypertension Brother   . Melanoma Maternal Grandmother   . Bladder Cancer Neg Hx   . Prostate cancer Neg Hx   . Kidney cancer Neg Hx     Social History:  reports that he has quit smoking. He has quit using  smokeless tobacco. He reports that he drinks alcohol. He reports that he does not use drugs.  ROS: UROLOGY Frequent Urination?: No Hard to postpone urination?: No Burning/pain with urination?: No Get up at night to urinate?: Yes Leakage of urine?: No Urine stream starts and stops?: No Trouble starting stream?: No Do you have to strain to urinate?: No Blood in urine?: No Urinary tract infection?: No Sexually transmitted disease?: No Injury to kidneys or bladder?: No Painful intercourse?: No Weak stream?: Yes Erection problems?: No Penile pain?: No  Gastrointestinal Nausea?: No Vomiting?: No Indigestion/heartburn?: No Diarrhea?: No Constipation?: No  Constitutional Fever: No Night sweats?: No Weight loss?: No Fatigue?: No  Skin Skin rash/lesions?: No Itching?: No  Eyes Blurred vision?: No Double vision?: No  Ears/Nose/Throat Sore throat?: No Sinus problems?: No  Hematologic/Lymphatic Swollen glands?: No Easy bruising?: No  Cardiovascular Leg swelling?: No Chest pain?: No  Respiratory Cough?: No Shortness of breath?: No  Endocrine Excessive thirst?: No  Musculoskeletal Back pain?: No Joint pain?: No  Neurological Headaches?: No Dizziness?: No  Psychologic Depression?: No Anxiety?: No  Physical Exam: BP (!) 160/82 (BP Location: Left Arm, Patient Position: Sitting, Cuff Size: Normal)   Pulse (!) 57   Ht 5\' 11"  (1.803 m)   Wt 172 lb 3.2 oz (78.1 kg)   BMI 24.02 kg/m   Constitutional:  Alert and oriented, No acute distress. HEENT: [place] AT, moist mucus membranes.  Trachea midline, no masses. Cardiovascular: No clubbing, cyanosis, or edema. Respiratory: Normal respiratory effort, no increased work of breathing. GI: Abdomen is soft, nontender, nondistended, no abdominal masses GU: No CVA tenderness Skin: No rashes, bruises or suspicious lesions. Neurologic: Grossly intact, no focal deficits, moving all 4 extremities. Psychiatric: Normal mood  and affect.  Laboratory Data: Lab Results  Component Value Date   WBC 5.9 10/04/2017   HGB 16.1 10/04/2017   HCT 48.1 10/04/2017   MCV 87.9 10/04/2017   PLT 224 10/04/2017    Lab Results  Component Value Date   CREATININE 1.18 10/04/2017    Lab Results  Component Value Date   HGBA1C 5.1 11/02/2016    Urinalysis N/a  Pertinent Imaging: CLINICAL DATA:  Right nephrolithiasis, recent lithotripsy  EXAM: ABDOMEN - 1 VIEW  COMPARISON:  12/30/2017  FINDINGS: Scattered large and small bowel gas is noted. Stable left renal calculi are noted. The previously seen large right renal stone has decreased significantly in size now measuring approximately 8 mm in greatest dimension. A few smaller fragments are noted. Multiple small calcifications are noted within the pelvis which were not as well visualized on the prior exam. These  may represent small fragments within the bladder or distal right ureter. The need for further follow-up can be determined on a clinical basis.  IMPRESSION: Significant decrease in the size of the large dominant right renal stone.  Stable left renal calculi.  Multiple small calcifications are noted within the pelvis not seen on the prior exam which may be related to fragments passed into the bladder.   Electronically Signed   By: Inez Catalina M.D.   On: 01/21/2018 14:17  KUB personally reviewed today and with the patient.  This is compared to his preoperative KUB from 12/30/2017.  Assessment & Plan:    1. Kidney stones Status post ESWL for largest stone Excellent fragmentation but significant residual stone burden Questionable ureteral stones although asymptomatic Recommend conservative management, hydration, and return in 1 month for repeat KUB - DG Abd 1 View; Future  2. Hypogonadism in male Managed at a private clinic, cash pay with Testopel injections Interested in potentially transferring is here to [facility] for  treatment of his hypogonadism Will need baseline testosterone levels for documentation in order to facilitate insurance coverage He will bring these with him at his next visit  3. Elevated PSA Rising PSA since starting Testopel as noted on labs from [facility]  clinic Recommend six-month repeat to assess overall trend   Return in about 1 month (around 02/20/2018) for KUB, lab review.  Hollice Espy, MD  [facility] [address] [place], [place] [address] [phone]

## 2018-01-25 ENCOUNTER — Other Ambulatory Visit: Payer: Self-pay

## 2018-02-23 ENCOUNTER — Encounter: Payer: Self-pay | Admitting: Urology

## 2018-02-23 ENCOUNTER — Ambulatory Visit
Admission: RE | Admit: 2018-02-23 | Discharge: 2018-02-23 | Disposition: A | Discharge status: Home / Self Care | Payer: PPO | Source: Ambulatory Visit | Attending: Urology | Admitting: Urology

## 2018-02-23 ENCOUNTER — Ambulatory Visit: Payer: PPO | Admitting: Urology

## 2018-02-23 VITALS — BP 162/78 | HR 59 | Ht 71.0 in | Wt 165.0 lb

## 2018-02-23 DIAGNOSIS — N200 Calculus of kidney: Secondary | ICD-10-CM

## 2018-02-23 DIAGNOSIS — R972 Elevated prostate specific antigen [PSA]: Secondary | ICD-10-CM

## 2018-02-23 DIAGNOSIS — E291 Testicular hypofunction: Secondary | ICD-10-CM

## 2018-02-23 NOTE — Progress Notes (Signed)
02/23/2018 12:55 PM   Timothy Singleton Mar 11, 1946 [id number]  Referring provider: Jerrol Banana., MD [address] [place], [place] [address]  Chief Complaint  Patient presents with  . Nephrolithiasis    1 month w/KUB    HPI: 72 year old male with a history of nephrolithiasis s/p ESWL for a 15 mm right nonobstructing calculus on 12/30/17, hypogonadism, and rising PSA.  Interval KUB on 01/2018 shows fragmentation of the large stone with a dominant 7 mm stone fragment.  Since last visit, he is continued to pass small fragments which were sand-like in nature.  KUB today shows essentially stable stone burden from last visit with a 7 mm stone on the right and a punctate stone on the left.  He denies any flank pain or gross hematuria.  He is under treatment for hypogonadism managed by a private clinic, [facility] which is not covered by his insurance.  This is improved his sex life dramatically as well as his energy level.  He is using Testopel pellets and in fact is due for his next implant tomorrow.  It is unclear from the records what his baseline testosterone levels were.  He may be interested in transferring his care to [place] for this.  He did obtain records for [place] with previous testosterone values but none of which appeared to be his initial baseline testosterone level.  Notably, after starting Testopel, his PSA has gone from 1.6 a year ago to most recently 2.7.  Repeat in 6 months was recommended, scheduled for 06/2018.  He denies any urinary complaints today.   PMH: Past Medical History:  Diagnosis Date  . Arthritis   . Cancer ([place])    melanoma / knee  . Depression   . GERD (gastroesophageal reflux disease)   . Hyperlipemia   . Hypertension   . Past heart attack   . Sleep apnea     Surgical History: Past Surgical History:  Procedure Laterality Date  . BUNIONECTOMY    . COLONOSCOPY WITH PROPOFOL N/A 11/17/2016   Procedure: COLONOSCOPY WITH PROPOFOL;   Surgeon: Jonathon Bellows, MD;  Location: [facility] ENDOSCOPY;  Service: Endoscopy;  Laterality: N/A;  . CORONARY STENT PLACEMENT    . EXTRACORPOREAL SHOCK WAVE LITHOTRIPSY    . EXTRACORPOREAL SHOCK WAVE LITHOTRIPSY Right 12/30/2017   Procedure: EXTRACORPOREAL SHOCK WAVE LITHOTRIPSY (ESWL);  Surgeon: Hollice Espy, MD;  Location: [facility] ORS;  Service: Urology;  Laterality: Right;  . HERNIA REPAIR     inguinal-right  . JOINT REPLACEMENT     total shoulder replacement  . KNEE SURGERY Right   . TONSILLECTOMY    . TOTAL SHOULDER REPLACEMENT    . UPPER GI ENDOSCOPY  10/18/01   hiatus hernia  . VASECTOMY    . WRIST SURGERY      Home Medications:  Allergies as of 02/23/2018   No Known Allergies     Medication List        Accurate as of 02/23/18 12:55 PM. Always use your most recent med list.          ACID RELIEF PO Take by mouth daily.   aspirin 81 MG tablet Take 81 mg by mouth daily.   carbamazepine 200 MG 12 hr capsule Commonly known as:  CARBATROL Take 1 capsule (200 mg total) by mouth 2 (two) times daily.   docusate sodium 100 MG capsule Commonly known as:  COLACE Take 1 capsule (100 mg total) by mouth 2 (two) times daily.   lisinopril 20  MG tablet Commonly known as:  PRINIVIL,ZESTRIL   sertraline 100 MG tablet Commonly known as:  ZOLOFT Take 1 tablet (100 mg total) by mouth daily.   sildenafil 20 MG tablet Commonly known as:  REVATIO 1 to 4 tablets daily as needed   tamsulosin 0.4 MG Caps capsule Commonly known as:  FLOMAX TAKE 1 CAPSULE (0.4 MG TOTAL) BY MOUTH DAILY.       Allergies: No Known Allergies  Family History: Family History  Problem Relation Age of Onset  . Cancer Mother   . Dementia Mother   . Stroke Father   . Heart disease Father   . Hypertension Father   . Breast cancer Sister   . Parkinson's disease Brother   . Hypertension Brother   . Melanoma Maternal Grandmother   . Bladder Cancer Neg Hx   . Prostate cancer Neg Hx   . Kidney cancer Neg  Hx     Social History:  reports that he has quit smoking. He has quit using smokeless tobacco. He reports that he drinks alcohol. He reports that he does not use drugs.  ROS: UROLOGY Frequent Urination?: No Hard to postpone urination?: No Burning/pain with urination?: No Get up at night to urinate?: No Leakage of urine?: No Urine stream starts and stops?: No Trouble starting stream?: No Do you have to strain to urinate?: No Blood in urine?: No Urinary tract infection?: No Sexually transmitted disease?: No Injury to kidneys or bladder?: No Painful intercourse?: No Weak stream?: No Erection problems?: No Penile pain?: No  Gastrointestinal Nausea?: No Vomiting?: No Indigestion/heartburn?: No Diarrhea?: No Constipation?: No  Constitutional Fever: No Night sweats?: No Weight loss?: No Fatigue?: No  Skin Skin rash/lesions?: No Itching?: No  Eyes Blurred vision?: No Double vision?: No  Ears/Nose/Throat Sore throat?: No Sinus problems?: No  Hematologic/Lymphatic Swollen glands?: No Easy bruising?: No  Cardiovascular Leg swelling?: No Chest pain?: No  Respiratory Cough?: No Shortness of breath?: No  Endocrine Excessive thirst?: No  Musculoskeletal Back pain?: No Joint pain?: No  Neurological Headaches?: No Dizziness?: No  Psychologic Depression?: No Anxiety?: No  Physical Exam: BP (!) 162/78   Pulse (!) 59   Ht 5\' 11"  (1.803 m)   Wt 165 lb (74.8 kg)   BMI 23.01 kg/m   Constitutional:  Alert and oriented, No acute distress. HEENT: [place] AT, moist mucus membranes.  Trachea midline, no masses. Cardiovascular: No clubbing, cyanosis, or edema. Respiratory: Normal respiratory effort, no increased work of breathing. Skin: No rashes, bruises or suspicious lesions. Neurologic: Grossly intact, no focal deficits, moving all 4 extremities. Psychiatric: Normal mood and affect.  Laboratory Data: Lab Results  Component Value Date   WBC 5.9 10/04/2017    HGB 16.1 10/04/2017   HCT 48.1 10/04/2017   MCV 87.9 10/04/2017   PLT 224 10/04/2017    Lab Results  Component Value Date   CREATININE 1.18 10/04/2017    Lab Results  Component Value Date   HGBA1C 5.1 11/02/2016    Urinalysis N/a  Pertinent Imaging: KUB personally reviewed today and with the patient.  This is compared to his preoperative KUB from 12/30/2017.  Stone burden dramatically decreased from preop but stable from last KUB on 01/21/2018.    Assessment & Plan:    1. Kidney stones Status post ESWL for largest stone Excellent fragmentation but significant residual stone burden, dominant fragment now 7 mm Discussed options for residual stone burden including staged shockwave, ureteroscopy and/or observation He is interested in observation at this  point time he will return sooner as needed  2. Hypogonadism in male Managed at a private clinic, cash pay with Testopel injections Interested in potentially transferring is here to [facility] urological for treatment of his hypogonadism Will need baseline testosterone levels for documentation in order to facilitate insurance coverage-this was written out specifically for him today as a reminder in order to obtain records for documentation purposes He will bring these with him at his next visit or sooner  3. Elevated PSA Rising PSA since starting Testopel as noted on labs from [facility] Recommend six-month repeat to assess overall trend Scheduled for visit in 06/2018  Keep follow-up in 06/2018 as scheduled  Hollice Espy, MD  [facility] [address] [place], [place] [address] [phone]

## 2018-02-23 NOTE — Patient Instructions (Signed)
1.  Check with insurance about coverage for hypogonadism/ testosterone therapy.  What is covered or preferred?  Is insurance being filed at [facility]?  2.  Get original labs from [facility] with low Testerone level.  Hollice Espy, MD

## 2018-03-07 ENCOUNTER — Other Ambulatory Visit: Payer: Self-pay

## 2018-03-08 DIAGNOSIS — H16229 Keratoconjunctivitis sicca, not specified as Sjogren's, unspecified eye: Secondary | ICD-10-CM | POA: Diagnosis not present

## 2018-03-21 DIAGNOSIS — L570 Actinic keratosis: Secondary | ICD-10-CM | POA: Diagnosis not present

## 2018-03-21 DIAGNOSIS — X32XXXA Exposure to sunlight, initial encounter: Secondary | ICD-10-CM | POA: Diagnosis not present

## 2018-03-21 DIAGNOSIS — D2262 Melanocytic nevi of left upper limb, including shoulder: Secondary | ICD-10-CM | POA: Diagnosis not present

## 2018-03-21 DIAGNOSIS — D225 Melanocytic nevi of trunk: Secondary | ICD-10-CM | POA: Diagnosis not present

## 2018-03-21 DIAGNOSIS — D2261 Melanocytic nevi of right upper limb, including shoulder: Secondary | ICD-10-CM | POA: Diagnosis not present

## 2018-03-21 DIAGNOSIS — D2272 Melanocytic nevi of left lower limb, including hip: Secondary | ICD-10-CM | POA: Diagnosis not present

## 2018-03-21 DIAGNOSIS — Z85828 Personal history of other malignant neoplasm of skin: Secondary | ICD-10-CM | POA: Diagnosis not present

## 2018-03-21 DIAGNOSIS — D2271 Melanocytic nevi of right lower limb, including hip: Secondary | ICD-10-CM | POA: Diagnosis not present

## 2018-03-21 DIAGNOSIS — Z08 Encounter for follow-up examination after completed treatment for malignant neoplasm: Secondary | ICD-10-CM | POA: Diagnosis not present

## 2018-03-21 DIAGNOSIS — Z85820 Personal history of malignant melanoma of skin: Secondary | ICD-10-CM | POA: Diagnosis not present

## 2018-03-28 DIAGNOSIS — M2550 Pain in unspecified joint: Secondary | ICD-10-CM | POA: Diagnosis not present

## 2018-03-28 DIAGNOSIS — G479 Sleep disorder, unspecified: Secondary | ICD-10-CM | POA: Diagnosis not present

## 2018-03-28 DIAGNOSIS — R5383 Other fatigue: Secondary | ICD-10-CM | POA: Diagnosis not present

## 2018-03-30 ENCOUNTER — Encounter: Payer: Self-pay | Admitting: Family Medicine

## 2018-03-30 ENCOUNTER — Ambulatory Visit (INDEPENDENT_AMBULATORY_CARE_PROVIDER_SITE_OTHER): Payer: PPO | Admitting: Family Medicine

## 2018-03-30 VITALS — BP 126/62 | HR 54 | Temp 98.1°F | Resp 16 | Ht 71.0 in | Wt 166.0 lb

## 2018-03-30 DIAGNOSIS — E291 Testicular hypofunction: Secondary | ICD-10-CM | POA: Diagnosis not present

## 2018-03-30 DIAGNOSIS — E782 Mixed hyperlipidemia: Secondary | ICD-10-CM | POA: Diagnosis not present

## 2018-03-30 DIAGNOSIS — I2510 Atherosclerotic heart disease of native coronary artery without angina pectoris: Secondary | ICD-10-CM | POA: Diagnosis not present

## 2018-03-30 DIAGNOSIS — I10 Essential (primary) hypertension: Secondary | ICD-10-CM

## 2018-03-30 DIAGNOSIS — R5383 Other fatigue: Secondary | ICD-10-CM | POA: Diagnosis not present

## 2018-03-30 DIAGNOSIS — G479 Sleep disorder, unspecified: Secondary | ICD-10-CM | POA: Diagnosis not present

## 2018-03-30 NOTE — Progress Notes (Signed)
Patient: Timothy Singleton Male    DOB: 01-May-1946   72 y.o.   MRN: [id number] Visit Date: 03/30/2018  Today's Provider: Wilhemena Durie, MD   Chief Complaint  Patient presents with  . Hypertension   Subjective:    HPI Patient is a 72 year old male that presents today for a follow up on chronic issues. He was last seen in the office 6 months ago.  Hypertension- No changes were made in his medications. He is currently taking Lisinopril 20mg  daily.   Hyperlipidemia- Since last visit, patient reports that he was started back on atorvastatin by his cardiologist. He has been tolerating the medication well.     No Known Allergies   Current Outpatient Medications:  .  aspirin 81 MG tablet, Take 81 mg by mouth daily., Disp: , Rfl:  .  carbamazepine (CARBATROL) 200 MG 12 hr capsule, Take 1 capsule (200 mg total) by mouth 2 (two) times daily., Disp: 60 capsule, Rfl: 12 .  Cimetidine (ACID RELIEF PO), Take by mouth daily., Disp: , Rfl:  .  docusate sodium (COLACE) 100 MG capsule, Take 1 capsule (100 mg total) by mouth 2 (two) times daily., Disp: 60 capsule, Rfl: 0 .  lisinopril (PRINIVIL,ZESTRIL) 20 MG tablet, , Disp: , Rfl:  .  sertraline (ZOLOFT) 100 MG tablet, Take 1 tablet (100 mg total) by mouth daily., Disp: 90 tablet, Rfl: 3 .  sildenafil (REVATIO) 20 MG tablet, 1 to 4 tablets daily as needed, Disp: 50 tablet, Rfl: 11 .  tamsulosin (FLOMAX) 0.4 MG CAPS capsule, TAKE 1 CAPSULE (0.4 MG TOTAL) BY MOUTH DAILY., Disp: 30 capsule, Rfl: 12  Review of Systems  Constitutional: Negative for activity change, appetite change, chills, diaphoresis, fatigue, fever and unexpected weight change.  HENT: Negative.   Eyes: Negative.   Respiratory: Negative for cough, shortness of breath and wheezing.   Cardiovascular: Negative for chest pain and palpitations.  Gastrointestinal: Negative.   Endocrine: Negative for cold intolerance, heat intolerance, polydipsia, polyphagia and polyuria.    Musculoskeletal: Positive for arthralgias.  Skin: Negative.   Allergic/Immunologic: Negative.   Neurological: Negative for dizziness, weakness, light-headedness, numbness and headaches.  Psychiatric/Behavioral: Negative.     Social History   Tobacco Use  . Smoking status: Former Research scientist (life sciences)  . Smokeless tobacco: Former Systems developer  . Tobacco comment: quit 40 years ago   Substance Use Topics  . Alcohol use: Yes    Alcohol/week: 0.0 oz    Comment: 2-4 times per month   Objective:   BP 126/62 (BP Location: Left Arm, Patient Position: Sitting, Cuff Size: Normal)   Pulse (!) 54   Temp 98.1 F (36.7 C)   Resp 16   Ht 5\' 11"  (1.803 m)   Wt 166 lb (75.3 kg)   SpO2 99%   BMI 23.15 kg/m  Vitals:   03/30/18 1129  BP: 126/62  Pulse: (!) 54  Resp: 16  Temp: 98.1 F (36.7 C)  SpO2: 99%  Weight: 166 lb (75.3 kg)  Height: 5\' 11"  (1.803 m)     Physical Exam  Constitutional: He is oriented to person, place, and time. He appears well-developed and well-nourished.  HENT:  Head: Normocephalic and atraumatic.  Right Ear: External ear normal.  Left Ear: External ear normal.  Nose: Nose normal.  Eyes: Conjunctivae are normal. No scleral icterus.  Neck: No thyromegaly present.  Cardiovascular: Normal rate, regular rhythm and normal heart sounds.  Pulmonary/Chest: Effort normal and breath sounds normal.  Abdominal: Soft.  Musculoskeletal: He exhibits no edema.  Neurological: He is alert and oriented to person, place, and time.  Skin: Skin is warm and dry.  Psychiatric: He has a normal mood and affect. His behavior is normal. Judgment and thought content normal.        Assessment & Plan:     CAD HTN HLD 6 month physical.      Wilhemena Durie, MD  [facility]

## 2018-04-18 ENCOUNTER — Telehealth: Payer: Self-pay | Admitting: Family Medicine

## 2018-04-18 NOTE — Telephone Encounter (Signed)
I left a message asking the pt to call and schedule AWV. VDM (DD) °

## 2018-05-12 DIAGNOSIS — C44722 Squamous cell carcinoma of skin of right lower limb, including hip: Secondary | ICD-10-CM | POA: Diagnosis not present

## 2018-05-12 DIAGNOSIS — D485 Neoplasm of uncertain behavior of skin: Secondary | ICD-10-CM | POA: Diagnosis not present

## 2018-05-12 NOTE — Telephone Encounter (Signed)
I left a message asking the pt to call me at [phone] to schedule AWV w/ [facility]. VDM (DD) °

## 2018-05-31 DIAGNOSIS — I10 Essential (primary) hypertension: Secondary | ICD-10-CM | POA: Diagnosis not present

## 2018-05-31 DIAGNOSIS — E782 Mixed hyperlipidemia: Secondary | ICD-10-CM | POA: Diagnosis not present

## 2018-05-31 DIAGNOSIS — I2510 Atherosclerotic heart disease of native coronary artery without angina pectoris: Secondary | ICD-10-CM | POA: Diagnosis not present

## 2018-05-31 DIAGNOSIS — I6523 Occlusion and stenosis of bilateral carotid arteries: Secondary | ICD-10-CM | POA: Diagnosis not present

## 2018-05-31 DIAGNOSIS — G459 Transient cerebral ischemic attack, unspecified: Secondary | ICD-10-CM | POA: Diagnosis not present

## 2018-07-01 ENCOUNTER — Other Ambulatory Visit: Payer: PPO

## 2018-07-01 DIAGNOSIS — R972 Elevated prostate specific antigen [PSA]: Secondary | ICD-10-CM | POA: Diagnosis not present

## 2018-07-02 LAB — PSA: Prostate Specific Ag, Serum: 2.7 ng/mL (ref 0.0–4.0)

## 2018-07-05 ENCOUNTER — Encounter: Payer: Self-pay | Admitting: Urology

## 2018-07-05 ENCOUNTER — Ambulatory Visit: Payer: PPO | Admitting: Urology

## 2018-07-05 VITALS — BP 129/69 | HR 60 | Ht 71.0 in | Wt 165.0 lb

## 2018-07-05 DIAGNOSIS — I10 Essential (primary) hypertension: Secondary | ICD-10-CM | POA: Diagnosis not present

## 2018-07-05 DIAGNOSIS — Z8673 Personal history of transient ischemic attack (TIA), and cerebral infarction without residual deficits: Secondary | ICD-10-CM | POA: Diagnosis not present

## 2018-07-05 DIAGNOSIS — N200 Calculus of kidney: Secondary | ICD-10-CM | POA: Diagnosis not present

## 2018-07-05 DIAGNOSIS — R972 Elevated prostate specific antigen [PSA]: Secondary | ICD-10-CM

## 2018-07-05 DIAGNOSIS — E291 Testicular hypofunction: Secondary | ICD-10-CM

## 2018-07-05 DIAGNOSIS — G459 Transient cerebral ischemic attack, unspecified: Secondary | ICD-10-CM | POA: Diagnosis not present

## 2018-07-05 DIAGNOSIS — I6523 Occlusion and stenosis of bilateral carotid arteries: Secondary | ICD-10-CM | POA: Diagnosis not present

## 2018-07-05 NOTE — Progress Notes (Signed)
07/05/2018 6:39 PM   Timothy Singleton 03-11-1946 [id number]  Referring provider: Jerrol Banana., MD [address] [place], [place] [address]  Chief Complaint  Patient presents with  . Elevated PSA    6 month    HPI: 72 year old male with a history of nephrolithiasis, hypogonadism and rising PSA who presents today for routine follow-up.  He has a  personal history of nephrolithiasis.  Most recently, he is s/p ESWL for a 15 mm right nonobstructing calculus on 12/30/17. Follow u p demonstrated fragmentation of the stone with a dominant 7 millimeter fragment on the right and punctate stone on the left.  Since last visit, he is done very well.  He denies any flank pain, gross hematuria, or any other signs or symptoms of stones.  He has no urinary complaints today.  He is under treatment for hypogonadism managed by a private clinic, [facility] which is not covered by his insurance.  This is improved his sex life dramatically as well as his energy level.  He is using Testopel pellets with last implant a few months ago.   He may be interested in transferring his care to [place] for this.  He did obtain records for [place] with previous testosterone values but none of which appeared to be his initial baseline testosterone level.  This is unchanged from last visit.  He is pleased with the results of initiation of this therapy.  Notably, after starting Testopel, his PSA has gone from 1.6 a year ago to most recently 2.7 6 months ago.  His PSA was repeated on 07/01/2018 and is stabilized at 2.7.  He denies any urinary complaints today.  This is unchanged.    PMH: Past Medical History:  Diagnosis Date  . Arthritis   . Cancer ([place])    melanoma / knee  . Depression   . GERD (gastroesophageal reflux disease)   . Hyperlipemia   . Hypertension   . Past heart attack   . Sleep apnea     Surgical History: Past Surgical History:  Procedure Laterality Date  . BUNIONECTOMY    .  COLONOSCOPY WITH PROPOFOL N/A 11/17/2016   Procedure: COLONOSCOPY WITH PROPOFOL;  Surgeon: Jonathon Bellows, MD;  Location: [facility] ENDOSCOPY;  Service: Endoscopy;  Laterality: N/A;  . CORONARY STENT PLACEMENT    . EXTRACORPOREAL SHOCK WAVE LITHOTRIPSY    . EXTRACORPOREAL SHOCK WAVE LITHOTRIPSY Right 12/30/2017   Procedure: EXTRACORPOREAL SHOCK WAVE LITHOTRIPSY (ESWL);  Surgeon: Hollice Espy, MD;  Location: [facility] ORS;  Service: Urology;  Laterality: Right;  . HERNIA REPAIR     inguinal-right  . JOINT REPLACEMENT     total shoulder replacement  . KNEE SURGERY Right   . TONSILLECTOMY    . TOTAL SHOULDER REPLACEMENT    . UPPER GI ENDOSCOPY  10/18/01   hiatus hernia  . VASECTOMY    . WRIST SURGERY      Home Medications:  Allergies as of 07/05/2018   No Known Allergies     Medication List        Accurate as of 07/05/18  6:39 PM. Always use your most recent med list.          ACID RELIEF PO Take by mouth daily.   aspirin 81 MG tablet Take 81 mg by mouth daily.   carbamazepine 200 MG 12 hr capsule Commonly known as:  CARBATROL Take 1 capsule (200 mg total) by mouth 2 (two) times daily.   lisinopril 20 MG tablet Commonly  known as:  PRINIVIL,ZESTRIL   sertraline 100 MG tablet Commonly known as:  ZOLOFT Take 1 tablet (100 mg total) by mouth daily.   sildenafil 20 MG tablet Commonly known as:  REVATIO 1 to 4 tablets daily as needed   tamsulosin 0.4 MG Caps capsule Commonly known as:  FLOMAX TAKE 1 CAPSULE (0.4 MG TOTAL) BY MOUTH DAILY.       Allergies: No Known Allergies  Family History: Family History  Problem Relation Age of Onset  . Cancer Mother   . Dementia Mother   . Stroke Father   . Heart disease Father   . Hypertension Father   . Breast cancer Sister   . Parkinson's disease Brother   . Hypertension Brother   . Melanoma Maternal Grandmother   . Bladder Cancer Neg Hx   . Prostate cancer Neg Hx   . Kidney cancer Neg Hx     Social History:  reports that  he has quit smoking. He has quit using smokeless tobacco. He reports that he drinks alcohol. He reports that he does not use drugs.  ROS: UROLOGY Frequent Urination?: No Hard to postpone urination?: No Burning/pain with urination?: No Get up at night to urinate?: Yes Leakage of urine?: No Urine stream starts and stops?: No Trouble starting stream?: No Do you have to strain to urinate?: No Blood in urine?: No Urinary tract infection?: No Sexually transmitted disease?: No Injury to kidneys or bladder?: No Painful intercourse?: No Weak stream?: No Erection problems?: No Penile pain?: No  Gastrointestinal Nausea?: No Vomiting?: No Indigestion/heartburn?: No Diarrhea?: No Constipation?: No  Constitutional Fever: No Night sweats?: No Weight loss?: No Fatigue?: No  Skin Skin rash/lesions?: No Itching?: No  Eyes Blurred vision?: No Double vision?: No  Ears/Nose/Throat Sore throat?: No Sinus problems?: No  Hematologic/Lymphatic Swollen glands?: No Easy bruising?: Yes  Cardiovascular Leg swelling?: No Chest pain?: No  Respiratory Cough?: No Shortness of breath?: No  Endocrine Excessive thirst?: No  Musculoskeletal Back pain?: No Joint pain?: No  Neurological Headaches?: No Dizziness?: No  Psychologic Depression?: No Anxiety?: No  Physical Exam: BP 129/69   Pulse 60   Ht 5\' 11"  (1.803 m)   Wt 165 lb (74.8 kg)   BMI 23.01 kg/m   Constitutional:  Alert and oriented, No acute distress. HEENT: [place] AT, moist mucus membranes.  Trachea midline, no masses. Cardiovascular: No clubbing, cyanosis, or edema. Respiratory: Normal respiratory effort, no increased work of breathing. Skin: No rashes, bruises or suspicious lesions. Neurologic: Grossly intact, no focal deficits, moving all 4 extremities. Psychiatric: Normal mood and affect.  Laboratory Data: Lab Results  Component Value Date   WBC 5.9 10/04/2017   HGB 16.1 10/04/2017   HCT 48.1 10/04/2017    MCV 87.9 10/04/2017   PLT 224 10/04/2017    Lab Results  Component Value Date   CREATININE 1.18 10/04/2017   PSA as above  Lab Results  Component Value Date   HGBA1C 5.1 11/02/2016    Urinalysis N/a  Pertinent Imaging: No new interval imaging  Assessment & Plan:    1. Kidney stones Continue conservative management, not interested in any further surgical procedures at this time Plan for KUB in 1 year Reviewed stone diet recommendations - DG Abd 1 View; Future  2. Hypogonadism in male Managed by private clinic, cash pay It is unclear whether or not he was truly hypogonadal at the time of initiation We discussed the risks of testosterone therapy at length today including potential cardiovascular, polycythemia,  and malignant potential I continue to encourage him to try to obtain records to prove that he was truly hypogonadal with initiation of treatment, then will feel comfortable continuing treatment under my care  3. Rising PSA level Initial bump in PSA after initiation of testosterone therapy by outside clinic PSA appears to have stabilized over the past 6 months We will again in 1 year   Return in about 1 year (around 07/06/2019) for PSA, KUB.  Hollice Espy, MD  [facility] [address] [place], [place] [address] [phone]

## 2018-07-22 ENCOUNTER — Encounter (INDEPENDENT_AMBULATORY_CARE_PROVIDER_SITE_OTHER): Payer: Self-pay | Admitting: Vascular Surgery

## 2018-07-22 ENCOUNTER — Ambulatory Visit (INDEPENDENT_AMBULATORY_CARE_PROVIDER_SITE_OTHER): Payer: PPO | Admitting: Vascular Surgery

## 2018-07-22 VITALS — BP 143/81 | HR 58 | Resp 16 | Ht 71.0 in | Wt 168.0 lb

## 2018-07-22 DIAGNOSIS — I2510 Atherosclerotic heart disease of native coronary artery without angina pectoris: Secondary | ICD-10-CM | POA: Diagnosis not present

## 2018-07-22 DIAGNOSIS — I6523 Occlusion and stenosis of bilateral carotid arteries: Secondary | ICD-10-CM

## 2018-07-22 DIAGNOSIS — I10 Essential (primary) hypertension: Secondary | ICD-10-CM

## 2018-07-22 DIAGNOSIS — I6529 Occlusion and stenosis of unspecified carotid artery: Secondary | ICD-10-CM | POA: Insufficient documentation

## 2018-07-22 DIAGNOSIS — Z87891 Personal history of nicotine dependence: Secondary | ICD-10-CM

## 2018-07-22 DIAGNOSIS — E782 Mixed hyperlipidemia: Secondary | ICD-10-CM

## 2018-07-22 DIAGNOSIS — G459 Transient cerebral ischemic attack, unspecified: Secondary | ICD-10-CM

## 2018-07-22 NOTE — Assessment & Plan Note (Signed)
If we end up doing surgery, we will ask his cardiologist to assess his surgical risk.

## 2018-07-22 NOTE — Progress Notes (Signed)
Patient ID: Timothy Singleton, male   DOB: 31-Oct-1945, 72 y.o.   MRN: [id number]  Chief Complaint  Patient presents with  . New Patient (Initial Visit)    Carotid stenosis    HPI Timothy Singleton is a 72 y.o. male.  I am asked to see the patient by Dr. Nehemiah Massed for evaluation of carotid disease.  The patient reports at least 2 episodes of documented a aphasia.  This resolved within several minutes.  They came on without inciting events or causative factors.  This is happened within the last 6 months to a year.  He is also had several episodes where he has had significant dizziness and near syncopal episodes.  No arm or leg weakness or numbness.  No visual symptoms.  He has a long history of heart disease and follows with a cardiologist.  His cardiologist ordered a carotid duplex which I have reviewed.  The velocities suggest a 50 to 69% right carotid artery stenosis and a greater than 70% left carotid artery stenosis by their velocity criteria.  As such, he is referred for further evaluation and treatment.   Past Medical History:  Diagnosis Date  . Arthritis   . Cancer ([place])    melanoma / knee  . Depression   . GERD (gastroesophageal reflux disease)   . Hyperlipemia   . Hypertension   . Past heart attack   . Sleep apnea     Past Surgical History:  Procedure Laterality Date  . BUNIONECTOMY    . COLONOSCOPY WITH PROPOFOL N/A 11/17/2016   Procedure: COLONOSCOPY WITH PROPOFOL;  Surgeon: Jonathon Bellows, MD;  Location: [facility];  Service: Endoscopy;  Laterality: N/A;  . CORONARY STENT PLACEMENT    . EXTRACORPOREAL SHOCK WAVE LITHOTRIPSY    . EXTRACORPOREAL SHOCK WAVE LITHOTRIPSY Right 12/30/2017   Procedure: EXTRACORPOREAL SHOCK WAVE LITHOTRIPSY (ESWL);  Surgeon: Hollice Espy, MD;  Location: [facility] ORS;  Service: Urology;  Laterality: Right;  . HERNIA REPAIR     inguinal-right  . JOINT REPLACEMENT     total shoulder replacement  . KNEE SURGERY Right   . TONSILLECTOMY      . TOTAL SHOULDER REPLACEMENT    . UPPER GI ENDOSCOPY  10/18/01   hiatus hernia  . VASECTOMY    . WRIST SURGERY      Family History  Problem Relation Age of Onset  . Cancer Mother   . Dementia Mother   . Stroke Father   . Heart disease Father   . Hypertension Father   . Breast cancer Sister   . Parkinson's disease Brother   . Hypertension Brother   . Melanoma Maternal Grandmother   . Bladder Cancer Neg Hx   . Prostate cancer Neg Hx   . Kidney cancer Neg Hx      Social History Social History   Tobacco Use  . Smoking status: Former Research scientist (life sciences)  . Smokeless tobacco: Former Systems developer  . Tobacco comment: quit 40 years ago   Substance Use Topics  . Alcohol use: Yes    Alcohol/week: 0.0 standard drinks    Comment: 2-4 times per month  . Drug use: No     No Known Allergies  Current Outpatient Medications  Medication Sig Dispense Refill  . aspirin 81 MG tablet Take 81 mg by mouth daily.    Marland Kitchen atorvastatin (LIPITOR) 80 MG tablet Take by mouth.    . clopidogrel (PLAVIX) 75 MG tablet Take by mouth.    Marland Kitchen lisinopril (PRINIVIL,ZESTRIL) 20 MG  tablet     . sertraline (ZOLOFT) 100 MG tablet Take 1 tablet (100 mg total) by mouth daily. 90 tablet 3  . sildenafil (REVATIO) 20 MG tablet 1 to 4 tablets daily as needed 50 tablet 11  . tamsulosin (FLOMAX) 0.4 MG CAPS capsule TAKE 1 CAPSULE (0.4 MG TOTAL) BY MOUTH DAILY. 30 capsule 12  . amoxicillin (AMOXIL) 500 MG tablet      No current facility-administered medications for this visit.       REVIEW OF SYSTEMS (Negative unless checked)  Constitutional: [] Weight loss  [] Fever  [] Chills Cardiac: [] Chest pain   [] Chest pressure   [] Palpitations   [] Shortness of breath when laying flat   [] Shortness of breath at rest   [x] Shortness of breath with exertion. Vascular:  [] Pain in legs with walking   [] Pain in legs at rest   [] Pain in legs when laying flat   [] Claudication   [] Pain in feet when walking  [] Pain in feet at rest  [] Pain in feet when  laying flat   [] History of DVT   [] Phlebitis   [] Swelling in legs   [] Varicose veins   [] Non-healing ulcers Pulmonary:   [] Uses home oxygen   [] Productive cough   [] Hemoptysis   [] Wheeze  [] COPD   [] Asthma Neurologic:  [x] Dizziness  [] Blackouts   [] Seizures   [] History of stroke   [] History of TIA  [x] Aphasia   [] Temporary blindness   [] Dysphagia   [] Weakness or numbness in arms   [] Weakness or numbness in legs Musculoskeletal:  [x] Arthritis   [] Joint swelling   [] Joint pain   [] Low back pain Hematologic:  [] Easy bruising  [] Easy bleeding   [] Hypercoagulable state   [] Anemic  [] Hepatitis Gastrointestinal:  [] Blood in stool   [] Vomiting blood  [x] Gastroesophageal reflux/heartburn   [] Abdominal pain Genitourinary:  [] Chronic kidney disease   [] Difficult urination  [] Frequent urination  [] Burning with urination   [] Hematuria Skin:  [] Rashes   [] Ulcers   [] Wounds Psychological:  [] History of anxiety   []  History of major depression.    Physical Exam BP (!) 143/81 (BP Location: Right Arm, Patient Position: Sitting)   Pulse (!) 58   Resp 16   Ht 5\' 11"  (1.803 m)   Wt 168 lb (76.2 kg)   BMI 23.43 kg/m  Gen:  WD/WN, NAD, appears younger than stated age Head: [place]/AT, No temporalis wasting.  Ear/Nose/Throat: Hearing grossly intact, nares w/o erythema or drainage, oropharynx w/o Erythema/Exudate Eyes: Conjunctiva clear, sclera non-icteric  Neck: trachea midline.  Left carotid bruit Pulmonary:  Good air movement, clear to auscultation bilaterally.  Cardiac: RRR, no JVD Vascular:  Vessel Right Left  Radial Palpable Palpable                          PT Palpable  1+ palpable  DP Palpable Palpable   Gastrointestinal: soft, non-tender/non-distended.  Musculoskeletal: M/S 5/5 throughout.  Extremities without ischemic changes.  No deformity or atrophy.  No edema. Neurologic: Sensation grossly intact in extremities.  Symmetrical.  Speech is fluent. Motor exam as listed above. Psychiatric:  Judgment intact, Mood & affect appropriate for pt's clinical situation. Dermatologic: No rashes or ulcers noted.  No cellulitis or open wounds.   Radiology No results found.  Labs Recent Results (from the past 2160 hour(s))  PSA     Status: None   Collection Time: 07/01/18  9:12 AM  Result Value Ref Range   Prostate Specific Ag, Serum 2.7 0.0 - 4.0 ng/mL  Comment: Roche ECLIA methodology. According to the American Urological Association, Serum PSA should decrease and remain at undetectable levels after radical prostatectomy. The AUA defines biochemical recurrence as an initial PSA value 0.2 ng/mL or greater followed by a subsequent confirmatory PSA value 0.2 ng/mL or greater. Values obtained with different assay methods or kits cannot be used interchangeably. Results cannot be interpreted as absolute evidence of the presence or absence of malignant disease.     Assessment/Plan:  Essential (primary) hypertension blood pressure control important in reducing the progression of atherosclerotic disease. On appropriate oral medications.   Combined fat and carbohydrate induced hyperlipemia lipid control important in reducing the progression of atherosclerotic disease. Continue statin therapy   CAD in native artery If we end up doing surgery, we will ask his cardiologist to assess his surgical risk.  TIA (transient ischemic attack) Given the left-sided lesion being more severe and his symptoms being a aphasia, this is likely asymptomatic carotid lesion creating the TIA symptoms.  Carotid stenosis carotid duplex which I have reviewed.  The velocities suggest a 50 to 69% right carotid artery stenosis and a greater than 70% left carotid artery stenosis by their velocity criteria. Given the left-sided lesion being more severe and his symptoms being a aphasia, this is likely asymptomatic carotid lesion creating the TIA symptoms.  The patient has symptoms worrisome for asymptomatic  carotid lesion.  The patient has now progressed and has a lesion the is >70%.  Patient should undergo CT angiography of the carotid arteries to define the degree of stenosis of the internal carotid arteries bilaterally and the anatomic suitability for surgery vs. intervention.  If the patient does indeed need surgery cardiac clearance will be required, once cleared the patient will be scheduled for surgery.  The risks, benefits and alternative therapies were reviewed in detail with the patient.  All questions were answered.  The patient agrees to proceed with imaging.  Continue antiplatelet therapy as prescribed. Continue management of CAD, HTN and Hyperlipidemia. Healthy heart diet, encouraged exercise at least 4 times per week.         Leotis Pain 07/22/2018, 9:44 AM   This note was created with Dragon medical transcription system.  Any errors from dictation are unintentional.

## 2018-07-22 NOTE — Assessment & Plan Note (Signed)
blood pressure control important in reducing the progression of atherosclerotic disease. On appropriate oral medications.  

## 2018-07-22 NOTE — Assessment & Plan Note (Signed)
lipid control important in reducing the progression of atherosclerotic disease. Continue statin therapy  

## 2018-07-22 NOTE — Patient Instructions (Signed)
Carotid Artery Disease The carotid arteries are arteries on both sides of the neck. They carry blood to the brain. Carotid artery disease is when the arteries get smaller (narrow) or get blocked. If these arteries get smaller or get blocked, you are more likely to have a stroke or warning stroke (transient ischemic attack). Follow these instructions at home:  Take medicines as told by your doctor. Make sure you understand all your medicine instructions. Do not stop your medicines without talking to your doctor first.  Follow your doctor's diet instructions. It is important to eat a healthy diet that includes plenty of: ? Fresh fruits. ? Vegetables. ? Lean meats.  Avoid: ? High-fat foods. ? High-sodium foods. ? Foods that are fried, overly processed, or have poor nutritional value.  Stay a healthy weight.  Stay active. Get at least 30 minutes of activity every day.  Do not smoke.  Limit alcohol use to: ? No more than 2 drinks a day for men. ? No more than 1 drink a day for women who are not pregnant.  Do not use illegal drugs.  Keep all doctor visits as told. Get help right away if:  You have sudden weakness or loss of feeling (numbness) on one side of the body, such as the face, arm, or leg.  You have sudden confusion.  You have trouble speaking (aphasia) or understanding.  You have sudden trouble seeing out of one or both eyes.  You have sudden trouble walking.  You have dizziness or feel like you might pass out (faint).  You have a loss of balance or your movements are not steady (uncoordinated).  You have a sudden, severe headache with no known cause.  You have trouble swallowing (dysphagia). Call your local emergency services (911 in [place] Do notdrive yourself to the clinic or hospital. This information is not intended to replace advice given to you by your health care provider. Make sure you discuss any questions you have with your health care  provider. Document Released: 09/21/2012 Document Revised: 03/12/2016 Document Reviewed: 04/05/2013 Elsevier Interactive Patient Education  2018 Elsevier Inc.  

## 2018-07-22 NOTE — Assessment & Plan Note (Signed)
Given the left-sided lesion being more severe and his symptoms being a aphasia, this is likely asymptomatic carotid lesion creating the TIA symptoms.

## 2018-07-22 NOTE — Assessment & Plan Note (Addendum)
carotid duplex which I have reviewed.  The velocities suggest a 50 to 69% right carotid artery stenosis and a greater than 70% left carotid artery stenosis by their velocity criteria. Given the left-sided lesion being more severe and his symptoms being a aphasia, this is likely asymptomatic carotid lesion creating the TIA symptoms.  The patient has symptoms worrisome for asymptomatic carotid lesion.  The patient has now progressed and has a lesion the is >70%.  Patient should undergo CT angiography of the carotid arteries to define the degree of stenosis of the internal carotid arteries bilaterally and the anatomic suitability for surgery vs. intervention.  If the patient does indeed need surgery cardiac clearance will be required, once cleared the patient will be scheduled for surgery.  The risks, benefits and alternative therapies were reviewed in detail with the patient.  All questions were answered.  The patient agrees to proceed with imaging.  Continue antiplatelet therapy as prescribed. Continue management of CAD, HTN and Hyperlipidemia. Healthy heart diet, encouraged exercise at least 4 times per week.

## 2018-07-27 ENCOUNTER — Telehealth (INDEPENDENT_AMBULATORY_CARE_PROVIDER_SITE_OTHER): Payer: Self-pay | Admitting: Vascular Surgery

## 2018-07-27 NOTE — Telephone Encounter (Signed)
I left a message informing the patient the orders were placed and if labs need to be done that he will be instructed on what time he needs to arrive,also if he has anymore questions he can call the office.

## 2018-07-28 ENCOUNTER — Other Ambulatory Visit (INDEPENDENT_AMBULATORY_CARE_PROVIDER_SITE_OTHER): Payer: Self-pay | Admitting: Vascular Surgery

## 2018-07-28 DIAGNOSIS — I6523 Occlusion and stenosis of bilateral carotid arteries: Secondary | ICD-10-CM

## 2018-08-03 ENCOUNTER — Ambulatory Visit
Admission: RE | Admit: 2018-08-03 | Discharge: 2018-08-03 | Disposition: A | Discharge status: Home / Self Care | Payer: PPO | Source: Ambulatory Visit | Attending: Vascular Surgery | Admitting: Vascular Surgery

## 2018-08-03 DIAGNOSIS — I700 Atherosclerosis of aorta: Secondary | ICD-10-CM | POA: Diagnosis not present

## 2018-08-03 DIAGNOSIS — I6523 Occlusion and stenosis of bilateral carotid arteries: Secondary | ICD-10-CM | POA: Diagnosis not present

## 2018-08-03 LAB — POCT I-STAT CREATININE: CREATININE: 1.3 mg/dL — AB (ref 0.61–1.24)

## 2018-08-03 MED ORDER — IOPAMIDOL (ISOVUE-370) INJECTION 76%
75.0000 mL | Freq: Once | INTRAVENOUS | Status: AC | PRN
  Administered 2018-08-03: 75 mL via INTRAVENOUS

## 2018-08-15 DIAGNOSIS — R208 Other disturbances of skin sensation: Secondary | ICD-10-CM | POA: Diagnosis not present

## 2018-08-15 DIAGNOSIS — C44722 Squamous cell carcinoma of skin of right lower limb, including hip: Secondary | ICD-10-CM | POA: Diagnosis not present

## 2018-08-15 DIAGNOSIS — C44622 Squamous cell carcinoma of skin of right upper limb, including shoulder: Secondary | ICD-10-CM | POA: Diagnosis not present

## 2018-08-15 DIAGNOSIS — D485 Neoplasm of uncertain behavior of skin: Secondary | ICD-10-CM | POA: Diagnosis not present

## 2018-08-16 ENCOUNTER — Ambulatory Visit (INDEPENDENT_AMBULATORY_CARE_PROVIDER_SITE_OTHER): Payer: PPO | Admitting: Vascular Surgery

## 2018-08-16 ENCOUNTER — Encounter (INDEPENDENT_AMBULATORY_CARE_PROVIDER_SITE_OTHER): Payer: Self-pay | Admitting: Vascular Surgery

## 2018-08-16 VITALS — BP 128/75 | HR 66 | Resp 17 | Ht 71.0 in | Wt 165.0 lb

## 2018-08-16 DIAGNOSIS — I2510 Atherosclerotic heart disease of native coronary artery without angina pectoris: Secondary | ICD-10-CM | POA: Diagnosis not present

## 2018-08-16 DIAGNOSIS — E782 Mixed hyperlipidemia: Secondary | ICD-10-CM | POA: Diagnosis not present

## 2018-08-16 DIAGNOSIS — I10 Essential (primary) hypertension: Secondary | ICD-10-CM | POA: Diagnosis not present

## 2018-08-16 DIAGNOSIS — Z87891 Personal history of nicotine dependence: Secondary | ICD-10-CM | POA: Diagnosis not present

## 2018-08-16 DIAGNOSIS — I6523 Occlusion and stenosis of bilateral carotid arteries: Secondary | ICD-10-CM | POA: Diagnosis not present

## 2018-08-16 NOTE — Progress Notes (Signed)
MRN : [id number]  Timothy Singleton is a 72 y.o. (1946/09/03) male who presents with chief complaint of  Chief Complaint  Patient presents with  . Follow-up    CT reults  .  History of Present Illness: Patient returns today in follow up of his carotid disease.  No new symptoms since his last visit.  He does have 2 previous episodes that sound like TIAs.  He has undergone a CT angiogram which I have independently reviewed.  His CT angiogram demonstrates far less stenosis that had been suggested by the previous ultrasound.  His right carotid artery had mild atherosclerotic plaque without any significant stenosis.  His left common carotid artery did have a fair bit of softer plaque, but the degree of stenosis was in the 50 to 55% range.  Current Outpatient Medications  Medication Sig Dispense Refill  . amoxicillin (AMOXIL) 500 MG tablet     . aspirin 81 MG tablet Take 81 mg by mouth daily.    Marland Kitchen atorvastatin (LIPITOR) 80 MG tablet Take by mouth.    . clopidogrel (PLAVIX) 75 MG tablet Take by mouth.    Marland Kitchen lisinopril (PRINIVIL,ZESTRIL) 20 MG tablet     . sertraline (ZOLOFT) 100 MG tablet Take 1 tablet (100 mg total) by mouth daily. 90 tablet 3  . sildenafil (REVATIO) 20 MG tablet 1 to 4 tablets daily as needed 50 tablet 11  . tamsulosin (FLOMAX) 0.4 MG CAPS capsule TAKE 1 CAPSULE (0.4 MG TOTAL) BY MOUTH DAILY. 30 capsule 12   No current facility-administered medications for this visit.     Past Medical History:  Diagnosis Date  . Arthritis   . Cancer ([place])    melanoma / knee  . Depression   . GERD (gastroesophageal reflux disease)   . Hyperlipemia   . Hypertension   . Past heart attack   . Sleep apnea     Past Surgical History:  Procedure Laterality Date  . BUNIONECTOMY    . COLONOSCOPY WITH PROPOFOL N/A 11/17/2016   Procedure: COLONOSCOPY WITH PROPOFOL;  Surgeon: Jonathon Bellows, MD;  Location: [facility];  Service: Endoscopy;  Laterality: N/A;  . CORONARY STENT PLACEMENT      . EXTRACORPOREAL SHOCK WAVE LITHOTRIPSY    . EXTRACORPOREAL SHOCK WAVE LITHOTRIPSY Right 12/30/2017   Procedure: EXTRACORPOREAL SHOCK WAVE LITHOTRIPSY (ESWL);  Surgeon: Hollice Espy, MD;  Location: [facility] ORS;  Service: Urology;  Laterality: Right;  . HERNIA REPAIR     inguinal-right  . JOINT REPLACEMENT     total shoulder replacement  . KNEE SURGERY Right   . TONSILLECTOMY    . TOTAL SHOULDER REPLACEMENT    . UPPER GI ENDOSCOPY  10/18/01   hiatus hernia  . VASECTOMY    . WRIST SURGERY      Social History        Tobacco Use  . Smoking status: Former Research scientist (life sciences)  . Smokeless tobacco: Former Systems developer  . Tobacco comment: quit 40 years ago   Substance Use Topics  . Alcohol use: Yes    Alcohol/week: 0.0 standard drinks    Comment: 2-4 times per month  . Drug use: No     No Known Allergies        Current Outpatient Medications  Medication Sig Dispense Refill  . aspirin 81 MG tablet Take 81 mg by mouth daily.    Marland Kitchen atorvastatin (LIPITOR) 80 MG tablet Take by mouth.    . clopidogrel (PLAVIX) 75 MG tablet Take by mouth.    Marland Kitchen  lisinopril (PRINIVIL,ZESTRIL) 20 MG tablet     . sertraline (ZOLOFT) 100 MG tablet Take 1 tablet (100 mg total) by mouth daily. 90 tablet 3  . sildenafil (REVATIO) 20 MG tablet 1 to 4 tablets daily as needed 50 tablet 11  . tamsulosin (FLOMAX) 0.4 MG CAPS capsule TAKE 1 CAPSULE (0.4 MG TOTAL) BY MOUTH DAILY. 30 capsule 12  . amoxicillin (AMOXIL) 500 MG tablet      No current facility-administered medications for this visit.       REVIEW OF SYSTEMS (Negative unless checked)  Constitutional: [] Weight loss  [] Fever  [] Chills Cardiac: [] Chest pain   [] Chest pressure   [] Palpitations   [] Shortness of breath when laying flat   [] Shortness of breath at rest   [x] Shortness of breath with exertion. Vascular:  [] Pain in legs with walking   [] Pain in legs at rest   [] Pain in legs when laying flat   [] Claudication   [] Pain in feet when walking   [] Pain in feet at rest  [] Pain in feet when laying flat   [] History of DVT   [] Phlebitis   [] Swelling in legs   [] Varicose veins   [] Non-healing ulcers Pulmonary:   [] Uses home oxygen   [] Productive cough   [] Hemoptysis   [] Wheeze  [] COPD   [] Asthma Neurologic:  [x] Dizziness  [] Blackouts   [] Seizures   [] History of stroke   [] History of TIA  [x] Aphasia   [] Temporary blindness   [] Dysphagia   [] Weakness or numbness in arms   [] Weakness or numbness in legs Musculoskeletal:  [x] Arthritis   [] Joint swelling   [] Joint pain   [] Low back pain Hematologic:  [] Easy bruising  [] Easy bleeding   [] Hypercoagulable state   [] Anemic  [] Hepatitis Gastrointestinal:  [] Blood in stool   [] Vomiting blood  [x] Gastroesophageal reflux/heartburn   [] Abdominal pain Genitourinary:  [] Chronic kidney disease   [] Difficult urination  [] Frequent urination  [] Burning with urination   [] Hematuria Skin:  [] Rashes   [] Ulcers   [] Wounds Psychological:  [] History of anxiety   []  History of major depression.    Physical Examination  BP 128/75 (BP Location: Right Arm, Patient Position: Sitting)   Pulse 66   Resp 17   Ht 5\' 11"  (1.803 m)   Wt 165 lb (74.8 kg)   BMI 23.01 kg/m  Gen:  WD/WN, NAD Head: [place]/AT, No temporalis wasting. Ear/Nose/Throat: Hearing grossly intact, nares w/o erythema or drainage Eyes: Conjunctiva clear. Sclera non-icteric Neck: Supple.  Trachea midline Pulmonary:  Good air movement, no use of accessory muscles.  Cardiac: RRR, no JVD Vascular:  Vessel Right Left  Radial Palpable Palpable                                    Musculoskeletal: M/S 5/5 throughout.  No deformity or atrophy. Neurologic: Sensation grossly intact in extremities.  Symmetrical.  Speech is fluent.  Psychiatric: Judgment intact, Mood & affect appropriate for pt's clinical situation. Dermatologic: No rashes or ulcers noted.  No cellulitis or open wounds.       Labs Recent Results (from the past 2160 hour(s))  PSA      Status: None   Collection Time: 07/01/18  9:12 AM  Result Value Ref Range   Prostate Specific Ag, Serum 2.7 0.0 - 4.0 ng/mL    Comment: Roche ECLIA methodology. According to the American Urological Association, Serum PSA should decrease and remain at undetectable levels after radical prostatectomy. The  AUA defines biochemical recurrence as an initial PSA value 0.2 ng/mL or greater followed by a subsequent confirmatory PSA value 0.2 ng/mL or greater. Values obtained with different assay methods or kits cannot be used interchangeably. Results cannot be interpreted as absolute evidence of the presence or absence of malignant disease.   I-STAT creatinine     Status: Abnormal   Collection Time: 08/03/18  9:42 AM  Result Value Ref Range   Creatinine, Ser 1.30 (H) 0.61 - 1.24 mg/dL    Radiology Ct Angio Neck W/cm &/or Wo/cm  Result Date: 08/03/2018 CLINICAL DATA:  Carotid stenosis. Recent TIA. Dizziness. EXAM: CT ANGIOGRAPHY NECK TECHNIQUE: Multidetector CT imaging of the neck was performed using the standard protocol during bolus administration of intravenous contrast. Multiplanar CT image reconstructions and MIPs were obtained to evaluate the vascular anatomy. Carotid stenosis measurements (when applicable) are obtained utilizing NASCET criteria, using the distal internal carotid diameter as the denominator. CONTRAST:  22mL ISOVUE-370 IOPAMIDOL (ISOVUE-370) INJECTION 76% COMPARISON:  Carotid Doppler ultrasound 07/05/2018. Neck CTA 02/01/2013. FINDINGS: Aortic arch: Standard 3 vessel aortic arch with mild atherosclerotic plaque. Increased plaque in both subclavian arteries compared to the prior CTA resulting in prominent luminal irregularity as well as a 50% left subclavian artery stenosis proximal to the vertebral origin. Right carotid system: Predominantly soft plaque throughout the proximal to mid common carotid artery without significant stenosis. Calcified plaque at the carotid bifurcation  and in the proximal ICA has increased from the prior CTA but does not result in significant stenosis. Left carotid system: Mixed calcified and soft plaque throughout the mid common carotid artery has increased from the prior CTA and results in 55% stenosis. Mildly increased calcified plaque in the proximal ICA results in less than 50% stenosis. Vertebral arteries: The vertebral arteries are patent and codominant. There is mild bilateral vertebral artery origin stenosis. Skeleton: Chronic periapical lucency associated with the right mandibular first premolar tooth. New prominent periapical lucency associated with the right mandibular second molar tooth. Advanced cervical and upper thoracic disc and facet degeneration. Grade 1 anterolisthesis of C3 on C4, C7 on T1, and T1 on T2. Bilateral facet ankylosis at C7-T1. Moderate multilevel osseous neural foraminal stenosis in the cervical spine. Other neck: No evidence of acute abnormality or mass. Upper chest: Clear lung apices. IMPRESSION: 1. Progressive atherosclerosis since 2014. 2. 55% mid left common carotid artery stenosis. 3. No significant ICA stenosis. 4. Mild bilateral vertebral artery origin stenosis. Aortic Atherosclerosis (ICD10-I70.0). Electronically Signed   By: Logan Bores M.D.   On: 08/03/2018 13:36    Assessment/Plan Essential (primary) hypertension blood pressure control important in reducing the progression of atherosclerotic disease. On appropriate oral medications.   Combined fat and carbohydrate induced hyperlipemia lipid control important in reducing the progression of atherosclerotic disease. Continue statin therapy   CAD in native artery If we end up doing surgery, we will ask his cardiologist to assess his surgical risk.  Carotid stenosis His CT angiogram demonstrates far less stenosis that had been suggested by the previous ultrasound.  His right carotid artery had mild atherosclerotic plaque without any significant  stenosis.  His left common carotid artery did have a fair bit of softer plaque, but the degree of stenosis was in the 50 to 55% range.  At this point, at this degree of stenosis surgery is likely not of great benefit.  Given the degree of stenosis, medical management should be roughly equivalent to surgery.  He will continue his current medical regimen including high-dose  Lipitor, and dual antiplatelet therapy with aspirin and Plavix.  I will see him back in 3 months with a follow-up duplex in our office for further evaluation.    Leotis Pain, MD  08/16/2018 12:59 PM    This note was created with Dragon medical transcription system.  Any errors from dictation are purely unintentional

## 2018-08-16 NOTE — Assessment & Plan Note (Signed)
His CT angiogram demonstrates far less stenosis that had been suggested by the previous ultrasound.  His right carotid artery had mild atherosclerotic plaque without any significant stenosis.  His left common carotid artery did have a fair bit of softer plaque, but the degree of stenosis was in the 50 to 55% range.  At this point, at this degree of stenosis surgery is likely not of great benefit.  Given the degree of stenosis, medical management should be roughly equivalent to surgery.  He will continue his current medical regimen including high-dose Lipitor, and dual antiplatelet therapy with aspirin and Plavix.  I will see him back in 3 months with a follow-up duplex in our office for further evaluation.

## 2018-09-05 ENCOUNTER — Telehealth: Payer: Self-pay | Admitting: Urology

## 2018-09-05 NOTE — Telephone Encounter (Signed)
Pt would like a return to speak with someone about starting shots to boost testosterone.  Please give pt a call. [phone]

## 2018-09-09 NOTE — Telephone Encounter (Signed)
Left pt mess to call 

## 2018-09-14 DIAGNOSIS — E291 Testicular hypofunction: Secondary | ICD-10-CM | POA: Diagnosis not present

## 2018-09-14 DIAGNOSIS — R5383 Other fatigue: Secondary | ICD-10-CM | POA: Diagnosis not present

## 2018-09-14 DIAGNOSIS — G479 Sleep disorder, unspecified: Secondary | ICD-10-CM | POA: Diagnosis not present

## 2018-09-14 DIAGNOSIS — M2550 Pain in unspecified joint: Secondary | ICD-10-CM | POA: Diagnosis not present

## 2018-09-16 ENCOUNTER — Other Ambulatory Visit: Payer: Self-pay | Admitting: Family Medicine

## 2018-09-23 DIAGNOSIS — G479 Sleep disorder, unspecified: Secondary | ICD-10-CM | POA: Diagnosis not present

## 2018-09-23 DIAGNOSIS — M2550 Pain in unspecified joint: Secondary | ICD-10-CM | POA: Diagnosis not present

## 2018-09-23 DIAGNOSIS — E291 Testicular hypofunction: Secondary | ICD-10-CM | POA: Diagnosis not present

## 2018-09-23 DIAGNOSIS — N529 Male erectile dysfunction, unspecified: Secondary | ICD-10-CM | POA: Diagnosis not present

## 2018-10-03 ENCOUNTER — Ambulatory Visit (INDEPENDENT_AMBULATORY_CARE_PROVIDER_SITE_OTHER): Payer: PPO | Admitting: Family Medicine

## 2018-10-03 ENCOUNTER — Encounter: Payer: Self-pay | Admitting: Family Medicine

## 2018-10-03 VITALS — BP 132/67 | HR 61 | Temp 98.2°F | Resp 16 | Ht 71.0 in | Wt 171.0 lb

## 2018-10-03 DIAGNOSIS — Z23 Encounter for immunization: Secondary | ICD-10-CM

## 2018-10-03 DIAGNOSIS — E7849 Other hyperlipidemia: Secondary | ICD-10-CM | POA: Diagnosis not present

## 2018-10-03 DIAGNOSIS — Z0000 Encounter for general adult medical examination without abnormal findings: Secondary | ICD-10-CM | POA: Diagnosis not present

## 2018-10-03 DIAGNOSIS — F329 Major depressive disorder, single episode, unspecified: Secondary | ICD-10-CM | POA: Diagnosis not present

## 2018-10-03 DIAGNOSIS — I10 Essential (primary) hypertension: Secondary | ICD-10-CM | POA: Diagnosis not present

## 2018-10-03 DIAGNOSIS — F32A Depression, unspecified: Secondary | ICD-10-CM

## 2018-10-03 NOTE — Progress Notes (Signed)
Patient: Timothy Singleton, Male    DOB: [birth year], 72 y.o.   MRN: [id number] Visit Date: 10/03/2018  Today's Provider: Wilhemena Durie, MD   Chief Complaint  Patient presents with  . Annual Wellness Exam   Subjective:    Annual physical exam Timothy Singleton is a 72 y.o. male who presents today for health maintenance and complete physical. He feels well. He reports exercising not regularly, but he does stay active. He reports he is sleeping well.  Colonoscopy- 11/17/2016. Diverticulosis. Internal hemorrhoids. Repeat in 5 years.    Review of Systems  Constitutional: Negative.   HENT: Negative.   Eyes: Negative.   Respiratory: Negative.   Cardiovascular: Negative.   Gastrointestinal: Negative.   Endocrine: Negative.   Genitourinary: Negative.   Musculoskeletal: Negative.   Skin: Negative.   Allergic/Immunologic: Negative.   Neurological: Negative.   Hematological: Negative.   Psychiatric/Behavioral: Negative.     Social History      He  reports that he has quit smoking. He has quit using smokeless tobacco. He reports current alcohol use. He reports that he does not use drugs.       Social History   Socioeconomic History  . Marital status: Married    Spouse name: Not on file  . Number of children: Not on file  . Years of education: Not on file  . Highest education level: Not on file  Occupational History  . Not on file  Social Needs  . Financial resource strain: Not on file  . Food insecurity:    Worry: Not on file    Inability: Not on file  . Transportation needs:    Medical: Not on file    Non-medical: Not on file  Tobacco Use  . Smoking status: Former Research scientist (life sciences)  . Smokeless tobacco: Former Systems developer  . Tobacco comment: quit 40 years ago   Substance and Sexual Activity  . Alcohol use: Yes    Alcohol/week: 0.0 standard drinks    Comment: 2-4 times per month  . Drug use: No  . Sexual activity: Not on file  Lifestyle  . Physical activity:      Days per week: Not on file    Minutes per session: Not on file  . Stress: Not on file  Relationships  . Social connections:    Talks on phone: Not on file    Gets together: Not on file    Attends religious service: Not on file    Active member of club or organization: Not on file    Attends meetings of clubs or organizations: Not on file    Relationship status: Not on file  Other Topics Concern  . Not on file  Social History Narrative  . Not on file    Past Medical History:  Diagnosis Date  . Arthritis   . Cancer ([place])    melanoma / knee  . Depression   . GERD (gastroesophageal reflux disease)   . Hyperlipemia   . Hypertension   . Past heart attack   . Sleep apnea      Patient Active Problem List   Diagnosis Date Noted  . Carotid stenosis 07/22/2018  . Arthritis of shoulder region, degenerative 09/10/2015  . Clinical depression 07/22/2015  . Acid reflux 07/22/2015  . IBS (irritable bowel syndrome) 07/22/2015  . AK (actinic keratosis) 07/22/2015  . TIA (transient ischemic attack) 07/22/2015  . OSA (obstructive sleep apnea) 07/22/2015  . Idiopathic trigeminal neuralgia 07/22/2015  .  CAD in native artery 07/22/2015  . GERD (gastroesophageal reflux disease) 07/22/2015  . Allergic rhinitis 07/22/2015  . Arteriosclerosis of coronary artery 10/16/2014  . Essential (primary) hypertension 08/21/2014  . Combined fat and carbohydrate induced hyperlipemia 08/21/2014  . Heart attack ([place]) 08/13/2014  . Benign prostatic hyperplasia without urinary obstruction 01/16/2014  . Renal colic 09/38/1829    Past Surgical History:  Procedure Laterality Date  . BUNIONECTOMY    . COLONOSCOPY WITH PROPOFOL N/A 11/17/2016   Procedure: COLONOSCOPY WITH PROPOFOL;  Surgeon: Jonathon Bellows, MD;  Location: [facility] ENDOSCOPY;  Service: Endoscopy;  Laterality: N/A;  . CORONARY STENT PLACEMENT    . EXTRACORPOREAL SHOCK WAVE LITHOTRIPSY    . EXTRACORPOREAL SHOCK WAVE LITHOTRIPSY Right 12/30/2017    Procedure: EXTRACORPOREAL SHOCK WAVE LITHOTRIPSY (ESWL);  Surgeon: Hollice Espy, MD;  Location: [facility] ORS;  Service: Urology;  Laterality: Right;  . HERNIA REPAIR     inguinal-right  . JOINT REPLACEMENT     total shoulder replacement  . KNEE SURGERY Right   . TONSILLECTOMY    . TOTAL SHOULDER REPLACEMENT    . UPPER GI ENDOSCOPY  10/18/01   hiatus hernia  . VASECTOMY    . WRIST SURGERY      Family History        Family Status  Relation Name Status  . Mother  Deceased at age 34  . Father  Deceased at age 58  . Sister  Alive  . Brother  Alive  . Daughter  Alive  . Son  Alive  . Brother  Deceased at age 8  . Brother  Alive  . MGM  (Not Specified)  . Neg Hx  (Not Specified)        His family history includes Breast cancer in his sister; Cancer in his mother; Dementia in his mother; Heart disease in his father; Hypertension in his brother and father; Melanoma in his maternal grandmother; Parkinson's disease in his brother; Stroke in his father. There is no history of Bladder Cancer, Prostate cancer, or Kidney cancer.      No Known Allergies   Current Outpatient Medications:  .  aspirin 81 MG tablet, Take 81 mg by mouth daily., Disp: , Rfl:  .  atorvastatin (LIPITOR) 80 MG tablet, Take by mouth., Disp: , Rfl:  .  clopidogrel (PLAVIX) 75 MG tablet, Take by mouth., Disp: , Rfl:  .  lisinopril (PRINIVIL,ZESTRIL) 20 MG tablet, , Disp: , Rfl:  .  sertraline (ZOLOFT) 100 MG tablet, Take 1 tablet (100 mg total) by mouth daily., Disp: 90 tablet, Rfl: 3 .  sildenafil (REVATIO) 20 MG tablet, 1 to 4 tablets daily as needed, Disp: 50 tablet, Rfl: 11 .  tamsulosin (FLOMAX) 0.4 MG CAPS capsule, TAKE 1 CAPSULE BY MOUTH EVERY DAY, Disp: 90 capsule, Rfl: 4 .  amoxicillin (AMOXIL) 500 MG tablet, , Disp: , Rfl:    Patient Care Team: Jerrol Banana., MD as PCP - General (Family Medicine) Dingeldein, Remo Lipps, MD as Consulting Physician (Ophthalmology) Corey Skains, MD as Consulting  Physician (Cardiology) Dasher, Rayvon Char, MD as Consulting Physician (Dermatology) Hollice Espy, MD as Consulting Physician (Urology)      Objective:   Vitals: BP 132/67 (BP Location: Right Arm, Patient Position: Sitting, Cuff Size: Normal) Comment: electronic cuff  Pulse 61   Temp 98.2 F (36.8 C)   Resp 16   Ht 5\' 11"  (1.803 m)   Wt 171 lb (77.6 kg)   SpO2 99%   BMI 23.85 kg/m  Vitals:   10/03/18 0946  BP: 132/67  Pulse: 61  Resp: 16  Temp: 98.2 F (36.8 C)  SpO2: 99%  Weight: 171 lb (77.6 kg)  Height: 5\' 11"  (1.803 m)     Physical Exam Constitutional:      Appearance: He is well-developed.  HENT:     Head: Normocephalic and atraumatic.     Right Ear: External ear normal.     Left Ear: External ear normal.     Nose: Nose normal.  Eyes:     General: No scleral icterus.    Conjunctiva/sclera: Conjunctivae normal.     Pupils: Pupils are equal, round, and reactive to light.  Neck:     Musculoskeletal: Neck supple.     Thyroid: No thyromegaly.  Cardiovascular:     Rate and Rhythm: Normal rate and regular rhythm.     Heart sounds: Normal heart sounds.  Pulmonary:     Effort: Pulmonary effort is normal.     Breath sounds: Normal breath sounds.  Abdominal:     Palpations: Abdomen is soft.  Genitourinary:    Comments: Defer DRE Musculoskeletal: Normal range of motion.  Lymphadenopathy:     Cervical: No cervical adenopathy.  Skin:    General: Skin is warm and dry.  Neurological:     Mental Status: He is alert and oriented to person, place, and time.     Deep Tendon Reflexes: Reflexes are normal and symmetric.  Psychiatric:        Behavior: Behavior normal.        Thought Content: Thought content normal.        Judgment: Judgment normal.      Depression Screen PHQ 2/9 Scores 10/03/2018 03/30/2018 09/29/2017 11/13/2016  PHQ - 2 Score 0 0 0 0  PHQ- 9 Score 2 0 1 -   Cognitive Testing - 6-CIT  Correct? Score   What year is it? yes 0 0 or 4  What  month is it? yes 0 0 or 3  Memorize:    [place], [place],  [birth year],  [address]  [place],      What time is it? (within 1 hour) yes 0 0 or 3  Count backwards from 20 yes 0 0, 2, or 4  Name the months of the year yes 0 0, 2, or 4  Repeat name & address above yes 0 0, 2, 4, 6, 8, or 10       TOTAL SCORE  0/28   Interpretation:  Normal  Normal (0-7) Abnormal (8-28)   Functional Status Survey: Is the patient deaf or have difficulty hearing?: No Does the patient have difficulty seeing, even when wearing glasses/contacts?: No Does the patient have difficulty concentrating, remembering, or making decisions?: No Does the patient have difficulty walking or climbing stairs?: No Does the patient have difficulty dressing or bathing?: No Does the patient have difficulty doing errands alone such as visiting a doctor's office or shopping?: No    Office Visit from 10/03/2018 in [place]  AUDIT-C Score  2        Assessment & Plan:     Routine Health Maintenance and Physical Exam  Exercise Activities and Dietary recommendations Goals    . Increase water intake     Starting 11/13/16, I will increase my water intake to 4 glasses a day.       Immunization History  Administered Date(s) Administered  . Influenza, High Dose Seasonal PF 07/23/2015, 09/30/2016, 09/29/2017  . Pneumococcal Conjugate-13  07/11/2014  . Pneumococcal Polysaccharide-23 09/30/2016  . Td 09/20/2003  . Zoster 01/28/2010    Health Maintenance  Topic Date Due  . Samul Dada  09/19/2013  . INFLUENZA VACCINE  05/19/2018  . COLONOSCOPY  11/17/2021  . Hepatitis C Screening  Completed  . PNA vac Low Risk Adult  Completed     Discussed health benefits of physical activity, and encouraged him to engage in regular exercise appropriate for his age and condition.   I have done the exam and reviewed the chart and it is accurate to the best of my knowledge. Development worker, community has been used and  any errors in  dictation or transcription are unintentional. Miguel Aschoff M.D. [place], MD  [facility]

## 2018-10-04 LAB — CBC WITH DIFFERENTIAL/PLATELET
BASOS ABS: 0 10*3/uL (ref 0.0–0.2)
Basos: 1 %
EOS (ABSOLUTE): 0.3 10*3/uL (ref 0.0–0.4)
Eos: 6 %
Hematocrit: 40.8 % (ref 37.5–51.0)
Hemoglobin: 13.6 g/dL (ref 13.0–17.7)
Immature Grans (Abs): 0 10*3/uL (ref 0.0–0.1)
Immature Granulocytes: 0 %
Lymphocytes Absolute: 1.2 10*3/uL (ref 0.7–3.1)
Lymphs: 22 %
MCH: 29.4 pg (ref 26.6–33.0)
MCHC: 33.3 g/dL (ref 31.5–35.7)
MCV: 88 fL (ref 79–97)
Monocytes Absolute: 0.6 10*3/uL (ref 0.1–0.9)
Monocytes: 12 %
Neutrophils Absolute: 3 10*3/uL (ref 1.4–7.0)
Neutrophils: 59 %
PLATELETS: 245 10*3/uL (ref 150–450)
RBC: 4.62 x10E6/uL (ref 4.14–5.80)
RDW: 15.2 % (ref 12.3–15.4)
WBC: 5.2 10*3/uL (ref 3.4–10.8)

## 2018-10-04 LAB — COMPREHENSIVE METABOLIC PANEL
ALT: 24 IU/L (ref 0–44)
AST: 26 IU/L (ref 0–40)
Albumin/Globulin Ratio: 2.3 — ABNORMAL HIGH (ref 1.2–2.2)
Albumin: 4.1 g/dL (ref 3.5–4.8)
Alkaline Phosphatase: 85 IU/L (ref 39–117)
BUN/Creatinine Ratio: 13 (ref 10–24)
BUN: 16 mg/dL (ref 8–27)
Bilirubin Total: 0.4 mg/dL (ref 0.0–1.2)
CO2: 25 mmol/L (ref 20–29)
Calcium: 9.9 mg/dL (ref 8.6–10.2)
Chloride: 99 mmol/L (ref 96–106)
Creatinine, Ser: 1.19 mg/dL (ref 0.76–1.27)
GFR calc Af Amer: 70 mL/min/{1.73_m2} (ref >59)
GFR calc non Af Amer: 60 mL/min/{1.73_m2} (ref >59)
Globulin, Total: 1.8 g/dL (ref 1.5–4.5)
Glucose: 94 mg/dL (ref 65–99)
Potassium: 4.7 mmol/L (ref 3.5–5.2)
Sodium: 137 mmol/L (ref 134–144)
Total Protein: 5.9 g/dL — ABNORMAL LOW (ref 6.0–8.5)

## 2018-10-04 LAB — LIPID PANEL
CHOLESTEROL TOTAL: 90 mg/dL — AB (ref 100–199)
Chol/HDL Ratio: 2.6 ratio (ref 0.0–5.0)
HDL: 34 mg/dL — AB (ref >39)
LDL Calculated: 45 mg/dL (ref 0–99)
TRIGLYCERIDES: 57 mg/dL (ref 0–149)
VLDL Cholesterol Cal: 11 mg/dL (ref 5–40)

## 2018-10-04 LAB — TSH: TSH: 1.51 u[IU]/mL (ref 0.450–4.500)

## 2018-10-05 DIAGNOSIS — C44722 Squamous cell carcinoma of skin of right lower limb, including hip: Secondary | ICD-10-CM | POA: Diagnosis not present

## 2018-10-05 DIAGNOSIS — C44629 Squamous cell carcinoma of skin of left upper limb, including shoulder: Secondary | ICD-10-CM | POA: Diagnosis not present

## 2018-10-05 DIAGNOSIS — C44622 Squamous cell carcinoma of skin of right upper limb, including shoulder: Secondary | ICD-10-CM | POA: Diagnosis not present

## 2018-10-07 ENCOUNTER — Telehealth: Payer: Self-pay

## 2018-10-07 NOTE — Telephone Encounter (Signed)
Patient has been advised. KW 

## 2018-10-07 NOTE — Telephone Encounter (Signed)
-----   Message from Jerrol Banana., MD sent at 10/06/2018  2:44 PM EST ----- Labs good

## 2018-10-21 DIAGNOSIS — T8141XA Infection following a procedure, superficial incisional surgical site, initial encounter: Secondary | ICD-10-CM | POA: Diagnosis not present

## 2018-11-14 DIAGNOSIS — L820 Inflamed seborrheic keratosis: Secondary | ICD-10-CM | POA: Diagnosis not present

## 2018-11-14 DIAGNOSIS — L538 Other specified erythematous conditions: Secondary | ICD-10-CM | POA: Diagnosis not present

## 2018-11-14 DIAGNOSIS — L298 Other pruritus: Secondary | ICD-10-CM | POA: Diagnosis not present

## 2018-11-15 ENCOUNTER — Other Ambulatory Visit: Payer: Self-pay | Admitting: Family Medicine

## 2018-11-15 NOTE — Telephone Encounter (Signed)
Pharmacy requesting refills. Thanks!  

## 2018-11-18 ENCOUNTER — Ambulatory Visit (INDEPENDENT_AMBULATORY_CARE_PROVIDER_SITE_OTHER): Payer: PPO | Admitting: Vascular Surgery

## 2018-11-18 ENCOUNTER — Ambulatory Visit (INDEPENDENT_AMBULATORY_CARE_PROVIDER_SITE_OTHER): Payer: PPO

## 2018-11-18 ENCOUNTER — Encounter (INDEPENDENT_AMBULATORY_CARE_PROVIDER_SITE_OTHER): Payer: Self-pay | Admitting: Vascular Surgery

## 2018-11-18 VITALS — BP 147/83 | HR 60 | Resp 14 | Ht 71.0 in | Wt 173.2 lb

## 2018-11-18 DIAGNOSIS — E782 Mixed hyperlipidemia: Secondary | ICD-10-CM

## 2018-11-18 DIAGNOSIS — I6523 Occlusion and stenosis of bilateral carotid arteries: Secondary | ICD-10-CM

## 2018-11-18 DIAGNOSIS — Z87891 Personal history of nicotine dependence: Secondary | ICD-10-CM | POA: Diagnosis not present

## 2018-11-18 DIAGNOSIS — I10 Essential (primary) hypertension: Secondary | ICD-10-CM

## 2018-11-18 NOTE — Progress Notes (Signed)
Subjective:    Patient ID: Timothy Singleton, male    DOB: 29-May-1946, 73 y.o.   MRN: [id number] Chief Complaint  Patient presents with  . Follow-up   Patient presents for a 31-month carotid artery stenosis follow-up. The patient underwent a bilateral carotid duplex scan which showed no change from the previous exam on 12/12/12. Duplex is stable at Right ICA stenosis (1-39%) and Left ICA stenosis (40-59%).  Bilateral vertebral arteries demonstrate antegrade flow.  Normal flow hemodynamics were seen bilateral subclavian arteries the patient denies experiencing Amaurosis Fugax, TIA like symptoms or focal motor deficits.  Denies fever, nausea vomiting.  Review of Systems  Constitutional: Negative.   HENT: Negative.   Eyes: Negative.   Respiratory: Negative.   Cardiovascular: Negative.   Gastrointestinal: Negative.   Endocrine: Negative.   Genitourinary: Negative.   Musculoskeletal: Negative.   Skin: Negative.   Allergic/Immunologic: Negative.   Neurological: Negative.   Hematological: Negative.   Psychiatric/Behavioral: Negative.       Objective:   Physical Exam Vitals signs reviewed.  Constitutional:      Appearance: Normal appearance.  HENT:     Head: Normocephalic and atraumatic.     Right Ear: External ear normal.     Left Ear: External ear normal.     Mouth/Throat:     Mouth: Mucous membranes are moist.     Pharynx: Oropharynx is clear.  Eyes:     Extraocular Movements: Extraocular movements intact.     Conjunctiva/sclera: Conjunctivae normal.     Pupils: Pupils are equal, round, and reactive to light.  Neck:     Musculoskeletal: Normal range of motion.     Comments: No carotid bruits noted on exam Cardiovascular:     Rate and Rhythm: Normal rate and regular rhythm.  Pulmonary:     Effort: Pulmonary effort is normal.     Breath sounds: Normal breath sounds.  Musculoskeletal: Normal range of motion.        General: No swelling.  Skin:    General: Skin is warm  and dry.  Neurological:     General: No focal deficit present.     Mental Status: He is alert and oriented to person, place, and time. Mental status is at baseline.  Psychiatric:        Mood and Affect: Mood normal.        Behavior: Behavior normal.        Thought Content: Thought content normal.        Judgment: Judgment normal.    BP (!) 147/83 (BP Location: Right Arm, Patient Position: Sitting)   Pulse 60   Resp 14   Ht 5\' 11"  (1.803 m)   Wt 173 lb 3.2 oz (78.6 kg)   BMI 24.16 kg/m   Past Medical History:  Diagnosis Date  . Arthritis   . Cancer ([place])    melanoma / knee  . Depression   . GERD (gastroesophageal reflux disease)   . Hyperlipemia   . Hypertension   . Past heart attack   . Sleep apnea    Social History   Socioeconomic History  . Marital status: Married    Spouse name: Not on file  . Number of children: Not on file  . Years of education: Not on file  . Highest education level: Not on file  Occupational History  . Not on file  Social Needs  . Financial resource strain: Not on file  . Food insecurity:    Worry: Not on  file    Inability: Not on file  . Transportation needs:    Medical: Not on file    Non-medical: Not on file  Tobacco Use  . Smoking status: Former Research scientist (life sciences)  . Smokeless tobacco: Former Systems developer  . Tobacco comment: quit 40 years ago   Substance and Sexual Activity  . Alcohol use: Yes    Alcohol/week: 0.0 standard drinks    Comment: 2-4 times per month  . Drug use: No  . Sexual activity: Not on file  Lifestyle  . Physical activity:    Days per week: Not on file    Minutes per session: Not on file  . Stress: Not on file  Relationships  . Social connections:    Talks on phone: Not on file    Gets together: Not on file    Attends religious service: Not on file    Active member of club or organization: Not on file    Attends meetings of clubs or organizations: Not on file    Relationship status: Not on file  . Intimate partner  violence:    Fear of current or ex partner: Not on file    Emotionally abused: Not on file    Physically abused: Not on file    Forced sexual activity: Not on file  Other Topics Concern  . Not on file  Social History Narrative  . Not on file   Past Surgical History:  Procedure Laterality Date  . BUNIONECTOMY    . COLONOSCOPY WITH PROPOFOL N/A 11/17/2016   Procedure: COLONOSCOPY WITH PROPOFOL;  Surgeon: Jonathon Bellows, MD;  Location: [facility] ENDOSCOPY;  Service: Endoscopy;  Laterality: N/A;  . CORONARY STENT PLACEMENT    . EXTRACORPOREAL SHOCK WAVE LITHOTRIPSY    . EXTRACORPOREAL SHOCK WAVE LITHOTRIPSY Right 12/30/2017   Procedure: EXTRACORPOREAL SHOCK WAVE LITHOTRIPSY (ESWL);  Surgeon: Hollice Espy, MD;  Location: [facility] ORS;  Service: Urology;  Laterality: Right;  . HERNIA REPAIR     inguinal-right  . JOINT REPLACEMENT     total shoulder replacement  . KNEE SURGERY Right   . TONSILLECTOMY    . TOTAL SHOULDER REPLACEMENT    . UPPER GI ENDOSCOPY  10/18/01   hiatus hernia  . VASECTOMY    . WRIST SURGERY     Family History  Problem Relation Age of Onset  . Cancer Mother   . Dementia Mother   . Stroke Father   . Heart disease Father   . Hypertension Father   . Breast cancer Sister   . Parkinson's disease Brother   . Hypertension Brother   . Melanoma Maternal Grandmother   . Bladder Cancer Neg Hx   . Prostate cancer Neg Hx   . Kidney cancer Neg Hx    No Known Allergies     Assessment & Plan:  Patient presents for a 9-month carotid artery stenosis follow-up. The patient underwent a bilateral carotid duplex scan which showed no change from the previous exam on 12/12/12. Duplex is stable at Right ICA stenosis (1-39%) and Left ICA stenosis (40-59%).  Bilateral vertebral arteries demonstrate antegrade flow.  Normal flow hemodynamics were seen bilateral subclavian arteries the patient denies experiencing Amaurosis Fugax, TIA like symptoms or focal motor deficits.  Denies fever, nausea  vomiting.  1. Bilateral carotid artery stenosis - Stable Studies reviewed with patient. Patient asymptomatic with stable duplex.  No intervention at this time.  Patient to return in one year for surveillance carotid duplex. Patient to continue medical optimization with ASA and  dyslipidemia medication. Patient to remain abstinent of tobacco use. I have discussed with the patient at length the risk factors for and pathogenesis of atherosclerotic disease and encouraged a healthy diet, regular exercise regimen and blood pressure / glucose control.  Patient was instructed to contact our office in the interim with problems such as arm / leg weakness or numbness, speech / swallowing difficulty or temporary monocular blindness. The patient expresses their understanding.   - VAS US CAROTID; Future  2. Combined fat and carbohydrate induced hyperlipemia - Stable Encouraged good control as its slows the progression of atherosclerotic disease  3. Essential (primary) hypertension - Stable Encouraged good control as its slows the progression of atherosclerotic disease  Current Outpatient Medications on File Prior to Visit  Medication Sig Dispense Refill  . amoxicillin (AMOXIL) 500 MG tablet     . aspirin 81 MG tablet Take 81 mg by mouth daily.    Marland Kitchen atorvastatin (LIPITOR) 80 MG tablet Take by mouth.    . clopidogrel (PLAVIX) 75 MG tablet Take by mouth.    Marland Kitchen lisinopril (PRINIVIL,ZESTRIL) 20 MG tablet     . sertraline (ZOLOFT) 100 MG tablet TAKE 1 TABLET BY MOUTH EVERY DAY 90 tablet 3  . sildenafil (REVATIO) 20 MG tablet 1 to 4 tablets daily as needed 50 tablet 11  . tamsulosin (FLOMAX) 0.4 MG CAPS capsule TAKE 1 CAPSULE BY MOUTH EVERY DAY 90 capsule 4   No current facility-administered medications on file prior to visit.    There are no Patient Instructions on file for this visit. No follow-ups on file.  Malani Lees A Ahtziri Jeffries, PA-C

## 2018-12-27 DIAGNOSIS — X32XXXA Exposure to sunlight, initial encounter: Secondary | ICD-10-CM | POA: Diagnosis not present

## 2018-12-27 DIAGNOSIS — Z85828 Personal history of other malignant neoplasm of skin: Secondary | ICD-10-CM | POA: Diagnosis not present

## 2018-12-27 DIAGNOSIS — Z85820 Personal history of malignant melanoma of skin: Secondary | ICD-10-CM | POA: Diagnosis not present

## 2018-12-27 DIAGNOSIS — D2272 Melanocytic nevi of left lower limb, including hip: Secondary | ICD-10-CM | POA: Diagnosis not present

## 2018-12-27 DIAGNOSIS — D2262 Melanocytic nevi of left upper limb, including shoulder: Secondary | ICD-10-CM | POA: Diagnosis not present

## 2018-12-27 DIAGNOSIS — L718 Other rosacea: Secondary | ICD-10-CM | POA: Diagnosis not present

## 2018-12-27 DIAGNOSIS — Z08 Encounter for follow-up examination after completed treatment for malignant neoplasm: Secondary | ICD-10-CM | POA: Diagnosis not present

## 2018-12-27 DIAGNOSIS — L570 Actinic keratosis: Secondary | ICD-10-CM | POA: Diagnosis not present

## 2019-01-04 DIAGNOSIS — S91102A Unspecified open wound of left great toe without damage to nail, initial encounter: Secondary | ICD-10-CM | POA: Diagnosis not present

## 2019-01-04 DIAGNOSIS — Z23 Encounter for immunization: Secondary | ICD-10-CM | POA: Diagnosis not present

## 2019-01-11 ENCOUNTER — Telehealth: Payer: Self-pay

## 2019-01-11 NOTE — Telephone Encounter (Signed)
Patient is requesting a call from Dr. Rosanna Randy only to discuss his medications. Offered patient a e-visit he declined. He states he has been a long time patient and friend of Dr. Rosanna Randy and would just like a call back. CB# [phone]

## 2019-01-11 NOTE — Telephone Encounter (Signed)
Patient more depressed but not suicidal.  Over the phone increase the sertraline to 1-1/2 tablets of his 100 mg.  Please call him to make a virtual visit in a couple of weeks.  Thank you.  I spoke to him on the phone this afternoon to make this change.

## 2019-01-11 NOTE — Telephone Encounter (Signed)
Please review. Thanks!  

## 2019-01-12 NOTE — Telephone Encounter (Signed)
Scheduled patient for 01/23/2019 at 10am. Please put on your schedule. Thanks!

## 2019-01-19 ENCOUNTER — Telehealth: Payer: Self-pay

## 2019-01-19 NOTE — Telephone Encounter (Signed)
Patient has a Webx visit with you on 4/6 @10am . Please send/schedule. Thanks!

## 2019-01-23 ENCOUNTER — Other Ambulatory Visit: Payer: Self-pay

## 2019-01-23 ENCOUNTER — Ambulatory Visit (INDEPENDENT_AMBULATORY_CARE_PROVIDER_SITE_OTHER): Payer: PPO | Admitting: Family Medicine

## 2019-01-23 DIAGNOSIS — F329 Major depressive disorder, single episode, unspecified: Secondary | ICD-10-CM

## 2019-01-23 DIAGNOSIS — F419 Anxiety disorder, unspecified: Secondary | ICD-10-CM

## 2019-01-23 DIAGNOSIS — F32A Depression, unspecified: Secondary | ICD-10-CM

## 2019-01-23 NOTE — Progress Notes (Signed)
Established Patient Office Visit  Subjective:  Patient ID: Timothy Singleton, male    DOB: August 13, 1946  Age: 73 y.o. MRN: [id number]  CC:  Chief Complaint  Patient presents with  . Depression    Follow up after increasing Zoloft to 150mg  a day.    HPI Virtual Visit via Video Note  I connected with Timothy Singleton on 01/23/19 at 10:00 AM EDT by a video enabled telemedicine application and verified that I am speaking with the correct person using two identifiers.   I discussed the limitations of evaluation and management by telemedicine and the availability of in person appointments. The patient expressed understanding and agreed to proceed.  History of Present Illness: Patient tolerated couple weeks ago with significant anxiety and when he increased his sertraline by 50%.  Since that time the patient states he is 80% better and his wife is on the call and also agrees that he is improved.  He has no real depressive symptoms at this time.  Again, anxiety improved.  The last year he has been a, grandfather for the first time and they have moved from their longtime home to a condominium and now the coronavirus issue is around the world.  Past Medical History:  Diagnosis Date  . Arthritis   . Cancer ([place])    melanoma / knee  . Depression   . GERD (gastroesophageal reflux disease)   . Hyperlipemia   . Hypertension   . Past heart attack   . Sleep apnea     Past Surgical History:  Procedure Laterality Date  . BUNIONECTOMY    . COLONOSCOPY WITH PROPOFOL N/A 11/17/2016   Procedure: COLONOSCOPY WITH PROPOFOL;  Surgeon: Jonathon Bellows, MD;  Location: [facility]COPY;  Service: Endoscopy;  Laterality: N/A;  . CORONARY STENT PLACEMENT    . EXTRACORPOREAL SHOCK WAVE LITHOTRIPSY    . EXTRACORPOREAL SHOCK WAVE LITHOTRIPSY Right 12/30/2017   Procedure: EXTRACORPOREAL SHOCK WAVE LITHOTRIPSY (ESWL);  Surgeon: Hollice Espy, MD;  Location: [facility] ORS;  Service: Urology;  Laterality: Right;   . HERNIA REPAIR     inguinal-right  . JOINT REPLACEMENT     total shoulder replacement  . KNEE SURGERY Right   . TONSILLECTOMY    . TOTAL SHOULDER REPLACEMENT    . UPPER GI ENDOSCOPY  10/18/01   hiatus hernia  . VASECTOMY    . WRIST SURGERY      Family History  Problem Relation Age of Onset  . Cancer Mother   . Dementia Mother   . Stroke Father   . Heart disease Father   . Hypertension Father   . Breast cancer Sister   . Parkinson's disease Brother   . Hypertension Brother   . Melanoma Maternal Grandmother   . Bladder Cancer Neg Hx   . Prostate cancer Neg Hx   . Kidney cancer Neg Hx     Social History   Socioeconomic History  . Marital status: Married    Spouse name: Not on file  . Number of children: Not on file  . Years of education: Not on file  . Highest education level: Not on file  Occupational History  . Not on file  Social Needs  . Financial resource strain: Not on file  . Food insecurity:    Worry: Not on file    Inability: Not on file  . Transportation needs:    Medical: Not on file    Non-medical: Not on file  Tobacco Use  . Smoking status:  Former Smoker  . Smokeless tobacco: Former Systems developer  . Tobacco comment: quit 40 years ago   Substance and Sexual Activity  . Alcohol use: Yes    Alcohol/week: 0.0 standard drinks    Comment: 2-4 times per month  . Drug use: No  . Sexual activity: Not on file  Lifestyle  . Physical activity:    Days per week: Not on file    Minutes per session: Not on file  . Stress: Not on file  Relationships  . Social connections:    Talks on phone: Not on file    Gets together: Not on file    Attends religious service: Not on file    Active member of club or organization: Not on file    Attends meetings of clubs or organizations: Not on file    Relationship status: Not on file  . Intimate partner violence:    Fear of current or ex partner: Not on file    Emotionally abused: Not on file    Physically abused: Not on  file    Forced sexual activity: Not on file  Other Topics Concern  . Not on file  Social History Narrative  . Not on file    Outpatient Medications Prior to Visit  Medication Sig Dispense Refill  . amoxicillin (AMOXIL) 500 MG tablet     . aspirin 81 MG tablet Take 81 mg by mouth daily.    Marland Kitchen atorvastatin (LIPITOR) 80 MG tablet Take by mouth.    . clopidogrel (PLAVIX) 75 MG tablet Take by mouth.    Marland Kitchen lisinopril (PRINIVIL,ZESTRIL) 20 MG tablet     . sertraline (ZOLOFT) 100 MG tablet TAKE 1 TABLET BY MOUTH EVERY DAY (Patient taking differently: 150 mg. ) 90 tablet 3  . sildenafil (REVATIO) 20 MG tablet 1 to 4 tablets daily as needed 50 tablet 11  . tamsulosin (FLOMAX) 0.4 MG CAPS capsule TAKE 1 CAPSULE BY MOUTH EVERY DAY 90 capsule 4   No facility-administered medications prior to visit.     No Known Allergies  ROS Review of Systems  Constitutional: Negative.   Respiratory: Negative.   Cardiovascular: Negative.   Psychiatric/Behavioral: Negative.       Objective:    Physical Exam  Constitutional: He is oriented to person, place, and time. He appears well-developed and well-nourished.  HENT:  Head: Normocephalic and atraumatic.  Eyes: Conjunctivae are normal.  Pulmonary/Chest: Effort normal.  Lymphadenopathy:    He has no cervical adenopathy.  Neurological: He is alert and oriented to person, place, and time.  Psychiatric: He has a normal mood and affect. His behavior is normal. Judgment and thought content normal.    There were no vitals taken for this visit. Wt Readings from Last 3 Encounters:  11/18/18 173 lb 3.2 oz (78.6 kg)  10/03/18 171 lb (77.6 kg)  08/16/18 165 lb (74.8 kg)     There are no preventive care reminders to display for this patient.  There are no preventive care reminders to display for this patient.  Lab Results  Component Value Date   TSH 1.510 10/03/2018   Lab Results  Component Value Date   WBC 5.2 10/03/2018   HGB 13.6 10/03/2018    HCT 40.8 10/03/2018   MCV 88 10/03/2018   PLT 245 10/03/2018   Lab Results  Component Value Date   NA 137 10/03/2018   K 4.7 10/03/2018   CO2 25 10/03/2018   GLUCOSE 94 10/03/2018   BUN 16 10/03/2018  CREATININE 1.19 10/03/2018   BILITOT 0.4 10/03/2018   ALKPHOS 85 10/03/2018   AST 26 10/03/2018   ALT 24 10/03/2018   PROT 5.9 (L) 10/03/2018   ALBUMIN 4.1 10/03/2018   CALCIUM 9.9 10/03/2018   ANIONGAP 6 03/11/2016   Lab Results  Component Value Date   CHOL 90 (L) 10/03/2018   Lab Results  Component Value Date   HDL 34 (L) 10/03/2018   Lab Results  Component Value Date   LDLCALC 45 10/03/2018   Lab Results  Component Value Date   TRIG 57 10/03/2018   Lab Results  Component Value Date   CHOLHDL 2.6 10/03/2018   Lab Results  Component Value Date   HGBA1C 5.1 11/02/2016      Assessment & Plan:   Problem List Items Addressed This Visit    None    1. Depression, unspecified depression type Controlled  2. Anxiety Worsening recently.  80% proved on 150 mg of sertraline.  Advised patient he may also consider getting, counseling with Eugenia Pancoast.  Number is given.  Continue present regimen.  I will see him back later in the summer.   No orders of the defined types were placed in this encounter.   Follow-up:      I discussed the assessment and treatment plan with the patient. The patient was provided an opportunity to ask questions and all were answered. The patient agreed with the plan and demonstrated an understanding of the instructions.   The patient was advised to call back or seek an in-person evaluation if the symptoms worsen or if the condition fails to improve as anticipated.  I provided 12 minutes of non-face-to-face time during this encounter.   Ayleah Hofmeister Cranford Mon, MD   Wilhemena Durie, MD

## 2019-03-29 DIAGNOSIS — G479 Sleep disorder, unspecified: Secondary | ICD-10-CM | POA: Diagnosis not present

## 2019-03-29 DIAGNOSIS — M2550 Pain in unspecified joint: Secondary | ICD-10-CM | POA: Diagnosis not present

## 2019-03-29 DIAGNOSIS — E291 Testicular hypofunction: Secondary | ICD-10-CM | POA: Diagnosis not present

## 2019-04-03 DIAGNOSIS — G479 Sleep disorder, unspecified: Secondary | ICD-10-CM | POA: Diagnosis not present

## 2019-04-03 DIAGNOSIS — E291 Testicular hypofunction: Secondary | ICD-10-CM | POA: Diagnosis not present

## 2019-04-03 DIAGNOSIS — R5383 Other fatigue: Secondary | ICD-10-CM | POA: Diagnosis not present

## 2019-05-24 ENCOUNTER — Telehealth: Payer: Self-pay | Admitting: Family Medicine

## 2019-05-24 NOTE — Telephone Encounter (Signed)
Pantoprazole 40 mg daily

## 2019-05-24 NOTE — Telephone Encounter (Signed)
Pt's wife Jocelyn Lamer calling to let Dr. Rosanna Randy know pt has started back coughing after eating.  Wanting to know if the "old" gurd medication can be called in for him.  Please let them know.  Thanks, [employer]

## 2019-05-24 NOTE — Telephone Encounter (Signed)
Patient was on ranitidine 150mg  previously and has not tried any other PPIs. Since this is no longer recommended, what do you prefer that he try?

## 2019-05-25 ENCOUNTER — Encounter: Payer: Self-pay | Admitting: Family Medicine

## 2019-05-25 MED ORDER — PANTOPRAZOLE SODIUM 40 MG PO TBEC: 40.0000 mg | DELAYED_RELEASE_TABLET | Freq: Every day | ORAL | 1 refills | Status: DC

## 2019-05-25 NOTE — Telephone Encounter (Signed)
PT advised.   RX sent to [facility] [place], [place]

## 2019-05-25 NOTE — Telephone Encounter (Signed)
Pt needing a call back to find out what Dr. Rosanna Randy said.  Please call pt back at [phone] asap.  Thanks, [employer]

## 2019-06-22 ENCOUNTER — Telehealth: Payer: Self-pay | Admitting: Family Medicine

## 2019-06-22 NOTE — Telephone Encounter (Signed)
Please review

## 2019-06-22 NOTE — Telephone Encounter (Signed)
Pt called asking for a rx of Reglan.  [facility] [place]  CB#  [phone]  [place]

## 2019-06-23 ENCOUNTER — Other Ambulatory Visit: Payer: Self-pay | Admitting: Family Medicine

## 2019-06-23 DIAGNOSIS — R972 Elevated prostate specific antigen [PSA]: Secondary | ICD-10-CM

## 2019-06-23 NOTE — Telephone Encounter (Signed)
Short term solution--reglan 10mg  before each meal.,#90,1rf

## 2019-06-27 ENCOUNTER — Other Ambulatory Visit: Payer: PPO

## 2019-06-27 MED ORDER — METOCLOPRAMIDE HCL 10 MG PO TABS: 10.0000 mg | ORAL_TABLET | Freq: Three times a day (TID) | ORAL | 1 refills | Status: DC

## 2019-06-27 NOTE — Telephone Encounter (Signed)
Medication sent into the pharmacy. Patient advised.

## 2019-06-30 DIAGNOSIS — Z85820 Personal history of malignant melanoma of skin: Secondary | ICD-10-CM | POA: Diagnosis not present

## 2019-06-30 DIAGNOSIS — Z85828 Personal history of other malignant neoplasm of skin: Secondary | ICD-10-CM | POA: Diagnosis not present

## 2019-06-30 DIAGNOSIS — D2262 Melanocytic nevi of left upper limb, including shoulder: Secondary | ICD-10-CM | POA: Diagnosis not present

## 2019-06-30 DIAGNOSIS — L538 Other specified erythematous conditions: Secondary | ICD-10-CM | POA: Diagnosis not present

## 2019-06-30 DIAGNOSIS — D2272 Melanocytic nevi of left lower limb, including hip: Secondary | ICD-10-CM | POA: Diagnosis not present

## 2019-06-30 DIAGNOSIS — B078 Other viral warts: Secondary | ICD-10-CM | POA: Diagnosis not present

## 2019-06-30 DIAGNOSIS — L570 Actinic keratosis: Secondary | ICD-10-CM | POA: Diagnosis not present

## 2019-06-30 DIAGNOSIS — X32XXXA Exposure to sunlight, initial encounter: Secondary | ICD-10-CM | POA: Diagnosis not present

## 2019-07-04 ENCOUNTER — Ambulatory Visit: Payer: Self-pay | Admitting: Urology

## 2019-08-10 DIAGNOSIS — R52 Pain, unspecified: Secondary | ICD-10-CM | POA: Diagnosis not present

## 2019-08-10 DIAGNOSIS — I2510 Atherosclerotic heart disease of native coronary artery without angina pectoris: Secondary | ICD-10-CM | POA: Diagnosis not present

## 2019-08-10 DIAGNOSIS — E782 Mixed hyperlipidemia: Secondary | ICD-10-CM | POA: Diagnosis not present

## 2019-08-10 DIAGNOSIS — I10 Essential (primary) hypertension: Secondary | ICD-10-CM | POA: Diagnosis not present

## 2019-08-10 DIAGNOSIS — M47812 Spondylosis without myelopathy or radiculopathy, cervical region: Secondary | ICD-10-CM | POA: Insufficient documentation

## 2019-08-10 DIAGNOSIS — H53469 Homonymous bilateral field defects, unspecified side: Secondary | ICD-10-CM | POA: Insufficient documentation

## 2019-08-10 DIAGNOSIS — K5730 Diverticulosis of large intestine without perforation or abscess without bleeding: Secondary | ICD-10-CM | POA: Insufficient documentation

## 2019-08-10 DIAGNOSIS — M542 Cervicalgia: Secondary | ICD-10-CM | POA: Diagnosis not present

## 2019-08-23 DIAGNOSIS — M25512 Pain in left shoulder: Secondary | ICD-10-CM | POA: Diagnosis not present

## 2019-08-23 DIAGNOSIS — Z96612 Presence of left artificial shoulder joint: Secondary | ICD-10-CM | POA: Diagnosis not present

## 2019-08-23 DIAGNOSIS — M25511 Pain in right shoulder: Secondary | ICD-10-CM | POA: Diagnosis not present

## 2019-08-23 DIAGNOSIS — M5030 Other cervical disc degeneration, unspecified cervical region: Secondary | ICD-10-CM | POA: Diagnosis not present

## 2019-08-23 DIAGNOSIS — Z471 Aftercare following joint replacement surgery: Secondary | ICD-10-CM | POA: Diagnosis not present

## 2019-08-23 DIAGNOSIS — Z96611 Presence of right artificial shoulder joint: Secondary | ICD-10-CM | POA: Diagnosis not present

## 2019-08-23 DIAGNOSIS — G8929 Other chronic pain: Secondary | ICD-10-CM | POA: Diagnosis not present

## 2019-09-05 DIAGNOSIS — M5412 Radiculopathy, cervical region: Secondary | ICD-10-CM | POA: Diagnosis not present

## 2019-09-05 DIAGNOSIS — M542 Cervicalgia: Secondary | ICD-10-CM | POA: Diagnosis not present

## 2019-09-13 ENCOUNTER — Other Ambulatory Visit: Payer: Self-pay

## 2019-09-19 DIAGNOSIS — M5412 Radiculopathy, cervical region: Secondary | ICD-10-CM | POA: Diagnosis not present

## 2019-09-19 DIAGNOSIS — M542 Cervicalgia: Secondary | ICD-10-CM | POA: Diagnosis not present

## 2019-09-21 DIAGNOSIS — M5412 Radiculopathy, cervical region: Secondary | ICD-10-CM | POA: Diagnosis not present

## 2019-09-21 DIAGNOSIS — M542 Cervicalgia: Secondary | ICD-10-CM | POA: Diagnosis not present

## 2019-09-22 DIAGNOSIS — G479 Sleep disorder, unspecified: Secondary | ICD-10-CM | POA: Diagnosis not present

## 2019-09-22 DIAGNOSIS — E291 Testicular hypofunction: Secondary | ICD-10-CM | POA: Diagnosis not present

## 2019-09-22 DIAGNOSIS — R5383 Other fatigue: Secondary | ICD-10-CM | POA: Diagnosis not present

## 2019-09-26 DIAGNOSIS — M542 Cervicalgia: Secondary | ICD-10-CM | POA: Diagnosis not present

## 2019-09-26 DIAGNOSIS — M5412 Radiculopathy, cervical region: Secondary | ICD-10-CM | POA: Diagnosis not present

## 2019-09-28 DIAGNOSIS — M5412 Radiculopathy, cervical region: Secondary | ICD-10-CM | POA: Diagnosis not present

## 2019-09-28 DIAGNOSIS — M542 Cervicalgia: Secondary | ICD-10-CM | POA: Diagnosis not present

## 2019-10-03 DIAGNOSIS — M5412 Radiculopathy, cervical region: Secondary | ICD-10-CM | POA: Diagnosis not present

## 2019-10-03 DIAGNOSIS — M542 Cervicalgia: Secondary | ICD-10-CM | POA: Diagnosis not present

## 2019-10-04 DIAGNOSIS — R5383 Other fatigue: Secondary | ICD-10-CM | POA: Diagnosis not present

## 2019-10-04 DIAGNOSIS — E291 Testicular hypofunction: Secondary | ICD-10-CM | POA: Diagnosis not present

## 2019-10-04 DIAGNOSIS — M2550 Pain in unspecified joint: Secondary | ICD-10-CM | POA: Diagnosis not present

## 2019-10-04 NOTE — Progress Notes (Signed)
Patient: Timothy Singleton, Male    DOB: 1946-03-10, 73 y.o.   MRN: [id number] Visit Date: 10/05/2019  Today's Provider: Wilhemena Durie, MD   Chief Complaint  Patient presents with  . Medicare Wellness   Subjective:     Annual wellness visit [name] is a 73 y.o. male. He feels well. He reports exercising some. He reports he is sleeping poorly. Patient is married and a father of 2.  He is retired.  2-4 alcoholic drinks per week.  Some exercise. He has several complaints today.  He has known sleep apnea but does not use CPAP.  In recent months he has developed terminal insomnia where he wakes up early in the morning and cannot go back to sleep. His trigeminal neuralgia is flared recently and he requests a refill on carbamazepine. He also complains of recent neck and shoulder stiffness which is relieved by stretching and massage. His last concern is 1 of dysphagia which occurs about once a month.  He has no other real reflux symptoms.  -----------------------------------------------------------  Colonoscopy: 11/17/2018  Review of Systems  Constitutional: Negative.   HENT: Positive for trouble swallowing.   Eyes: Negative.   Respiratory: Negative.   Cardiovascular: Negative.   Gastrointestinal: Negative.   Endocrine: Negative.   Genitourinary: Negative.   Musculoskeletal: Positive for neck pain and neck stiffness.  Skin: Negative.   Allergic/Immunologic: Negative.   Neurological: Positive for headaches.  Hematological: Bruises/bleeds easily.  Psychiatric/Behavioral: Positive for sleep disturbance.    Social History   Socioeconomic History  . Marital status: Married    Spouse name: Not on file  . Number of children: Not on file  . Years of education: Not on file  . Highest education level: Not on file  Occupational History  . Not on file  Tobacco Use  . Smoking status: Former Research scientist (life sciences)  . Smokeless tobacco: Former Systems developer  . Tobacco comment:  quit 40 years ago   Substance and Sexual Activity  . Alcohol use: Yes    Alcohol/week: 0.0 standard drinks    Comment: 2-4 times per month  . Drug use: No  . Sexual activity: Not on file  Other Topics Concern  . Not on file  Social History Narrative  . Not on file   Social Determinants of Health   Financial Resource Strain:   . Difficulty of Paying Living Expenses: Not on file  Food Insecurity:   . Worried About Charity fundraiser in the Last Year: Not on file  . Ran Out of Food in the Last Year: Not on file  Transportation Needs:   . Lack of Transportation (Medical): Not on file  . Lack of Transportation (Non-Medical): Not on file  Physical Activity:   . Days of Exercise per Week: Not on file  . Minutes of Exercise per Session: Not on file  Stress:   . Feeling of Stress : Not on file  Social Connections:   . Frequency of Communication with Friends and Family: Not on file  . Frequency of Social Gatherings with Friends and Family: Not on file  . Attends Religious Services: Not on file  . Active Member of Clubs or Organizations: Not on file  . Attends Archivist Meetings: Not on file  . Marital Status: Not on file  Intimate Partner Violence:   . Fear of Current or Ex-Partner: Not on file  . Emotionally Abused: Not on file  . Physically Abused: Not on file  .  Sexually Abused: Not on file    Past Medical History:  Diagnosis Date  . Arthritis   . Cancer ([place])    melanoma / knee  . Depression   . GERD (gastroesophageal reflux disease)   . Hyperlipemia   . Hypertension   . Past heart attack   . Sleep apnea      Patient Active Problem List   Diagnosis Date Noted  . Carotid stenosis 07/22/2018  . Arthritis of shoulder region, degenerative 09/10/2015  . Clinical depression 07/22/2015  . Acid reflux 07/22/2015  . IBS (irritable bowel syndrome) 07/22/2015  . AK (actinic keratosis) 07/22/2015  . TIA (transient ischemic attack) 07/22/2015  . OSA  (obstructive sleep apnea) 07/22/2015  . Idiopathic trigeminal neuralgia 07/22/2015  . CAD in native artery 07/22/2015  . GERD (gastroesophageal reflux disease) 07/22/2015  . Allergic rhinitis 07/22/2015  . Arteriosclerosis of coronary artery 10/16/2014  . Essential (primary) hypertension 08/21/2014  . Combined fat and carbohydrate induced hyperlipemia 08/21/2014  . Heart attack ([place]) 08/13/2014  . Benign prostatic hyperplasia without urinary obstruction 01/16/2014  . Renal colic 0000000    Past Surgical History:  Procedure Laterality Date  . BUNIONECTOMY    . COLONOSCOPY WITH PROPOFOL N/A 11/17/2016   Procedure: COLONOSCOPY WITH PROPOFOL;  Surgeon: Jonathon Bellows, MD;  Location: [facility] ENDOSCOPY;  Service: Endoscopy;  Laterality: N/A;  . CORONARY STENT PLACEMENT    . EXTRACORPOREAL SHOCK WAVE LITHOTRIPSY    . EXTRACORPOREAL SHOCK WAVE LITHOTRIPSY Right 12/30/2017   Procedure: EXTRACORPOREAL SHOCK WAVE LITHOTRIPSY (ESWL);  Surgeon: Hollice Espy, MD;  Location: [facility] ORS;  Service: Urology;  Laterality: Right;  . HERNIA REPAIR     inguinal-right  . JOINT REPLACEMENT     total shoulder replacement  . KNEE SURGERY Right   . TONSILLECTOMY    . TOTAL SHOULDER REPLACEMENT    . UPPER GI ENDOSCOPY  10/18/01   hiatus hernia  . VASECTOMY    . WRIST SURGERY      His family history includes Breast cancer in his sister; Cancer in his mother; Dementia in his mother; Heart disease in his father; Hypertension in his brother and father; Melanoma in his maternal grandmother; Parkinson's disease in his brother; Stroke in his father. There is no history of Bladder Cancer, Prostate cancer, or Kidney cancer.   Current Outpatient Medications:  .  amoxicillin (AMOXIL) 500 MG tablet, , Disp: , Rfl:  .  aspirin 81 MG tablet, Take 81 mg by mouth daily., Disp: , Rfl:  .  atorvastatin (LIPITOR) 80 MG tablet, atorvastatin 80 mg tablet  TAKE 1 TABLET BY MOUTH EVERY DAY, Disp: , Rfl:  .  lisinopril  (PRINIVIL,ZESTRIL) 20 MG tablet, , Disp: , Rfl:  .  sertraline (ZOLOFT) 100 MG tablet, TAKE 1 TABLET BY MOUTH EVERY DAY (Patient taking differently: 150 mg. ), Disp: 90 tablet, Rfl: 3 .  tamsulosin (FLOMAX) 0.4 MG CAPS capsule, TAKE 1 CAPSULE BY MOUTH EVERY DAY, Disp: 90 capsule, Rfl: 4 .  metoCLOPramide (REGLAN) 10 MG tablet, Take 1 tablet (10 mg total) by mouth 3 (three) times daily before meals. (Patient not taking: Reported on 10/05/2019), Disp: 90 tablet, Rfl: 1 .  pantoprazole (PROTONIX) 40 MG tablet, Take 1 tablet (40 mg total) by mouth daily. (Patient not taking: Reported on 10/05/2019), Disp: 90 tablet, Rfl: 1 .  sildenafil (REVATIO) 20 MG tablet, 1 to 4 tablets daily as needed (Patient not taking: Reported on 10/05/2019), Disp: 50 tablet, Rfl: 11  Patient Care Team: Miguel Aschoff  Kaylyn Lim., MD as PCP - General (Family Medicine) Dingeldein, Remo Lipps, MD as Consulting Physician (Ophthalmology) Corey Skains, MD as Consulting Physician (Cardiology) Dasher, Rayvon Char, MD as Consulting Physician (Dermatology) Hollice Espy, MD as Consulting Physician (Urology)    Objective:    Vitals: BP 127/68 (BP Location: Right Arm, Patient Position: Sitting, Cuff Size: Large)   Pulse 66   Temp (!) 97.3 F (36.3 C) (Other (Comment))   Resp 18   Ht 5\' 11"  (1.803 m)   Wt 169 lb (76.7 kg)   SpO2 96%   BMI 23.57 kg/m   Physical Exam Vitals reviewed.  Constitutional:      Appearance: He is well-developed.  HENT:     Head: Normocephalic and atraumatic.     Right Ear: External ear normal.     Left Ear: External ear normal.     Nose: Nose normal.  Eyes:     General: No scleral icterus.    Conjunctiva/sclera: Conjunctivae normal.     Pupils: Pupils are equal, round, and reactive to light.  Neck:     Thyroid: No thyromegaly.  Cardiovascular:     Rate and Rhythm: Normal rate and regular rhythm.     Heart sounds: Normal heart sounds.  Pulmonary:     Effort: Pulmonary effort is normal.      Breath sounds: Normal breath sounds.  Abdominal:     Palpations: Abdomen is soft.  Genitourinary:    Penis: Normal.      Testes: Normal.     Prostate: Normal.     Rectum: Normal.     Comments: Defer DRE Musculoskeletal:        General: Normal range of motion.     Cervical back: Neck supple.  Lymphadenopathy:     Cervical: No cervical adenopathy.  Skin:    General: Skin is warm and dry.     Comments: Very fair skin.  Neurological:     General: No focal deficit present.     Mental Status: He is alert and oriented to person, place, and time.     Deep Tendon Reflexes: Reflexes are normal and symmetric.  Psychiatric:        Mood and Affect: Mood normal.        Behavior: Behavior normal.        Thought Content: Thought content normal.        Judgment: Judgment normal.     Activities of Daily Living In your present state of health, do you have any difficulty performing the following activities: 10/05/2019  Hearing? N  Vision? N  Difficulty concentrating or making decisions? N  Walking or climbing stairs? N  Dressing or bathing? N  Doing errands, shopping? N  Some recent data might be hidden    Fall Risk Assessment Fall Risk  10/05/2019 10/03/2018 09/29/2017 11/13/2016 07/23/2015  Falls in the past year? 0 0 No No No  Number falls in past yr: 0 - - - -  Injury with Fall? 0 - - - -  Follow up Falls evaluation completed Falls evaluation completed - - -     Depression Screen PHQ 2/9 Scores 10/05/2019 10/03/2018 03/30/2018 09/29/2017  PHQ - 2 Score 0 0 0 0  PHQ- 9 Score 3 2 0 1    6CIT Screen 10/05/2019  What Year? 0 points  What month? 0 points  What time? 0 points  Count back from 20 0 points  Months in reverse 0 points  Repeat phrase 0 points  Total  Score 0      Assessment & Plan:     Annual Wellness Visit  Reviewed patient's Family Medical History Reviewed and updated list of patient's medical providers Assessment of cognitive impairment was done Assessed  patient's functional ability Established a written schedule for health screening [place] Completed and Reviewed  Exercise Activities and Dietary recommendations Goals    . Increase water intake     Starting 11/13/16, I will increase my water intake to 4 glasses a day.       Immunization History  Administered Date(s) Administered  . Influenza, High Dose Seasonal PF 07/23/2015, 09/30/2016, 09/29/2017, 10/03/2018  . Pneumococcal Conjugate-13 07/11/2014  . Pneumococcal Polysaccharide-23 09/30/2016  . Td 09/20/2003  . Tdap 01/04/2019  . Zoster 01/28/2010    Health Maintenance  Topic Date Due  . INFLUENZA VACCINE  05/20/2019  . COLONOSCOPY  11/17/2021  . TETANUS/TDAP  01/03/2029  . Hepatitis C Screening  Completed  . PNA vac Low Risk Adult  Completed     Discussed health benefits of physical activity, and encouraged him to engage in regular exercise appropriate for his age and condition.    --------------------------------------------------------------------  1. Annual physical exam  - TSH - Lipid panel - Comprehensive Metabolic Panel (CMET) - CBC w/Diff/Platelet  2. Essential (primary) hypertension On lisinopri - TSH - Lipid panel - Comprehensive Metabolic Panel (CMET) - CBC w/Diff/Platelet  3. Other hyperlipidemia On atorvastatin - TSH - Lipid panel - Comprehensive Metabolic Panel (CMET) - CBC w/Diff/Platelet  4. TIA (transient ischemic attack)   5. Arteriosclerosis of coronary artery All risk factors treated  6. OSA (obstructive sleep apnea) Patient has history of OSA by sleep study.  Was unable to tolerate CPAP.  We will try again. -epworth sleep scale --5 - Home sleep test  7. Gastroesophageal reflux disease, unspecified whether esophagitis present May need GI referral if dysphagia continues.  I think PPI will calm it down. - pantoprazole (PROTONIX) 40 MG tablet; Take 1 tablet (40 mg total) by mouth daily.  Dispense: 90  tablet; Refill: 1  8. Idiopathic trigeminal neuralgia Refill carbamazepine which patient states he takes as needed for the headaches.  He states this is worked for him. - carbamazepine (CARBATROL) 200 MG 12 hr capsule; Take 1 capsule (200 mg total) by mouth 2 (two) times daily.  Dispense: 60 capsule; Refill: 5  9. Benign prostatic hyperplasia without urinary obstruction   10. AK (actinic keratosis)  11.Insomnia Probably due to OSA. Follow up in 3 months.   I,Alverda Nazzaro,acting as a scribe for Wilhemena Durie, MD.,have documented all relevant documentation on the behalf of Wilhemena Durie, MD,as directed by  Wilhemena Durie, MD while in the presence of Wilhemena Durie, MD.    Wilhemena Durie, MD  [facility]

## 2019-10-05 ENCOUNTER — Ambulatory Visit (INDEPENDENT_AMBULATORY_CARE_PROVIDER_SITE_OTHER): Payer: PPO | Admitting: Family Medicine

## 2019-10-05 ENCOUNTER — Other Ambulatory Visit: Payer: Self-pay

## 2019-10-05 ENCOUNTER — Encounter: Payer: Self-pay | Admitting: Family Medicine

## 2019-10-05 VITALS — BP 127/68 | HR 66 | Temp 97.3°F | Resp 18 | Ht 71.0 in | Wt 169.0 lb

## 2019-10-05 DIAGNOSIS — G4733 Obstructive sleep apnea (adult) (pediatric): Secondary | ICD-10-CM | POA: Diagnosis not present

## 2019-10-05 DIAGNOSIS — N400 Enlarged prostate without lower urinary tract symptoms: Secondary | ICD-10-CM

## 2019-10-05 DIAGNOSIS — G4709 Other insomnia: Secondary | ICD-10-CM | POA: Diagnosis not present

## 2019-10-05 DIAGNOSIS — G459 Transient cerebral ischemic attack, unspecified: Secondary | ICD-10-CM

## 2019-10-05 DIAGNOSIS — E7849 Other hyperlipidemia: Secondary | ICD-10-CM | POA: Diagnosis not present

## 2019-10-05 DIAGNOSIS — Z0000 Encounter for general adult medical examination without abnormal findings: Secondary | ICD-10-CM

## 2019-10-05 DIAGNOSIS — K219 Gastro-esophageal reflux disease without esophagitis: Secondary | ICD-10-CM

## 2019-10-05 DIAGNOSIS — I2510 Atherosclerotic heart disease of native coronary artery without angina pectoris: Secondary | ICD-10-CM | POA: Diagnosis not present

## 2019-10-05 DIAGNOSIS — G500 Trigeminal neuralgia: Secondary | ICD-10-CM

## 2019-10-05 DIAGNOSIS — M542 Cervicalgia: Secondary | ICD-10-CM | POA: Diagnosis not present

## 2019-10-05 DIAGNOSIS — M5412 Radiculopathy, cervical region: Secondary | ICD-10-CM | POA: Diagnosis not present

## 2019-10-05 DIAGNOSIS — I10 Essential (primary) hypertension: Secondary | ICD-10-CM | POA: Diagnosis not present

## 2019-10-05 DIAGNOSIS — L570 Actinic keratosis: Secondary | ICD-10-CM

## 2019-10-05 MED ORDER — CARBAMAZEPINE ER 200 MG PO CP12: 200.0000 mg | ORAL_CAPSULE | Freq: Two times a day (BID) | ORAL | 5 refills | Status: DC

## 2019-10-05 MED ORDER — PANTOPRAZOLE SODIUM 40 MG PO TBEC: 40.0000 mg | DELAYED_RELEASE_TABLET | Freq: Every day | ORAL | 1 refills | Status: DC

## 2019-10-06 DIAGNOSIS — Z0000 Encounter for general adult medical examination without abnormal findings: Secondary | ICD-10-CM | POA: Diagnosis not present

## 2019-10-06 DIAGNOSIS — E7849 Other hyperlipidemia: Secondary | ICD-10-CM | POA: Diagnosis not present

## 2019-10-06 DIAGNOSIS — I10 Essential (primary) hypertension: Secondary | ICD-10-CM | POA: Diagnosis not present

## 2019-10-07 ENCOUNTER — Other Ambulatory Visit: Payer: Self-pay | Admitting: Family Medicine

## 2019-10-07 LAB — CBC WITH DIFFERENTIAL/PLATELET
Basophils Absolute: 0 10*3/uL (ref 0.0–0.2)
Basos: 1 %
EOS (ABSOLUTE): 0.4 10*3/uL (ref 0.0–0.4)
Eos: 8 %
Hematocrit: 43.1 % (ref 37.5–51.0)
Hemoglobin: 14.8 g/dL (ref 13.0–17.7)
Immature Grans (Abs): 0 10*3/uL (ref 0.0–0.1)
Immature Granulocytes: 0 %
Lymphocytes Absolute: 1.4 10*3/uL (ref 0.7–3.1)
Lymphs: 24 %
MCH: 31 pg (ref 26.6–33.0)
MCHC: 34.3 g/dL (ref 31.5–35.7)
MCV: 90 fL (ref 79–97)
Monocytes Absolute: 0.6 10*3/uL (ref 0.1–0.9)
Monocytes: 10 %
Neutrophils Absolute: 3.3 10*3/uL (ref 1.4–7.0)
Neutrophils: 57 %
Platelets: 210 10*3/uL (ref 150–450)
RBC: 4.77 x10E6/uL (ref 4.14–5.80)
RDW: 13.7 % (ref 11.6–15.4)
WBC: 5.7 10*3/uL (ref 3.4–10.8)

## 2019-10-07 LAB — LIPID PANEL
Chol/HDL Ratio: 2.8 ratio (ref 0.0–5.0)
Cholesterol, Total: 105 mg/dL (ref 100–199)
HDL: 37 mg/dL — ABNORMAL LOW (ref >39)
LDL Chol Calc (NIH): 52 mg/dL (ref 0–99)
Triglycerides: 76 mg/dL (ref 0–149)
VLDL Cholesterol Cal: 16 mg/dL (ref 5–40)

## 2019-10-07 LAB — COMPREHENSIVE METABOLIC PANEL
ALT: 17 IU/L (ref 0–44)
AST: 19 IU/L (ref 0–40)
Albumin/Globulin Ratio: 2.4 — ABNORMAL HIGH (ref 1.2–2.2)
Albumin: 4.3 g/dL (ref 3.7–4.7)
Alkaline Phosphatase: 83 IU/L (ref 39–117)
BUN/Creatinine Ratio: 21 (ref 10–24)
BUN: 21 mg/dL (ref 8–27)
Bilirubin Total: 0.8 mg/dL (ref 0.0–1.2)
CO2: 25 mmol/L (ref 20–29)
Calcium: 10.2 mg/dL (ref 8.6–10.2)
Chloride: 102 mmol/L (ref 96–106)
Creatinine, Ser: 1.01 mg/dL (ref 0.76–1.27)
GFR calc Af Amer: 84 mL/min/{1.73_m2} (ref >59)
GFR calc non Af Amer: 73 mL/min/{1.73_m2} (ref >59)
Globulin, Total: 1.8 g/dL (ref 1.5–4.5)
Glucose: 91 mg/dL (ref 65–99)
Potassium: 4.5 mmol/L (ref 3.5–5.2)
Sodium: 140 mmol/L (ref 134–144)
Total Protein: 6.1 g/dL (ref 6.0–8.5)

## 2019-10-07 LAB — TSH: TSH: 2.11 u[IU]/mL (ref 0.450–4.500)

## 2019-10-09 ENCOUNTER — Telehealth: Payer: Self-pay

## 2019-10-09 NOTE — Telephone Encounter (Signed)
-----   Message from Jerrol Banana., MD sent at 10/08/2019  8:50 AM EST ----- Labs good.

## 2019-10-09 NOTE — Telephone Encounter (Signed)
LVM about lab results.

## 2019-10-17 DIAGNOSIS — G4733 Obstructive sleep apnea (adult) (pediatric): Secondary | ICD-10-CM | POA: Diagnosis not present

## 2019-10-17 DIAGNOSIS — R0602 Shortness of breath: Secondary | ICD-10-CM | POA: Diagnosis not present

## 2019-10-17 LAB — PULMONARY FUNCTION TEST

## 2019-10-18 ENCOUNTER — Encounter: Payer: Self-pay | Admitting: Specialist

## 2019-10-18 DIAGNOSIS — G4733 Obstructive sleep apnea (adult) (pediatric): Secondary | ICD-10-CM | POA: Diagnosis not present

## 2019-10-18 DIAGNOSIS — R0602 Shortness of breath: Secondary | ICD-10-CM | POA: Diagnosis not present

## 2019-10-29 ENCOUNTER — Other Ambulatory Visit: Payer: Self-pay | Admitting: Family Medicine

## 2019-11-03 DIAGNOSIS — G4733 Obstructive sleep apnea (adult) (pediatric): Secondary | ICD-10-CM | POA: Diagnosis not present

## 2019-11-20 DIAGNOSIS — H2513 Age-related nuclear cataract, bilateral: Secondary | ICD-10-CM | POA: Diagnosis not present

## 2019-11-21 ENCOUNTER — Encounter (INDEPENDENT_AMBULATORY_CARE_PROVIDER_SITE_OTHER): Payer: Self-pay | Admitting: Vascular Surgery

## 2019-11-21 ENCOUNTER — Ambulatory Visit (INDEPENDENT_AMBULATORY_CARE_PROVIDER_SITE_OTHER): Payer: PPO | Admitting: Vascular Surgery

## 2019-11-21 ENCOUNTER — Other Ambulatory Visit: Payer: Self-pay

## 2019-11-21 ENCOUNTER — Ambulatory Visit (INDEPENDENT_AMBULATORY_CARE_PROVIDER_SITE_OTHER): Payer: PPO

## 2019-11-21 VITALS — BP 149/80 | HR 61 | Resp 19 | Ht 71.0 in | Wt 176.0 lb

## 2019-11-21 DIAGNOSIS — I6523 Occlusion and stenosis of bilateral carotid arteries: Secondary | ICD-10-CM | POA: Diagnosis not present

## 2019-11-21 DIAGNOSIS — I2510 Atherosclerotic heart disease of native coronary artery without angina pectoris: Secondary | ICD-10-CM

## 2019-11-21 DIAGNOSIS — E782 Mixed hyperlipidemia: Secondary | ICD-10-CM | POA: Diagnosis not present

## 2019-11-21 DIAGNOSIS — I10 Essential (primary) hypertension: Secondary | ICD-10-CM

## 2019-11-21 NOTE — Progress Notes (Signed)
MRN : [id number]  Timothy Singleton is a 74 y.o. ([birth year]) male who presents with chief complaint of  Chief Complaint  Patient presents with  . Follow-up    Carotid  .  History of Present Illness: Patient returns in follow-up of his carotid disease.  He is doing well with no focal neurologic symptoms or new complaints.  He has no stroke or TIA symptoms.  His carotid duplex today reveals 1 to 39% right ICA stenosis.  While the left ICA velocities fall in the 1 to 39% range, the distal common carotid artery has velocities in appearance that are more in the moderate range likely around 60%.  This would correlate from his previous CT scan as well.  Current Outpatient Medications  Medication Sig Dispense Refill  . aspirin 81 MG tablet Take 81 mg by mouth daily.    Marland Kitchen atorvastatin (LIPITOR) 80 MG tablet atorvastatin 80 mg tablet  TAKE 1 TABLET BY MOUTH EVERY DAY    . carbamazepine (CARBATROL) 200 MG 12 hr capsule Take 1 capsule (200 mg total) by mouth 2 (two) times daily. 60 capsule 5  . lisinopril (PRINIVIL,ZESTRIL) 20 MG tablet     . pantoprazole (PROTONIX) 40 MG tablet Take 1 tablet (40 mg total) by mouth daily. 90 tablet 1  . sertraline (ZOLOFT) 100 MG tablet TAKE 1 TABLET BY MOUTH EVERY DAY 90 tablet 1  . tamsulosin (FLOMAX) 0.4 MG CAPS capsule TAKE 1 CAPSULE BY MOUTH EVERY DAY 90 capsule 4  . amoxicillin (AMOXIL) 500 MG tablet     . metoCLOPramide (REGLAN) 10 MG tablet Take 1 tablet (10 mg total) by mouth 3 (three) times daily before meals. (Patient not taking: Reported on 10/05/2019) 90 tablet 1  . sildenafil (REVATIO) 20 MG tablet 1 to 4 tablets daily as needed (Patient not taking: Reported on 10/05/2019) 50 tablet 11   No current facility-administered medications for this visit.    Past Medical History:  Diagnosis Date  . Arthritis   . Cancer ([place])    melanoma / knee  . Depression   . GERD (gastroesophageal reflux disease)   . Hyperlipemia   . Hypertension   . Past  heart attack   . Sleep apnea     Past Surgical History:  Procedure Laterality Date  . BUNIONECTOMY    . COLONOSCOPY WITH PROPOFOL N/A 11/17/2016   Procedure: COLONOSCOPY WITH PROPOFOL;  Surgeon: Jonathon Bellows, MD;  Location: [facility];  Service: Endoscopy;  Laterality: N/A;  . CORONARY STENT PLACEMENT    . EXTRACORPOREAL SHOCK WAVE LITHOTRIPSY    . EXTRACORPOREAL SHOCK WAVE LITHOTRIPSY Right 12/30/2017   Procedure: EXTRACORPOREAL SHOCK WAVE LITHOTRIPSY (ESWL);  Surgeon: Hollice Espy, MD;  Location: [facility] OR[facility];  Service: Urology;  Laterality: Right;  . HERNIA REPAIR     inguinal-right  . JOINT REPLACEMENT     total shoulder replacement  . KNEE SURGERY Right   . TONSILLECTOMY    . TOTAL SHOULDER REPLACEMENT    . UPPER GI ENDOSCOPY  10/18/01   hiatus hernia  . VASECTOMY    . WRIST SURGERY      Social History Social History   Tobacco Use  . Smoking status: Former Research scientist (life sciences)  . Smokeless tobacco: Former Systems developer  . Tobacco comment: quit 40 years ago   Substance Use Topics  . Alcohol use: Yes    Alcohol/week: 0.0 standard drinks    Comment: 2-4 times per month  . Drug use: No    Family History  Problem Relation Age of Onset  . Cancer Mother   . Dementia Mother   . Stroke Father   . Heart disease Father   . Hypertension Father   . Breast cancer Sister   . Parkinson's disease Brother   . Hypertension Brother   . Melanoma Maternal Grandmother   . Bladder Cancer Neg Hx   . Prostate cancer Neg Hx   . Kidney cancer Neg Hx      No Known Allergies   REVIEW OF SYSTEMS(Negative unless checked)  Constitutional: [] ?Weight loss[] ?Fever[] ?Chills Cardiac:[] ?Chest pain[] ?Chest pressure[] ?Palpitations [] ?Shortness of breath when laying flat [] ?Shortness of breath at rest [x] ?Shortness of breath with exertion. Vascular: [] ?Pain in legs with walking[] ?Pain in legsat rest[] ?Pain in legs when laying flat [] ?Claudication [] ?Pain in feet when walking  [] ?Pain in feet at rest [] ?Pain in feet when laying flat [] ?History of DVT [] ?Phlebitis [] ?Swelling in legs [] ?Varicose veins [] ?Non-healing ulcers Pulmonary: [] ?Uses home oxygen [] ?Productive cough[] ?Hemoptysis [] ?Wheeze [] ?COPD [] ?Asthma Neurologic: [x] ?Dizziness [] ?Blackouts [] ?Seizures [] ?History of stroke [] ?History of TIA[x] ?Aphasia [] ?Temporary blindness[] ?Dysphagia [] ?Weaknessor numbness in arms [] ?Weakness or numbnessin legs Musculoskeletal: [x] ?Arthritis [] ?Joint swelling [] ?Joint pain [] ?Low back pain Hematologic:[] ?Easy bruising[] ?Easy bleeding [] ?Hypercoagulable state [] ?Anemic [] ?Hepatitis Gastrointestinal:[] ?Blood in stool[] ?Vomiting blood[x] ?Gastroesophageal reflux/heartburn[] ?Abdominal pain Genitourinary: [] ?Chronic kidney disease [] ?Difficulturination [] ?Frequenturination [] ?Burning with urination[] ?Hematuria Skin: [] ?Rashes [] ?Ulcers [] ?Wounds Psychological: [] ?History of anxiety[] ?History of major depression.  Physical Examination  Vitals:   11/21/19 0907  BP: (!) 149/80  Pulse: 61  Resp: 19  Weight: 176 lb (79.8 kg)  Height: 5\' 11"  (1.803 m)   Body mass index is 24.55 kg/m. Gen:  WD/WN, NAD.  Appears younger than stated age Head: [place]/AT, No temporalis wasting. Ear/Nose/Throat: Hearing grossly intact, nares w/o erythema or drainage, trachea midline Eyes: Conjunctiva clear. Sclera non-icteric Neck: Supple.  Left carotid bruit  Pulmonary:  Good air movement, equal and clear to auscultation bilaterally.  Cardiac: RRR, No JVD Vascular:  Vessel Right Left  Radial Palpable Palpable       Musculoskeletal: M/S 5/5 throughout.  No deformity or atrophy.  No edema. Neurologic: CN 2-12 intact. Sensation grossly intact in extremities.  Symmetrical.  Speech is fluent. Motor exam as listed above. Psychiatric: Judgment intact, Mood & affect appropriate for pt's clinical  situation. Dermatologic: No rashes or ulcers noted.  No cellulitis or open wounds.      CBC Lab Results  Component Value Date   WBC 5.7 10/06/2019   HGB 14.8 10/06/2019   HCT 43.1 10/06/2019   MCV 90 10/06/2019   PLT 210 10/06/2019    BMET    Component Value Date/Time   NA 140 10/06/2019 0837   K 4.5 10/06/2019 0837   CL 102 10/06/2019 0837   CO2 25 10/06/2019 0837   GLUCOSE 91 10/06/2019 0837   GLUCOSE 99 10/04/2017 0935   BUN 21 10/06/2019 0837   CREATININE 1.01 10/06/2019 0837   CREATININE 1.18 10/04/2017 0935   CALCIUM 10.2 10/06/2019 0837   GFRNONAA 73 10/06/2019 0837   GFRNONAA 61 10/04/2017 0935   GFRAA 84 10/06/2019 0837   GFRAA 71 10/04/2017 0935   CrCl cannot be calculated (Patient's most recent lab result is older than the maximum 21 days allowed.).  COAG No results found for: INR, PROTIME  Radiology No results found.   Assessment/Plan Essential (primary) hypertension blood pressure control important in reducing the progression of atherosclerotic disease. On appropriate oral medications.   Combined fat and carbohydrate induced hyperlipemia lipid control important in reducing the progression of atherosclerotic disease. Continue statin therapy   CAD in native  artery If we end up doing surgery, we will ask his cardiologist to assess his surgical risk.  Carotid stenosis His carotid duplex today reveals 1 to 39% right ICA stenosis.  While the left ICA velocities fall in the 1 to 39% range, the distal common carotid artery has velocities in appearance that are more in the moderate range likely around 60%.  This would correlate from his previous CT scan as well. Patient will continue on aspirin and high-dose statin therapy.  No role for intervention at this level.  We will follow him in 6 months with carotid duplex.    Leotis Pain, MD  11/21/2019 10:07 AM    This note was created with Dragon medical transcription system.  Any errors from  dictation are purely unintentional

## 2019-11-21 NOTE — Assessment & Plan Note (Signed)
His carotid duplex today reveals 1 to 39% right ICA stenosis.  While the left ICA velocities fall in the 1 to 39% range, the distal common carotid artery has velocities in appearance that are more in the moderate range likely around 60%.  This would correlate from his previous CT scan as well. Patient will continue on aspirin and high-dose statin therapy.  No role for intervention at this level.  We will follow him in 6 months with carotid duplex.

## 2019-12-01 ENCOUNTER — Other Ambulatory Visit: Payer: Self-pay | Admitting: *Deleted

## 2019-12-01 DIAGNOSIS — G4733 Obstructive sleep apnea (adult) (pediatric): Secondary | ICD-10-CM

## 2019-12-04 DIAGNOSIS — G4733 Obstructive sleep apnea (adult) (pediatric): Secondary | ICD-10-CM | POA: Diagnosis not present

## 2019-12-29 DIAGNOSIS — Z85820 Personal history of malignant melanoma of skin: Secondary | ICD-10-CM | POA: Diagnosis not present

## 2019-12-29 DIAGNOSIS — L821 Other seborrheic keratosis: Secondary | ICD-10-CM | POA: Diagnosis not present

## 2019-12-29 DIAGNOSIS — D2262 Melanocytic nevi of left upper limb, including shoulder: Secondary | ICD-10-CM | POA: Diagnosis not present

## 2019-12-29 DIAGNOSIS — D485 Neoplasm of uncertain behavior of skin: Secondary | ICD-10-CM | POA: Diagnosis not present

## 2019-12-29 DIAGNOSIS — D2271 Melanocytic nevi of right lower limb, including hip: Secondary | ICD-10-CM | POA: Diagnosis not present

## 2019-12-29 DIAGNOSIS — X32XXXA Exposure to sunlight, initial encounter: Secondary | ICD-10-CM | POA: Diagnosis not present

## 2019-12-29 DIAGNOSIS — Z85828 Personal history of other malignant neoplasm of skin: Secondary | ICD-10-CM | POA: Diagnosis not present

## 2019-12-29 DIAGNOSIS — L570 Actinic keratosis: Secondary | ICD-10-CM | POA: Diagnosis not present

## 2020-01-01 DIAGNOSIS — G4733 Obstructive sleep apnea (adult) (pediatric): Secondary | ICD-10-CM | POA: Diagnosis not present

## 2020-01-02 NOTE — Progress Notes (Signed)
Patient: Timothy Singleton Male    DOB: 1946/05/27   74 y.o.   MRN: [id number] Visit Date: 01/03/2020  Today's Provider: Wilhemena Durie, MD   Chief Complaint  Patient presents with  . Hypertension  . Hyperlipidemia  . Gastroesophageal Reflux  . Sleep Apnea   Subjective:     HPI  Patient feels well.  He has had both Covid shots.  His dermatologist thought last week that he might have some jaundice.  He is here today for that.  He states he wears a CPAP nightly and it helps a lot.  He states his reflux disease is much better with Protonix. Essential (primary) hypertension From 10/05/2019-labs checked showing-good.  Other hyperlipidemia From 10/05/2019-labs checked showing-good.  Arteriosclerosis of coronary artery From 10/05/2019-All risk factors treated.  OSA (obstructive sleep apnea) From 10/05/2019-Patient has history of OSA by sleep study.  Was unable to tolerate CPAP.  We will try again. Epworth sleep scale --5 obtained Home sleep test.   Gastroesophageal reflux disease, unspecified whether esophagitis present From 10/05/2019-May need GI referral if dysphagia continues.  I think PPI will calm it down. Given rx for pantoprazole (PROTONIX) 40 mg.   No Known Allergies   Current Outpatient Medications:  .  amoxicillin (AMOXIL) 500 MG tablet, , Disp: , Rfl:  .  aspirin 81 MG tablet, Take 81 mg by mouth daily., Disp: , Rfl:  .  atorvastatin (LIPITOR) 80 MG tablet, atorvastatin 80 mg tablet  TAKE 1 TABLET BY MOUTH EVERY DAY, Disp: , Rfl:  .  carbamazepine (CARBATROL) 200 MG 12 hr capsule, Take 1 capsule (200 mg total) by mouth 2 (two) times daily., Disp: 60 capsule, Rfl: 5 .  lisinopril (PRINIVIL,ZESTRIL) 20 MG tablet, , Disp: , Rfl:  .  metoCLOPramide (REGLAN) 10 MG tablet, Take 1 tablet (10 mg total) by mouth 3 (three) times daily before meals. (Patient not taking: Reported on 10/05/2019), Disp: 90 tablet, Rfl: 1 .  pantoprazole (PROTONIX) 40 MG tablet,  Take 1 tablet (40 mg total) by mouth daily., Disp: 90 tablet, Rfl: 1 .  sertraline (ZOLOFT) 100 MG tablet, TAKE 1 TABLET BY MOUTH EVERY DAY, Disp: 90 tablet, Rfl: 1 .  sildenafil (REVATIO) 20 MG tablet, 1 to 4 tablets daily as needed (Patient not taking: Reported on 10/05/2019), Disp: 50 tablet, Rfl: 11 .  tamsulosin (FLOMAX) 0.4 MG CAPS capsule, TAKE 1 CAPSULE BY MOUTH EVERY DAY, Disp: 90 capsule, Rfl: 4  Review of Systems  Constitutional: Negative for appetite change, chills and fever.  HENT: Negative.   Eyes: Negative.   Respiratory: Negative for chest tightness, shortness of breath and wheezing.   Cardiovascular: Negative for chest pain and palpitations.  Gastrointestinal: Negative for abdominal pain, nausea and vomiting.  Endocrine: Negative.   Musculoskeletal: Positive for arthralgias.  Allergic/Immunologic: Negative.   Neurological: Negative.   Hematological: Negative.     Social History   Tobacco Use  . Smoking status: Former Research scientist (life sciences)  . Smokeless tobacco: Former Systems developer  . Tobacco comment: quit 40 years ago   Substance Use Topics  . Alcohol use: Yes    Alcohol/week: 0.0 standard drinks    Comment: 2-4 times per month      Objective:   There were no vitals taken for this visit. There were no vitals filed for this visit.There is no height or weight on file to calculate BMI.   Physical Exam Vitals reviewed.  Constitutional:      Appearance: He is well-developed.  HENT:     Head: Normocephalic and atraumatic.     Right Ear: External ear normal.     Left Ear: External ear normal.     Nose: Nose normal.  Eyes:     General: No scleral icterus.    Conjunctiva/sclera: Conjunctivae normal.     Pupils: Pupils are equal, round, and reactive to light.  Neck:     Thyroid: No thyromegaly.  Cardiovascular:     Rate and Rhythm: Normal rate and regular rhythm.     Heart sounds: Normal heart sounds.  Pulmonary:     Effort: Pulmonary effort is normal.     Breath sounds:  Normal breath sounds.  Abdominal:     Palpations: Abdomen is soft.  Musculoskeletal:        General: Normal range of motion.     Cervical back: Neck supple.  Lymphadenopathy:     Cervical: No cervical adenopathy.  Skin:    General: Skin is warm and dry.     Comments: Very fair skin.  Neurological:     General: No focal deficit present.     Mental Status: He is alert and oriented to person, place, and time.     Deep Tendon Reflexes: Reflexes are normal and symmetric.  Psychiatric:        Mood and Affect: Mood normal.        Behavior: Behavior normal.        Thought Content: Thought content normal.        Judgment: Judgment normal.      No results found for any visits on 01/03/20.     Assessment & Plan    1. Arteriosclerosis of coronary artery - Comprehensive metabolic panel  2. Essential (primary) hypertension On lisinopril.  6 months - Comprehensive metabolic panel  3. CAD in native artery All risk factors treated. - Comprehensive metabolic panel  4. Obstructive sleep apnea Doing well on CPAP. - Comprehensive metabolic panel  5. Diverticulosis of colon  - Comprehensive metabolic panel  6. Depression, unspecified depression type Controlled on sertraline - Comprehensive metabolic panel  7. Gastro-esophageal reflux disease without esophagitis Trolled on pantoprazole. - Comprehensive metabolic panel     Wilhemena Durie, MD  [facility]

## 2020-01-03 ENCOUNTER — Ambulatory Visit (INDEPENDENT_AMBULATORY_CARE_PROVIDER_SITE_OTHER): Payer: PPO | Admitting: Family Medicine

## 2020-01-03 ENCOUNTER — Encounter: Payer: Self-pay | Admitting: Family Medicine

## 2020-01-03 ENCOUNTER — Other Ambulatory Visit: Payer: Self-pay

## 2020-01-03 VITALS — BP 130/68 | HR 55 | Temp 96.9°F | Wt 176.8 lb

## 2020-01-03 DIAGNOSIS — G4733 Obstructive sleep apnea (adult) (pediatric): Secondary | ICD-10-CM | POA: Diagnosis not present

## 2020-01-03 DIAGNOSIS — K5730 Diverticulosis of large intestine without perforation or abscess without bleeding: Secondary | ICD-10-CM

## 2020-01-03 DIAGNOSIS — F329 Major depressive disorder, single episode, unspecified: Secondary | ICD-10-CM | POA: Diagnosis not present

## 2020-01-03 DIAGNOSIS — I10 Essential (primary) hypertension: Secondary | ICD-10-CM

## 2020-01-03 DIAGNOSIS — I2510 Atherosclerotic heart disease of native coronary artery without angina pectoris: Secondary | ICD-10-CM | POA: Diagnosis not present

## 2020-01-03 DIAGNOSIS — K219 Gastro-esophageal reflux disease without esophagitis: Secondary | ICD-10-CM | POA: Diagnosis not present

## 2020-01-03 DIAGNOSIS — F32A Depression, unspecified: Secondary | ICD-10-CM

## 2020-01-04 ENCOUNTER — Telehealth: Payer: Self-pay

## 2020-01-04 LAB — COMPREHENSIVE METABOLIC PANEL
ALT: 26 IU/L (ref 0–44)
AST: 30 IU/L (ref 0–40)
Albumin/Globulin Ratio: 2 (ref 1.2–2.2)
Albumin: 4.3 g/dL (ref 3.7–4.7)
Alkaline Phosphatase: 93 IU/L (ref 39–117)
BUN/Creatinine Ratio: 9 — ABNORMAL LOW (ref 10–24)
BUN: 12 mg/dL (ref 8–27)
Bilirubin Total: 0.3 mg/dL (ref 0.0–1.2)
CO2: 26 mmol/L (ref 20–29)
Calcium: 10.3 mg/dL — ABNORMAL HIGH (ref 8.6–10.2)
Chloride: 100 mmol/L (ref 96–106)
Creatinine, Ser: 1.38 mg/dL — ABNORMAL HIGH (ref 0.76–1.27)
GFR calc Af Amer: 58 mL/min/{1.73_m2} — ABNORMAL LOW (ref >59)
GFR calc non Af Amer: 50 mL/min/{1.73_m2} — ABNORMAL LOW (ref >59)
Globulin, Total: 2.2 g/dL (ref 1.5–4.5)
Glucose: 100 mg/dL — ABNORMAL HIGH (ref 65–99)
Potassium: 5.1 mmol/L (ref 3.5–5.2)
Sodium: 139 mmol/L (ref 134–144)
Total Protein: 6.5 g/dL (ref 6.0–8.5)

## 2020-01-04 NOTE — Telephone Encounter (Signed)
Patient advised.

## 2020-01-04 NOTE — Telephone Encounter (Signed)
-----   Message from Jerrol Banana., MD sent at 01/04/2020  8:18 AM EDT ----- Kidney function very slightly worse.  Would avoid anti-inflammatory drugs and encourage water intake

## 2020-02-01 DIAGNOSIS — G4733 Obstructive sleep apnea (adult) (pediatric): Secondary | ICD-10-CM | POA: Diagnosis not present

## 2020-02-16 ENCOUNTER — Telehealth: Payer: Self-pay | Admitting: Family Medicine

## 2020-02-16 NOTE — Telephone Encounter (Signed)
Left message for patient to schedule Annual Wellness Visit.  Please schedule with Nurse Health Advisor Victoria Britt, RN at [facility]  

## 2020-03-02 DIAGNOSIS — G4733 Obstructive sleep apnea (adult) (pediatric): Secondary | ICD-10-CM | POA: Diagnosis not present

## 2020-03-04 ENCOUNTER — Other Ambulatory Visit: Payer: Self-pay | Admitting: Family Medicine

## 2020-03-04 DIAGNOSIS — G500 Trigeminal neuralgia: Secondary | ICD-10-CM

## 2020-03-04 NOTE — Telephone Encounter (Signed)
Requested medication (s) are due for refill today: yes  Requested medication (s) are on the active medication list: yes  Last refill:  02/01/20  Future visit scheduled: yes  Notes to clinic: not delegated    Requested Prescriptions  Pending Prescriptions Disp Refills   carbamazepine (CARBATROL) 200 MG 12 hr capsule [Pharmacy Med Name: CARBAMAZEPINE ER 200 MG CAP] 60 capsule 5    Sig: TAKE 1 CAPSULE BY MOUTH TWICE A DAY      Not Delegated - Neurology:  Anticonvulsants - carbamazepine Failed - 03/04/2020  8:31 AM      Failed - This refill cannot be delegated      Failed - Carbamazepine (serum) in normal range and within 360 days    No results found for: CBMZ, LABCARB        Failed - WBC in normal range and within 90 days    WBC  Date Value Ref Range Status  10/06/2019 5.7 3.4 - 10.8 x10E3/uL Final  10/04/2017 5.9 3.8 - 10.8 Thousand/uL Final          Failed - PLT in normal range and within 90 days    Platelets  Date Value Ref Range Status  10/06/2019 210 150 - 450 x10E3/uL Final          Failed - HGB in normal range and within 90 days    Hemoglobin  Date Value Ref Range Status  10/06/2019 14.8 13.0 - 17.7 g/dL Final          Failed - HCT in normal range and within 90 days    Hematocrit  Date Value Ref Range Status  10/06/2019 43.1 37.5 - 51.0 % Final          Passed - AST in normal range and within 90 days    AST  Date Value Ref Range Status  01/03/2020 30 0 - 40 IU/L Final          Passed - ALT in normal range and within 90 days    ALT  Date Value Ref Range Status  01/03/2020 26 0 - 44 IU/L Final          Passed - Na in normal range and within 90 days    Sodium  Date Value Ref Range Status  01/03/2020 139 134 - 144 mmol/L Final          Passed - Valid encounter within last 12 months    Recent Outpatient Visits           2 months ago Arteriosclerosis of coronary artery   [facility] Jerrol Banana., MD   5 months ago  Annual physical exam   [facility] Jerrol Banana., MD   1 year ago Depression, unspecified depression type   [facility] Jerrol Banana., MD   1 year ago Annual physical exam   [facility] Jerrol Banana., MD   1 year ago CAD in native artery   [facility] Jerrol Banana., MD       Future Appointments             In 4 months Jerrol Banana., MD [facility] [place]

## 2020-04-01 DIAGNOSIS — E291 Testicular hypofunction: Secondary | ICD-10-CM | POA: Diagnosis not present

## 2020-04-01 DIAGNOSIS — G479 Sleep disorder, unspecified: Secondary | ICD-10-CM | POA: Diagnosis not present

## 2020-04-01 DIAGNOSIS — M2550 Pain in unspecified joint: Secondary | ICD-10-CM | POA: Diagnosis not present

## 2020-04-02 DIAGNOSIS — G4733 Obstructive sleep apnea (adult) (pediatric): Secondary | ICD-10-CM | POA: Diagnosis not present

## 2020-04-11 ENCOUNTER — Other Ambulatory Visit: Payer: Self-pay

## 2020-04-11 DIAGNOSIS — R972 Elevated prostate specific antigen [PSA]: Secondary | ICD-10-CM | POA: Diagnosis not present

## 2020-04-12 LAB — PSA: Prostate Specific Ag, Serum: 2.1 ng/mL (ref 0.0–4.0)

## 2020-04-14 ENCOUNTER — Other Ambulatory Visit: Payer: Self-pay | Admitting: Family Medicine

## 2020-04-14 NOTE — Telephone Encounter (Signed)
Requested Prescriptions  Pending Prescriptions Disp Refills  . sertraline (ZOLOFT) 100 MG tablet [Pharmacy Med Name: SERTRALINE HCL 100 MG TABLET] 90 tablet 1    Sig: TAKE 1 TABLET BY MOUTH EVERY DAY     Psychiatry:  Antidepressants - SSRI Passed - 04/14/2020 12:57 AM      Passed - Completed PHQ-2 or PHQ-9 in the last 360 days.      Passed - Valid encounter within last 6 months    Recent Outpatient Visits          3 months ago Arteriosclerosis of coronary artery   [facility] Jerrol Banana., MD   6 months ago Annual physical exam   [facility] Jerrol Banana., MD   1 year ago Depression, unspecified depression type   [facility] Jerrol Banana., MD   1 year ago Annual physical exam   [facility] Jerrol Banana., MD   2 years ago CAD in native artery   [facility] Jerrol Banana., MD      Future Appointments            In 1 week Hollice Espy, MD [facility]   In 2 months Jerrol Banana., MD [facility] [place]

## 2020-04-17 ENCOUNTER — Other Ambulatory Visit: Payer: Self-pay | Admitting: *Deleted

## 2020-04-17 DIAGNOSIS — N200 Calculus of kidney: Secondary | ICD-10-CM

## 2020-04-23 NOTE — Progress Notes (Signed)
04/24/2020 4:16 PM   Timothy Singleton 1946/10/12 [id number]  Referring provider: Jerrol Banana., MD [address] [place],  [place] [address] Chief Complaint  Patient presents with  . Nephrolithiasis    HPI: Timothy Singleton is a 74 y.o. male with a history of nephrolithiasis, hypogonadism and rising PSA who presents today for routine follow-up.  He was last seen in 2019 and returns today to reestablish care.    He has a  personal history of nephrolithiasis.  Most recently, he is s/pESWL for a 15 mm right nonobstructing calculuson 12/30/17. Follow up demonstrated fragmentation of the stone with a dominant 7 millimeter fragment on the right and punctate stone on the left.   KUB today showed right 8 mm stone stable in size, small left stone stable in size without much interval change from 2019.    No flank pain or gross hematuria.  No interval stone episodes.  He is under treatment for hypogonadism managed by a private clinic, [facility] which is not covered by his insurance. This is improved his sex life dramatically as well as his energy level. He is using Testopel pellets with last implant a few months ago and will be due again soon. He is now very interested interested in transferring his care to [place] for this. He did obtain records for [place] with previous testosterone values but none of which appeared to be his initial baseline testosterone level without documentation of hypogonadal state.  This is unchanged from last visit.  He is pleased with the results of initiation of this therapy.  PSA was 2.1 on 04/11/2020 (stable).  He has a new grandchild and is excited about another grandchild on the way.   He is voiding well. He has a weak stream occasionally but this is not bothersome.  He notes a good sex drive secondary to testosterone therapy outside clinic. He uses sildenafil as needed.     PMH: Past Medical History:  Diagnosis Date  . Arthritis     . Cancer ([place])    melanoma / knee  . Depression   . GERD (gastroesophageal reflux disease)   . Hyperlipemia   . Hypertension   . Past heart attack   . Sleep apnea     Surgical History: Past Surgical History:  Procedure Laterality Date  . BUNIONECTOMY    . COLONOSCOPY WITH PROPOFOL N/A 11/17/2016   Procedure: COLONOSCOPY WITH PROPOFOL;  Surgeon: Jonathon Bellows, MD;  Location: [facility] ENDOSCOPY;  Service: Endoscopy;  Laterality: N/A;  . CORONARY STENT PLACEMENT    . EXTRACORPOREAL SHOCK WAVE LITHOTRIPSY    . EXTRACORPOREAL SHOCK WAVE LITHOTRIPSY Right 12/30/2017   Procedure: EXTRACORPOREAL SHOCK WAVE LITHOTRIPSY (ESWL);  Surgeon: Hollice Espy, MD;  Location: [facility] ORS;  Service: Urology;  Laterality: Right;  . HERNIA REPAIR     inguinal-right  . JOINT REPLACEMENT     total shoulder replacement  . KNEE SURGERY Right   . TONSILLECTOMY    . TOTAL SHOULDER REPLACEMENT    . UPPER GI ENDOSCOPY  10/18/01   hiatus hernia  . VASECTOMY    . WRIST SURGERY      Home Medications:  Allergies as of 04/24/2020   No Known Allergies     Medication List       Accurate as of April 24, 2020 11:59 PM. If you have any questions, ask your nurse or doctor.        STOP taking these medications   metoCLOPramide 10 MG  tablet Commonly known as: Reglan Stopped by: Hollice Espy, MD   sildenafil 20 MG tablet Commonly known as: REVATIO Stopped by: Hollice Espy, MD     TAKE these medications   amoxicillin 500 MG capsule Commonly known as: AMOXIL SMARTSIG:4 Capsule(s) By Mouth Once What changed: Another medication with the same name was removed. Continue taking this medication, and follow the directions you see here. Changed by: Hollice Espy, MD   anastrozole 1 MG tablet Commonly known as: ARIMIDEX Take 1 mg by mouth every 14 (fourteen) days.   aspirin 81 MG tablet Take 81 mg by mouth daily.   atorvastatin 80 MG tablet Commonly known as: LIPITOR atorvastatin 80 mg tablet  TAKE 1  TABLET BY MOUTH EVERY DAY   carbamazepine 200 MG 12 hr capsule Commonly known as: CARBATROL TAKE 1 CAPSULE BY MOUTH TWICE A DAY   lisinopril 20 MG tablet Commonly known as: ZESTRIL   pantoprazole 40 MG tablet Commonly known as: PROTONIX Take 1 tablet (40 mg total) by mouth daily.   sertraline 100 MG tablet Commonly known as: ZOLOFT TAKE 1 TABLET BY MOUTH EVERY DAY   tamsulosin 0.4 MG Caps capsule Commonly known as: FLOMAX TAKE 1 CAPSULE BY MOUTH EVERY DAY       Allergies: No Known Allergies  Family History: Family History  Problem Relation Age of Onset  . Cancer Mother   . Dementia Mother   . Stroke Father   . Heart disease Father   . Hypertension Father   . Breast cancer Sister   . Parkinson's disease Brother   . Hypertension Brother   . Melanoma Maternal Grandmother   . Bladder Cancer Neg Hx   . Prostate cancer Neg Hx   . Kidney cancer Neg Hx     Social History:  reports that he has quit smoking. He has quit using smokeless tobacco. He reports current alcohol use. He reports that he does not use drugs. He has a new grandchild and is excited about another grandchild on the way.    Physical Exam: BP 129/69   Pulse 65   Ht 5\' 11"  (1.803 m)   Wt 170 lb (77.1 kg)   BMI 23.71 kg/m   Constitutional:  Alert and oriented, No acute distress. HEENT: [place] AT, moist mucus membranes.  Trachea midline, no masses. Cardiovascular: No clubbing, cyanosis, or edema. Respiratory: Normal respiratory effort, no increased work of breathing. Skin: No rashes, bruises or suspicious lesions. Neurologic: Grossly intact, no focal deficits, moving all 4 extremities. Psychiatric: Normal mood and affect.  Laboratory Data:  Lab Results  Component Value Date   CREATININE 1.38 (H) 01/03/2020   Pertinent image  KUB final read pending.  Not much interval change per my read.    I have personally reviewed the images and agree with radiologist interpretation.    Assessment & Plan:     1. Kidney stone Continue conservative management, not interested in any further surgical procedures at this time. KUB today showed right 8 mm stone stable in size, small left stone stable in size and unchanged from 2019.  Reviewed stone diet recommendations. Will continue to follow annually with KUB  2. Hypogonadism in male Managed by private clinic, cash pay It is unclear whether or not he was truly hypogonadal at the time of initiation We discussed the risks of testosterone therapy at length today including potential cardiovascular, polycythemia, and malignant potential I continue to encourage him to try to obtain records to prove that he was truly hypogonadal  with initiation of treatment, then will feel comfortable continuing treatment under my care I personally see no documentation regarding low testosterone. Patient will provide documents from outside clinic about original testosterone. If not, we can recheck our own labs in a few months to assess/ document I discussed treatment options including injections vs testopel implants if he does qualify Will check testosterone in 2 months upon patient request.  Patient will work on insurance coverage thereafter  3. Rising PSA level Initial bump in PSA after initiation of testosterone therapy by outside clinic. PSA has stabilized at 2.1 as of 04/11/2020.  Return in 2 months with Zara Council, PA-C to further discussion testopel implants and testosterone levels.  [place] [facility] [address] [place], [place] [address] [phone]  I, Selena Batten, am acting as a scribe for Dr. Hollice Espy.  I have reviewed the above documentation for accuracy and completeness, and I agree with the above.   Hollice Espy, MD  I spent 40 total minutes on the day of the encounter including pre-visit review of the medical record, face-to-face time with the patient, and post visit ordering of  labs/imaging/tests.

## 2020-04-24 ENCOUNTER — Ambulatory Visit
Admission: RE | Admit: 2020-04-24 | Discharge: 2020-04-24 | Disposition: A | Discharge status: Home / Self Care | Payer: PPO | Attending: Urology | Admitting: Urology

## 2020-04-24 ENCOUNTER — Other Ambulatory Visit: Payer: Self-pay

## 2020-04-24 ENCOUNTER — Ambulatory Visit: Payer: PPO | Admitting: Urology

## 2020-04-24 ENCOUNTER — Ambulatory Visit
Admission: RE | Admit: 2020-04-24 | Discharge: 2020-04-24 | Disposition: A | Discharge status: Home / Self Care | Payer: PPO | Source: Ambulatory Visit | Attending: Urology | Admitting: Urology

## 2020-04-24 VITALS — BP 129/69 | HR 65 | Ht 71.0 in | Wt 170.0 lb

## 2020-04-24 DIAGNOSIS — N200 Calculus of kidney: Secondary | ICD-10-CM | POA: Insufficient documentation

## 2020-04-24 DIAGNOSIS — I878 Other specified disorders of veins: Secondary | ICD-10-CM | POA: Diagnosis not present

## 2020-04-24 DIAGNOSIS — E291 Testicular hypofunction: Secondary | ICD-10-CM

## 2020-04-24 DIAGNOSIS — M160 Bilateral primary osteoarthritis of hip: Secondary | ICD-10-CM | POA: Diagnosis not present

## 2020-05-02 DIAGNOSIS — G4733 Obstructive sleep apnea (adult) (pediatric): Secondary | ICD-10-CM | POA: Diagnosis not present

## 2020-05-13 ENCOUNTER — Telehealth: Payer: Self-pay | Admitting: Family Medicine

## 2020-05-13 NOTE — Telephone Encounter (Signed)
Copied from [place] [id number]. Topic: Medicare AWV >> May 13, 2020  1:50 PM Cher Nakai R wrote: Reason for CRM:  Left message for patient to call back and schedule Medicare Annual Wellness Visit (AWV) either virtually or in office.  Last AWV 11/13/2016  Please schedule at anytime with [facility] Health Advisor.

## 2020-05-21 ENCOUNTER — Ambulatory Visit (INDEPENDENT_AMBULATORY_CARE_PROVIDER_SITE_OTHER): Payer: PPO | Admitting: Vascular Surgery

## 2020-05-21 ENCOUNTER — Encounter (INDEPENDENT_AMBULATORY_CARE_PROVIDER_SITE_OTHER): Payer: PPO

## 2020-06-02 DIAGNOSIS — G4733 Obstructive sleep apnea (adult) (pediatric): Secondary | ICD-10-CM | POA: Diagnosis not present

## 2020-06-04 ENCOUNTER — Encounter (INDEPENDENT_AMBULATORY_CARE_PROVIDER_SITE_OTHER): Payer: PPO

## 2020-06-04 ENCOUNTER — Ambulatory Visit (INDEPENDENT_AMBULATORY_CARE_PROVIDER_SITE_OTHER): Payer: PPO | Admitting: Vascular Surgery

## 2020-06-07 ENCOUNTER — Other Ambulatory Visit: Payer: Self-pay

## 2020-06-07 ENCOUNTER — Encounter (INDEPENDENT_AMBULATORY_CARE_PROVIDER_SITE_OTHER): Payer: Self-pay | Admitting: Vascular Surgery

## 2020-06-07 ENCOUNTER — Ambulatory Visit (INDEPENDENT_AMBULATORY_CARE_PROVIDER_SITE_OTHER): Payer: PPO

## 2020-06-07 ENCOUNTER — Ambulatory Visit (INDEPENDENT_AMBULATORY_CARE_PROVIDER_SITE_OTHER): Payer: PPO | Admitting: Vascular Surgery

## 2020-06-07 VITALS — BP 134/70 | HR 54 | Ht 71.0 in | Wt 173.0 lb

## 2020-06-07 DIAGNOSIS — I6523 Occlusion and stenosis of bilateral carotid arteries: Secondary | ICD-10-CM | POA: Diagnosis not present

## 2020-06-07 DIAGNOSIS — I2510 Atherosclerotic heart disease of native coronary artery without angina pectoris: Secondary | ICD-10-CM | POA: Diagnosis not present

## 2020-06-07 DIAGNOSIS — I10 Essential (primary) hypertension: Secondary | ICD-10-CM

## 2020-06-07 DIAGNOSIS — E782 Mixed hyperlipidemia: Secondary | ICD-10-CM | POA: Diagnosis not present

## 2020-06-07 NOTE — Progress Notes (Signed)
MRN : [id number]  Timothy Singleton is a 74 y.o. (1946/09/30) male who presents with chief complaint of  Chief Complaint  Patient presents with  . Follow-up    43mo U/S follow up  .  History of Present Illness: Patient returns in follow-up of his carotid disease.  He is doing well today without any specific complaints.  He denies any focal neurologic symptoms. Specifically, the patient denies amaurosis fugax, speech or swallowing difficulties, or arm or leg weakness or numbness. Carotid duplex today reveals stable velocities in the internal carotid artery consistent 1 to 39% stenosis bilaterally, although there are some more elevated velocities in the distal left common carotid artery associated with a more moderate degree of stenosis.  This is previously been demonstrated on the CT angiogram to be in the 60% range and the velocities have not changed, so I would estimate it in that range again.   Current Outpatient Medications  Medication Sig Dispense Refill  . aspirin 81 MG tablet Take 81 mg by mouth daily.    Marland Kitchen atorvastatin (LIPITOR) 80 MG tablet atorvastatin 80 mg tablet  TAKE 1 TABLET BY MOUTH EVERY DAY    . carbamazepine (CARBATROL) 200 MG 12 hr capsule TAKE 1 CAPSULE BY MOUTH TWICE A DAY 60 capsule 5  . lisinopril (ZESTRIL) 20 MG tablet Take 1 tablet by mouth daily.    . pantoprazole (PROTONIX) 40 MG tablet Take 1 tablet (40 mg total) by mouth daily. 90 tablet 1  . sertraline (ZOLOFT) 100 MG tablet TAKE 1 TABLET BY MOUTH EVERY DAY 90 tablet 1  . tamsulosin (FLOMAX) 0.4 MG CAPS capsule TAKE 1 CAPSULE BY MOUTH EVERY DAY 90 capsule 4  . amoxicillin (AMOXIL) 500 MG capsule SMARTSIG:4 Capsule(s) By Mouth Once    . anastrozole (ARIMIDEX) 1 MG tablet Take 1 mg by mouth every 14 (fourteen) days.    Marland Kitchen lisinopril (PRINIVIL,ZESTRIL) 20 MG tablet      No current facility-administered medications for this visit.    Past Medical History:  Diagnosis Date  . Arthritis   . Cancer ([place])     melanoma / knee  . Depression   . GERD (gastroesophageal reflux disease)   . Hyperlipemia   . Hypertension   . Past heart attack   . Sleep apnea     Past Surgical History:  Procedure Laterality Date  . BUNIONECTOMY    . COLONOSCOPY WITH PROPOFOL N/A 11/17/2016   Procedure: COLONOSCOPY WITH PROPOFOL;  Surgeon: Jonathon Bellows, MD;  Location: [facility] ENDOSCOPY;  Service: Endoscopy;  Laterality: N/A;  . CORONARY STENT PLACEMENT    . EXTRACORPOREAL SHOCK WAVE LITHOTRIPSY    . EXTRACORPOREAL SHOCK WAVE LITHOTRIPSY Right 12/30/2017   Procedure: EXTRACORPOREAL SHOCK WAVE LITHOTRIPSY (ESWL);  Surgeon: Hollice Espy, MD;  Location: [facility] ORS;  Service: Urology;  Laterality: Right;  . HERNIA REPAIR     inguinal-right  . JOINT REPLACEMENT     total shoulder replacement  . KNEE SURGERY Right   . TONSILLECTOMY    . TOTAL SHOULDER REPLACEMENT    . UPPER GI ENDOSCOPY  10/18/01   hiatus hernia  . VASECTOMY    . WRIST SURGERY       Social History   Tobacco Use  . Smoking status: Former Research scientist (life sciences)  . Smokeless tobacco: Former Systems developer  . Tobacco comment: quit 40 years ago   Substance Use Topics  . Alcohol use: Yes    Alcohol/week: 0.0 standard drinks    Comment: 2-4 times per month  .  Drug use: No     Family History  Problem Relation Age of Onset  . Cancer Mother   . Dementia Mother   . Stroke Father   . Heart disease Father   . Hypertension Father   . Breast cancer Sister   . Parkinson's disease Brother   . Hypertension Brother   . Melanoma Maternal Grandmother   . Bladder Cancer Neg Hx   . Prostate cancer Neg Hx   . Kidney cancer Neg Hx     No Known Allergies  REVIEW OF SYSTEMS(Negative unless checked)  Constitutional: [] ??Weight loss[] ??Fever[] ??Chills Cardiac:[] ??Chest pain[] ??Chest pressure[] ??Palpitations [] ??Shortness of breath when laying flat [] ??Shortness of breath at rest [x] ??Shortness of breath with exertion. Vascular: [] ??Pain in legs with  walking[] ??Pain in legsat rest[] ??Pain in legs when laying flat [] ??Claudication [] ??Pain in feet when walking [] ??Pain in feet at rest [] ??Pain in feet when laying flat [] ??History of DVT [] ??Phlebitis [] ??Swelling in legs [] ??Varicose veins [] ??Non-healing ulcers Pulmonary: [] ??Uses home oxygen [] ??Productive cough[] ??Hemoptysis [] ??Wheeze [] ??COPD [] ??Asthma Neurologic: [x] ??Dizziness [] ??Blackouts [] ??Seizures [] ??History of stroke [] ??History of TIA[x] ??Aphasia [] ??Temporary blindness[] ??Dysphagia [] ??Weaknessor numbness in arms [] ??Weakness or numbnessin legs Musculoskeletal: [x] ??Arthritis [] ??Joint swelling [] ??Joint pain [] ??Low back pain Hematologic:[] ??Easy bruising[] ??Easy bleeding [] ??Hypercoagulable state [] ??Anemic [] ??Hepatitis Gastrointestinal:[] ??Blood in stool[] ??Vomiting blood[x] ??Gastroesophageal reflux/heartburn[] ??Abdominal pain Genitourinary: [] ??Chronic kidney disease [] ??Difficulturination [] ??Frequenturination [] ??Burning with urination[] ??Hematuria Skin: [] ??Rashes [] ??Ulcers [] ??Wounds Psychological: [] ??History of anxiety[] ??History of major depression.  Physical Examination  Vitals:   06/07/20 0821  BP: 134/70  Pulse: (!) 54  Weight: 173 lb (78.5 kg)  Height: 5\' 11"  (1.803 m)   Body mass index is 24.13 kg/m. Gen:  WD/WN, NAD. Appears younger than stated age. Head: [place]/AT, No temporalis wasting. Ear/Nose/Throat: Hearing grossly intact, nares w/o erythema or drainage, trachea midline Eyes: Conjunctiva clear. Sclera non-icteric Neck: Supple.  Soft left carotid bruit  Pulmonary:  Good air movement, equal and clear to auscultation bilaterally.  Cardiac: RRR, No JVD Vascular:  Vessel Right Left  Radial Palpable Palpable               Musculoskeletal: M/S 5/5 throughout.  No deformity or atrophy. No edema. Neurologic: CN 2-12 intact. Sensation grossly intact  in extremities.  Symmetrical.  Speech is fluent. Motor exam as listed above. Psychiatric: Judgment intact, Mood & affect appropriate for pt's clinical situation. Dermatologic: No rashes or ulcers noted.  No cellulitis or open wounds.      CBC Lab Results  Component Value Date   WBC 5.7 10/06/2019   HGB 14.8 10/06/2019   HCT 43.1 10/06/2019   MCV 90 10/06/2019   PLT 210 10/06/2019    BMET    Component Value Date/Time   NA 139 01/03/2020 0918   K 5.1 01/03/2020 0918   CL 100 01/03/2020 0918   CO2 26 01/03/2020 0918   GLUCOSE 100 (H) 01/03/2020 0918   GLUCOSE 99 10/04/2017 0935   BUN 12 01/03/2020 0918   CREATININE 1.38 (H) 01/03/2020 0918   CREATININE 1.18 10/04/2017 0935   CALCIUM 10.3 (H) 01/03/2020 0918   GFRNONAA 50 (L) 01/03/2020 0918   GFRNONAA 61 10/04/2017 0935   GFRAA 58 (L) 01/03/2020 0918   GFRAA 71 10/04/2017 0935   CrCl cannot be calculated (Patient's most recent lab result is older than the maximum 21 days allowed.).  COAG No results found for: INR, PROTIME  Radiology No results found.   Assessment/Plan Essential (primary) hypertension blood pressure control important in reducing the progression of atherosclerotic disease. On appropriate oral medications.   Combined fat and carbohydrate induced hyperlipemia lipid  control important in reducing the progression of atherosclerotic disease. Continue statin therapy   CAD in native artery If we end up doing surgery, we will ask his cardiologist to assess his surgical risk.  CAD in native artery Carotid duplex today reveals stable velocities in the internal carotid artery consistent 1 to 39% stenosis bilaterally, although there are some more elevated velocities in the distal left common carotid artery associated with a more moderate degree of stenosis.  This is previously been demonstrated on the CT angiogram to be in the 60% range and the velocities have not changed, so I would estimate it in that  range again.  No role for intervention.  Doing well with medical management.  We will continue follow-up interval 6 months with duplex at this time.    Leotis Pain, MD  06/07/2020 8:47 AM    This note was created with Dragon medical transcription system.  Any errors from dictation are purely unintentional

## 2020-06-07 NOTE — Assessment & Plan Note (Signed)
Carotid duplex today reveals stable velocities in the internal carotid artery consistent 1 to 39% stenosis bilaterally, although there are some more elevated velocities in the distal left common carotid artery associated with a more moderate degree of stenosis.  This is previously been demonstrated on the CT angiogram to be in the 60% range and the velocities have not changed, so I would estimate it in that range again.  No role for intervention.  Doing well with medical management.  We will continue follow-up interval 6 months with duplex at this time.

## 2020-06-07 NOTE — Patient Instructions (Signed)
Carotid Artery Disease  Carotid artery disease is the narrowing or blockage of one or both carotid arteries. This condition is also called carotid artery stenosis. The carotid arteries are the two main blood vessels on either side of the neck. They send blood to the brain, other parts of the head, and the neck.  This condition increases your risk for a stroke or a transient ischemic attack (TIA). A TIA is a "mini-stroke" that causes stroke-like symptoms that go away quickly. What are the causes? This condition is mainly caused by a narrowing and hardening of the carotid arteries. The carotid arteries can become narrow or clogged with a buildup of plaque. Plaque includes:  Fat.  Cholesterol.  Calcium.  Other substances. What increases the risk? The following factors may make you more likely to develop this condition:  Having certain medical conditions, such as: ? High cholesterol. ? High blood pressure. ? Diabetes. ? Obesity.  Smoking.  A family history of cardiovascular disease.  Not being active or lack of regular exercise.  Being male. Men have a higher risk of having arteries become narrow and harden earlier in life than women.  Old age. What are the signs or symptoms? This condition may not have any signs or symptoms until a stroke or TIA happens. In some cases, your doctor may be able to hear a whooshing sound. This can suggest a change in blood flow caused by plaque buildup. An eye exam can also help find signs of the condition. How is this treated? This condition may be treated with more than one treatment. Treatment options include:  Lifestyle changes, such as: ? Quitting smoking. ? Getting regular exercise, or getting exercise as told by your doctor. ? Eating a healthy diet. ? Managing stress. ? Keeping a healthy weight.  Medicines to control: ? Blood pressure. ? Cholesterol. ? Blood clotting.  Surgery. You may have: ? A surgery to remove the blockages in  the carotid arteries. ? A procedure in which a small mesh tube (stent) is used to widen the blocked carotid arteries. Follow these instructions at home: Eating and drinking Follow instructions about your diet from your doctor. It is important to follow a healthy diet.  Eat a diet that includes: ? A lot of fresh fruits and vegetables. ? Low-fat (lean) meats.  Avoid these foods: ? Foods that are high in fat. ? Foods that are high in salt (sodium). ? Foods that are fried. ? Foods that are processed. ? Foods that have few good nutrients (poor nutritional value).  Lifestyle   Keep a healthy weight.  Do exercises as told by your doctor to stay active. Each week, you should get one of the following: ? At least 150 minutes of exercise that raises your heart rate and makes you sweat (moderate-intensity exercise). ? At least 75 minutes of exercise that takes a lot of effort.  Do not use any products that contain nicotine or tobacco, such as cigarettes, e-cigarettes, and chewing tobacco. If you need help quitting, ask your doctor.  Do not drink alcohol if: ? Your doctor tells you not to drink. ? You are pregnant, may be pregnant, or are planning to become pregnant.  If you drink alcohol: ? Limit how much you use to:  0-1 drink a day for women.  0-2 drinks a day for men. ? Be aware of how much alcohol is in your drink. In the [place] one drink equals one 12 oz bottle of beer (355 mL), one 5   oz glass of wine (148 mL), or one 1 oz glass of hard liquor (44 mL).  Do not use drugs.  Manage your stress. Ask your doctor for tips on how to do this. General instructions  Take over-the-counter and prescription medicines only as told by your doctor.  Keep all follow-up visits as told by your doctor. This is important. Where to find more information  American Heart Association: www.heart.org Get help right away if:  You have any signs of a stroke. "BE FAST" is an easy way to remember the  main warning signs: ? B - Balance. Signs are dizziness, sudden trouble walking, or loss of balance. ? E - Eyes. Signs are trouble seeing or a change in how you see. ? F - Face. Signs are sudden weakness or loss of feeling of the face, or the face or eyelid drooping on one side. ? A - Arms. Signs are weakness or loss of feeling in an arm. This happens suddenly and usually on one side of the body. ? S - Speech. Signs are sudden trouble speaking, slurred speech, or trouble understanding what people say. ? T - Time. Time to call emergency services. Write down what time symptoms started.  You have other signs of a stroke, such as: ? A sudden, very bad headache with no known cause. ? Feeling like you may vomit (nausea). ? Vomiting. ? A seizure. These symptoms may be an emergency. Do not wait to see if the symptoms will go away. Get medical help right away. Call your local emergency services (911 in the [place] Do not drive yourself to the hospital. Summary  The carotid arteries are blood vessels on both sides of the neck.  If these arteries get smaller or get blocked, you are more likely to have a stroke or a mini-stroke.  This condition can be treated with lifestyle changes, medicines, surgery, or a blend of these treatments.  Get help right away if you have any signs of a stroke. "BE FAST" is an easy way to remember the main warning signs of stroke. This information is not intended to replace advice given to you by your health care provider. Make sure you discuss any questions you have with your health care provider. Document Revised: 05/01/2019 Document Reviewed: 05/01/2019 Elsevier Patient Education  2020 Elsevier Inc.  

## 2020-06-09 ENCOUNTER — Other Ambulatory Visit: Payer: Self-pay | Admitting: Family Medicine

## 2020-06-09 DIAGNOSIS — K219 Gastro-esophageal reflux disease without esophagitis: Secondary | ICD-10-CM

## 2020-06-09 NOTE — Telephone Encounter (Signed)
Requested Prescriptions  Pending Prescriptions Disp Refills  . pantoprazole (PROTONIX) 40 MG tablet [Pharmacy Med Name: PANTOPRAZOLE SOD DR 40 MG TAB] 90 tablet 1    Sig: TAKE 1 TABLET BY MOUTH EVERY DAY     Gastroenterology: Proton Pump Inhibitors Passed - 06/09/2020 12:52 AM      Passed - Valid encounter within last 12 months    Recent Outpatient Visits          5 months ago Arteriosclerosis of coronary artery   [facility] Timothy Singleton., MD   8 months ago Annual physical exam   [facility] Timothy Singleton., MD   1 year ago Depression, unspecified depression type   [facility] Timothy Singleton., MD   1 year ago Annual physical exam   [facility] Timothy Singleton., MD   2 years ago CAD in native artery   [facility] Timothy Singleton., MD      Future Appointments            In 3 weeks [place], [facility] Payment [place]   In 4 weeks Timothy Singleton., MD [facility] [place]

## 2020-06-17 ENCOUNTER — Telehealth: Payer: Self-pay | Admitting: Family Medicine

## 2020-06-17 DIAGNOSIS — R109 Unspecified abdominal pain: Secondary | ICD-10-CM | POA: Diagnosis not present

## 2020-06-17 DIAGNOSIS — N132 Hydronephrosis with renal and ureteral calculous obstruction: Secondary | ICD-10-CM | POA: Diagnosis not present

## 2020-06-17 DIAGNOSIS — Z79899 Other long term (current) drug therapy: Secondary | ICD-10-CM | POA: Diagnosis not present

## 2020-06-17 DIAGNOSIS — Z87891 Personal history of nicotine dependence: Secondary | ICD-10-CM | POA: Diagnosis not present

## 2020-06-17 DIAGNOSIS — I10 Essential (primary) hypertension: Secondary | ICD-10-CM | POA: Diagnosis not present

## 2020-06-17 DIAGNOSIS — N200 Calculus of kidney: Secondary | ICD-10-CM | POA: Diagnosis not present

## 2020-06-17 NOTE — Telephone Encounter (Signed)
Copied from [place] [id number]. Topic: Medicare AWV >> Jun 17, 2020  4:22 PM Cher Nakai R wrote: Reason for CRM:  Left message for patient to call back and schedule Medicare Annual Wellness Visit (AWV) either virtually or in office.  Last AWV 11/13/2016  Please schedule at anytime with [facility] Health Advisor.  If any questions, please contact me at [phone]

## 2020-06-19 ENCOUNTER — Other Ambulatory Visit: Payer: Self-pay | Admitting: Radiology

## 2020-06-19 ENCOUNTER — Encounter: Payer: Self-pay | Admitting: Urology

## 2020-06-19 ENCOUNTER — Other Ambulatory Visit: Payer: Self-pay

## 2020-06-19 ENCOUNTER — Ambulatory Visit: Payer: PPO | Admitting: Urology

## 2020-06-19 ENCOUNTER — Ambulatory Visit
Admission: RE | Admit: 2020-06-19 | Discharge: 2020-06-19 | Disposition: A | Discharge status: Home / Self Care | Payer: PPO | Source: Ambulatory Visit | Attending: Urology | Admitting: Urology

## 2020-06-19 ENCOUNTER — Other Ambulatory Visit
Admission: RE | Admit: 2020-06-19 | Discharge: 2020-06-19 | Disposition: A | Discharge status: Home / Self Care | Payer: PPO | Source: Ambulatory Visit | Attending: Urology | Admitting: Urology

## 2020-06-19 VITALS — BP 134/66 | HR 62 | Ht 71.0 in | Wt 173.0 lb

## 2020-06-19 DIAGNOSIS — Z20822 Contact with and (suspected) exposure to covid-19: Secondary | ICD-10-CM | POA: Insufficient documentation

## 2020-06-19 DIAGNOSIS — Z01812 Encounter for preprocedural laboratory examination: Secondary | ICD-10-CM | POA: Insufficient documentation

## 2020-06-19 DIAGNOSIS — N200 Calculus of kidney: Secondary | ICD-10-CM

## 2020-06-19 DIAGNOSIS — N2889 Other specified disorders of kidney and ureter: Secondary | ICD-10-CM | POA: Diagnosis not present

## 2020-06-19 LAB — SARS CORONAVIRUS 2 (TAT 6-24 HRS): SARS Coronavirus 2: NEGATIVE

## 2020-06-19 NOTE — Progress Notes (Signed)
06/19/2020 10:31 AM   Timothy Singleton [birth year] [id number]  Referring provider: Jerrol Banana., MD [address] [place],  [place] [address] Chief Complaint  Patient presents with  . Nephrolithiasis    HPI: Timothy Singleton is a 74 y.o. male who presents today for evaluation of a kidney stone. He is accompanied by his wife today.   Patient was last seen in 04/24/2020 (see note for details). KUB showed right 8 mm stone stable in size, small left stone stable in size without much interval change from 2019.    The patient was seen at [facility] on 05/21/2020 with acute onset right flank pain. Stated he was concerned about potential kidney stone issues as he recently saw his urologist up at [facility] several weeks ago. He reported that he was told that he has a kidney stone on the right kidney and that he may actively start passing this at any time. He denied fevers, chills, nausea or vomiting.   CT A/P without contrast showed a 8.6 mm right mid ureteral calculus causing moderate right hydronephrosis. Bilateral nephrolithiasis.  KUB today shows a 9 mm right mid ureteral stone.   Patient reports right flank pain. He took aspirin yesterday. Denies hematuria, nausea, vomiting or fevers.   He has apersonal history of nephrolithiasis. Most recently, he is s/pESWL for a 15 mm right nonobstructing calculuson 12/30/17. Follow updemonstrated fragmentation of the stone with a dominant 13millimeter fragmenton the right and punctate stone on the left.   PMH: Past Medical History:  Diagnosis Date  . Arthritis   . Cancer ([place])    melanoma / knee  . Depression   . GERD (gastroesophageal reflux disease)   . Hyperlipemia   . Hypertension   . Past heart attack   . Sleep apnea     Surgical History: Past Surgical History:  Procedure Laterality Date  . BUNIONECTOMY    . COLONOSCOPY WITH PROPOFOL N/A 11/17/2016   Procedure: COLONOSCOPY WITH PROPOFOL;   Surgeon: Jonathon Bellows, MD;  Location: [facility];  Service: Endoscopy;  Laterality: N/A;  . CORONARY STENT PLACEMENT    . EXTRACORPOREAL SHOCK WAVE LITHOTRIPSY    . EXTRACORPOREAL SHOCK WAVE LITHOTRIPSY Right 12/30/2017   Procedure: EXTRACORPOREAL SHOCK WAVE LITHOTRIPSY (ESWL);  Surgeon: Hollice Espy, MD;  Location: [facility] OR[facility];  Service: Urology;  Laterality: Right;  . HERNIA REPAIR     inguinal-right  . JOINT REPLACEMENT     total shoulder replacement  . KNEE SURGERY Right   . TONSILLECTOMY    . TOTAL SHOULDER REPLACEMENT    . UPPER GI ENDOSCOPY  10/18/01   hiatus hernia  . VASECTOMY    . WRIST SURGERY      Home Medications:  Allergies as of 06/19/2020   No Known Allergies     Medication List       Accurate as of June 19, 2020 10:31 AM. If you have any questions, ask your nurse or doctor.        STOP taking these medications   amoxicillin 500 MG capsule Commonly known as: AMOXIL Stopped by: Hollice Espy, MD   anastrozole 1 MG tablet Commonly known as: ARIMIDEX Stopped by: Hollice Espy, MD     TAKE these medications   aspirin 81 MG tablet Take 81 mg by mouth daily.   atorvastatin 80 MG tablet Commonly known as: LIPITOR atorvastatin 80 mg tablet  TAKE 1 TABLET BY MOUTH EVERY DAY   carbamazepine 200 MG 12 hr  capsule Commonly known as: CARBATROL TAKE 1 CAPSULE BY MOUTH TWICE A DAY   lisinopril 20 MG tablet Commonly known as: ZESTRIL Take 1 tablet by mouth daily. What changed: Another medication with the same name was removed. Continue taking this medication, and follow the directions you see here. Changed by: Hollice Espy, MD   pantoprazole 40 MG tablet Commonly known as: PROTONIX TAKE 1 TABLET BY MOUTH EVERY DAY   sertraline 100 MG tablet Commonly known as: ZOLOFT TAKE 1 TABLET BY MOUTH EVERY DAY   tamsulosin 0.4 MG Caps capsule Commonly known as: FLOMAX TAKE 1 CAPSULE BY MOUTH EVERY DAY       Allergies: No Known Allergies  Family  History: Family History  Problem Relation Age of Onset  . Cancer Mother   . Dementia Mother   . Stroke Father   . Heart disease Father   . Hypertension Father   . Breast cancer Sister   . Parkinson's disease Brother   . Hypertension Brother   . Melanoma Maternal Grandmother   . Bladder Cancer Neg Hx   . Prostate cancer Neg Hx   . Kidney cancer Neg Hx     Social History:  reports that he has quit smoking. He has quit using smokeless tobacco. He reports current alcohol use. He reports that he does not use drugs.   Physical Exam: BP 134/66   Pulse 62   Ht 5\' 11"  (1.803 m)   Wt 173 lb (78.5 kg)   BMI 24.13 kg/m   Constitutional:  Alert and oriented, No acute distress. HEENT: [place] AT, moist mucus membranes.  Trachea midline, no masses. Cardiovascular: No clubbing, cyanosis, or edema. Respiratory: Normal respiratory effort, no increased work of breathing. Skin: No rashes, bruises or suspicious lesions. Neurologic: Grossly intact, no focal deficits, moving all 4 extremities. Psychiatric: Normal mood and affect.  Laboratory Data:  Lab Results  Component Value Date   CREATININE 1.38 (H) 01/03/2020    Urinalysis pending  Pertinent Imaging:  Dietrich Pates, MD - 06/17/2020  Formatting of this note might be different from the original.  CT SCAN OF THE ABDOMEN AND PELVIS WITHOUT IV CONTRAST:   COMPARISON: No comparison   TECHNIQUE: Utilizing automatic exposure control, helically acquired images were obtained from the lung bases through the symphysis pubis without IV contrast administration.   FINDINGS:   ABDOMEN CT WITHOUT IV CONTRAST:   Lack of iv contrast limits evaluation of the solid abdominal organs.    The lung bases are normal.   The unenhanced appearance of the liver, gallbladder, pancreas, adrenal glands is normal.   Spleen is enlarged measuring 13 cm.   At least 4 calculi are present in the right kidney. These range in size from 1 mm up to 4 mm.   A  8.6 x 5.1 mm calculus is present in the right mid ureter. It is at the level of L4-5 disc space. It causes moderate right hydroureteronephrosis.   3 nonobstructing left renal calculi are present. These range in size from 1 mm to 6 mm.   PELVIS CT WITHOUT IV CONTRAST:   No pelvic mass. No free fluid. No osseous abnormality.   Prostate is enlarged measuring 5.8 x 5.1 cm.    IMPRESSION:   8.6 MM RIGHT MID URETERAL CALCULUS CAUSING MODERATE RIGHT HYDRONEPHROSIS. BILATERAL NEPHROLITHIASIS.     I have personally reviewed the images and agree with radiologist interpretation. Disk was brought to clinic.  This was compare to KUB today, minimal progression of  mid ureteral stone   Assessment & Plan:    1. History of nephrolithiasis/right mid ureteral calculus   CT A/P without contrast showed a 8.6 mm right mid ureteral calculus causing moderate right hydronephrosis. Bilateral nephrolithiasis.  KUB today shows a 9 mm right mid ureteral stone.   Based on size and location of the stone, patient has less than ~5% of spontaneous interval stone passage over 30-day period.  Given that the stone seems to be moving in location, patient may have a higher chance than the aforementioned.  Medical expulsive therapy was discussed as an option.  We discussed various treatment options including ESWL vs. ureteroscopy, laser lithotripsy, and stent. We discussed the risks and benefits of both including bleeding, infection, damage to surrounding structures, efficacy with need for possible further intervention, and need for temporary ureteral stent.  Patient understood and elected shock wave.   He is currently on 81 mg of ASA.  Stone is just on the edge of 2 cm outside of renal shadow.  He understands if stone within shadow, will not be able to peruse ESWL tomorrow.  He is willing to try.     [facility] [address], [place], [place] [address] [phone]  I, Selena Batten, am acting as a scribe for Dr. Hollice Espy.  I have reviewed the above documentation for accuracy and completeness, and I agree with the above.   Hollice Espy, MD

## 2020-06-19 NOTE — H&P (View-Only) (Signed)
06/19/2020 10:31 AM   Timothy Singleton [birth year] [id number]  Referring provider: Jerrol Singleton., [place] Sing[place] [address] [place],  [place] [address] Chief Complaint  Patient presents with  . Nephrolithiasis    HPI: Timothy Singleton is a 74 y.o. male who presents today for evaluation of a kidney stone. He is accompanied by his wife today.   Patient was last seen in 04/24/2020 (see note for details). KUB showed right 8 mm stone stable in size, small left stone stable in size without much interval change from 2019.    The patient was seen at [facility] on 05/21/2020 with acute onset right flank pain. Stated he was concerned about potential kidney stone issues as he recently saw his urologist up at [facility] several weeks ago. He reported that he was told that he has a kidney stone on the right kidney and that he Singleton actively start passing this at any time. He denied fevers, chills, nausea or vomiting.   CT A/P without contrast showed a 8.6 mm right mid ureteral calculus causing moderate right hydronephrosis. Bilateral nephrolithiasis.  KUB today shows a 9 mm right mid ureteral stone.   Patient reports right flank pain. He took aspirin yesterday. Denies hematuria, nausea, vomiting or fevers.   He has apersonal history of nephrolithiasis. Most recently, he is s/pESWL for a 15 mm right nonobstructing calculuson 12/30/17. Follow updemonstrated fragmentation of the stone with a dominant 67millimeter fragmenton the right and punctate stone on the left.   PMH: Past Medical History:  Diagnosis Date  . Arthritis   . Cancer ([place])    melanoma / knee  . Depression   . GERD (gastroesophageal reflux disease)   . Hyperlipemia   . Hypertension   . Past heart attack   . Sleep apnea     Surgical History: Past Surgical History:  Procedure Laterality Date  . BUNIONECTOMY    . COLONOSCOPY WITH PROPOFOL N/A 11/17/2016   Procedure: COLONOSCOPY WITH PROPOFOL;   Surgeon: Timothy Bellows, Timothy Singleton;  Location: [facility] END[facility]COPY;  Service: Endoscopy;  Laterality: N/A;  . CORONARY STENT PLACEMENT    . EXTRACORPOREAL SHOCK WAVE LITHOTRIPSY    . EXTRACORPOREAL SHOCK WAVE LITHOTRIPSY Right 12/30/2017   Procedure: EXTRACORPOREAL SHOCK WAVE LITHOTRIPSY (ESWL);  Surgeon: Timothy Espy, Timothy Singleton;  Location: [facility] ORS;  Service: Urology;  Laterality: Right;  . HERNIA REPAIR     inguinal-right  . JOINT REPLACEMENT     total shoulder replacement  . KNEE SURGERY Right   . TONSILLECTOMY    . TOTAL SHOULDER REPLACEMENT    . UPPER GI ENDOSCOPY  10/18/01   hiatus hernia  . VASECTOMY    . WRIST SURGERY      Home Medications:  Allergies as of 06/19/2020   No Known Allergies     Medication List       Accurate as of June 19, 2020 10:31 AM. If you have any questions, ask your nurse or doctor.        STOP taking these medications   amoxicillin 500 MG capsule Commonly known as: AMOXIL Stopped by: Timothy Espy, Timothy Singleton   anastrozole 1 MG tablet Commonly known as: ARIMIDEX Stopped by: Timothy Espy, Timothy Singleton     TAKE these medications   aspirin 81 MG tablet Take 81 mg by mouth daily.   atorvastatin 80 MG tablet Commonly known as: LIPITOR atorvastatin 80 mg tablet  TAKE 1 TABLET BY MOUTH EVERY DAY   carbamazepine 200 MG 12 hr  capsule Commonly known as: CARBATROL TAKE 1 CAPSULE BY MOUTH TWICE A DAY   lisinopril 20 MG tablet Commonly known as: ZESTRIL Take 1 tablet by mouth daily. What changed: Another medication with the same name was removed. Continue taking this medication, and follow the directions you see here. Changed by: Timothy Espy, Timothy Singleton   pantoprazole 40 MG tablet Commonly known as: PROTONIX TAKE 1 TABLET BY MOUTH EVERY DAY   sertraline 100 MG tablet Commonly known as: ZOLOFT TAKE 1 TABLET BY MOUTH EVERY DAY   tamsulosin 0.4 MG Caps capsule Commonly known as: FLOMAX TAKE 1 CAPSULE BY MOUTH EVERY DAY       Allergies: No Known Allergies  Family  History: Family History  Problem Relation Age of Onset  . Cancer Mother   . Dementia Mother   . Stroke Father   . Heart disease Father   . Hypertension Father   . Breast cancer Sister   . Parkinson's disease Brother   . Hypertension Brother   . Melanoma Maternal Grandmother   . Bladder Cancer Neg Hx   . Prostate cancer Neg Hx   . Kidney cancer Neg Hx     Social History:  reports that he has quit smoking. He has quit using smokeless tobacco. He reports current alcohol use. He reports that he does not use drugs.   Physical Exam: BP 134/66   Pulse 62   Ht 5\' 11"  (1.803 m)   Wt 173 lb (78.5 kg)   BMI 24.13 kg/m   Constitutional:  Alert and oriented, No acute distress. HEENT: [place] AT, moist mucus membranes.  Trachea midline, no masses. Cardiovascular: No clubbing, cyanosis, or edema. Respiratory: Normal respiratory effort, no increased work of breathing. Skin: No rashes, bruises or suspicious lesions. Neurologic: Grossly intact, no focal deficits, moving all 4 extremities. Psychiatric: Normal mood and affect.  Laboratory Data:  Lab Results  Component Value Date   CREATININE 1.38 (H) 01/03/2020    Urinalysis pending  Pertinent Imaging:  Dietrich Pates, Timothy Singleton - 06/17/2020  Formatting of this note might be different from the original.  CT SCAN OF THE ABDOMEN AND PELVIS WITHOUT IV CONTRAST:   COMPARISON: No comparison   TECHNIQUE: Utilizing automatic exposure control, helically acquired images were obtained from the lung bases through the symphysis pubis without IV contrast administration.   FINDINGS:   ABDOMEN CT WITHOUT IV CONTRAST:   Lack of iv contrast limits evaluation of the solid abdominal organs.    The lung bases are normal.   The unenhanced appearance of the liver, gallbladder, pancreas, adrenal glands is normal.   Spleen is enlarged measuring 13 cm.   At least 4 calculi are present in the right kidney. These range in size from 1 mm up to 4 mm.   A  8.6 x 5.1 mm calculus is present in the right mid ureter. It is at the level of L4-5 disc space. It causes moderate right hydroureteronephrosis.   3 nonobstructing left renal calculi are present. These range in size from 1 mm to 6 mm.   PELVIS CT WITHOUT IV CONTRAST:   No pelvic mass. No free fluid. No osseous abnormality.   Prostate is enlarged measuring 5.8 x 5.1 cm.    IMPRESSION:   8.6 MM RIGHT MID URETERAL CALCULUS CAUSING MODERATE RIGHT HYDRONEPHROSIS. BILATERAL NEPHROLITHIASIS.     I have personally reviewed the images and agree with radiologist interpretation. Disk was brought to clinic.  This was compare to KUB today, minimal progression of  mid ureteral stone   Assessment & Plan:    1. History of nephrolithiasis/right mid ureteral calculus   CT A/P without contrast showed a 8.6 mm right mid ureteral calculus causing moderate right hydronephrosis. Bilateral nephrolithiasis.  KUB today shows a 9 mm right mid ureteral stone.   Based on size and location of the stone, patient has less than ~5% of spontaneous interval stone passage over 30-day period.  Given that the stone seems to be moving in location, patient Singleton have a higher chance than the aforementioned.  Medical expulsive therapy was discussed as an option.  We discussed various treatment options including ESWL vs. ureteroscopy, laser lithotripsy, and stent. We discussed the risks and benefits of both including bleeding, infection, damage to surrounding structures, efficacy with need for possible further intervention, and need for temporary ureteral stent.  Patient understood and elected shock wave.   He is currently on 81 mg of ASA.  Stone is just on the edge of 2 cm outside of renal shadow.  He understands if stone within shadow, will not be able to peruse ESWL tomorrow.  He is willing to try.     [facility] [address], [place], [place] [address] [phone]  I, Selena Batten, am acting as a scribe for Dr. Hollice Singleton.  I have reviewed the above documentation for accuracy and completeness, and I agree with the above.   Timothy Espy, Timothy Singleton

## 2020-06-19 NOTE — Patient Instructions (Signed)
Lithotripsy  Lithotripsy is a treatment that can sometimes help eliminate kidney stones and the pain that they cause. A form of lithotripsy, also known as extracorporeal shock wave lithotripsy, is a nonsurgical procedure that crushes a kidney stone with shock waves. These shock waves pass through your body and focus on the kidney stone. They cause the kidney stone to break up while it is still in the urinary tract. This makes it easier for the smaller pieces of stone to pass in the urine. Tell a health care provider about:  Any allergies you have.  All medicines you are taking, including vitamins, herbs, eye drops, creams, and over-the-counter medicines.  Any blood disorders you have.  Any surgeries you have had.  Any medical conditions you have.  Whether you are pregnant or may be pregnant.  Any problems you or family members have had with anesthetic medicines. What are the risks? Generally, this is a safe procedure. However, problems may occur, including:  Infection.  Bleeding of the kidney.  Bruising of the kidney or skin.  Scarring of the kidney, which can lead to: ? Increased blood pressure. ? Poor kidney function. ? Return (recurrence) of kidney stones.  Damage to other structures or organs, such as the liver, colon, spleen, or pancreas.  Blockage (obstruction) of the the tube that carries urine from the kidney to the bladder (ureter).  Failure of the kidney stone to break into pieces (fragments). What happens before the procedure? Staying hydrated Follow instructions from your health care provider about hydration, which may include:  Up to 2 hours before the procedure - you may continue to drink clear liquids, such as water, clear fruit juice, black coffee, and plain tea. Eating and drinking restrictions Follow instructions from your health care provider about eating and drinking, which may include:  8 hours before the procedure - stop eating heavy meals or foods  such as meat, fried foods, or fatty foods.  6 hours before the procedure - stop eating light meals or foods, such as toast or cereal.  6 hours before the procedure - stop drinking milk or drinks that contain milk.  2 hours before the procedure - stop drinking clear liquids. General instructions  Plan to have someone take you home from the hospital or clinic.  Ask your health care provider about: ? Changing or stopping your regular medicines. This is especially important if you are taking diabetes medicines or blood thinners. ? Taking medicines such as aspirin and ibuprofen. These medicines and other NSAIDs can thin your blood. Do not take these medicines for 7 days before your procedure if your health care provider instructs you not to.  You may have tests, such as: ? Blood tests. ? Urine tests. ? Imaging tests, such as a CT scan. What happens during the procedure?  To lower your risk of infection: ? Your health care team will wash or sanitize their hands. ? Your skin will be washed with soap.  An IV tube will be inserted into one of your veins. This tube will give you fluids and medicines.  You will be given one or more of the following: ? A medicine to help you relax (sedative). ? A medicine to make you fall asleep (general anesthetic).  A water-filled cushion may be placed behind your kidney or on your abdomen. In some cases you may be placed in a tub of lukewarm water.  Your body will be positioned in a way that makes it easy to target the kidney   stone.  A flexible tube with holes in it (stent) may be placed in the ureter. This will help keep urine flowing from the kidney if the fragments of the stone have been blocking the ureter.  An X-ray or ultrasound exam will be done to locate your stone.  Shock waves will be aimed at the stone. If you are awake, you may feel a tapping sensation as the shock waves pass through your body. The procedure may vary among health care  providers and hospitals. What happens after the procedure?  You may have an X-ray to see whether the procedure was able to break up the kidney stone and how much of the stone has passed. If large stone fragments remain after treatment, you may need to have a second procedure at a later time.  Your blood pressure, heart rate, breathing rate, and blood oxygen level will be monitored until the medicines you were given have worn off.  You may be given antibiotics or pain medicine as needed.  If a stent was placed in your ureter during surgery, it may stay in place for a few weeks.  You may need strain your urine to collect pieces of the kidney stone for testing.  You will need to drink plenty of water.  Do not drive for 24 hours if you were given a sedative. Summary  Lithotripsy is a treatment that can sometimes help eliminate kidney stones and the pain that they cause.  A form of lithotripsy, also known as extracorporeal shock wave lithotripsy, is a nonsurgical procedure that crushes a kidney stone with shock waves.  Generally, this is a safe procedure. However, problems may occur, including damage to the kidney or other organs, infection, or obstruction of the tube that carries urine from the kidney to the bladder (ureter).  When you go home, you will need to drink plenty of water. You may be asked to strain your urine to collect pieces of the kidney stone for testing. This information is not intended to replace advice given to you by your health care provider. Make sure you discuss any questions you have with your health care provider. Document Revised: 01/16/2019 Document Reviewed: 08/26/2016 Elsevier Patient Education  2020 Elsevier Inc.  

## 2020-06-20 ENCOUNTER — Encounter
Admission: RE | Disposition: A | Discharge status: Home / Self Care | Payer: Self-pay | Source: Home / Self Care | Attending: Urology

## 2020-06-20 ENCOUNTER — Encounter: Payer: Self-pay | Admitting: Urology

## 2020-06-20 ENCOUNTER — Ambulatory Visit
Admission: RE | Admit: 2020-06-20 | Discharge: 2020-06-20 | Disposition: A | Discharge status: Home / Self Care | Payer: PPO | Attending: Urology | Admitting: Urology

## 2020-06-20 ENCOUNTER — Other Ambulatory Visit: Payer: Self-pay

## 2020-06-20 DIAGNOSIS — Z7982 Long term (current) use of aspirin: Secondary | ICD-10-CM | POA: Insufficient documentation

## 2020-06-20 DIAGNOSIS — F329 Major depressive disorder, single episode, unspecified: Secondary | ICD-10-CM | POA: Diagnosis not present

## 2020-06-20 DIAGNOSIS — I10 Essential (primary) hypertension: Secondary | ICD-10-CM | POA: Insufficient documentation

## 2020-06-20 DIAGNOSIS — Z955 Presence of coronary angioplasty implant and graft: Secondary | ICD-10-CM | POA: Insufficient documentation

## 2020-06-20 DIAGNOSIS — G4730 Sleep apnea, unspecified: Secondary | ICD-10-CM | POA: Diagnosis not present

## 2020-06-20 DIAGNOSIS — Z87891 Personal history of nicotine dependence: Secondary | ICD-10-CM | POA: Diagnosis not present

## 2020-06-20 DIAGNOSIS — K219 Gastro-esophageal reflux disease without esophagitis: Secondary | ICD-10-CM | POA: Insufficient documentation

## 2020-06-20 DIAGNOSIS — Z79899 Other long term (current) drug therapy: Secondary | ICD-10-CM | POA: Diagnosis not present

## 2020-06-20 DIAGNOSIS — E785 Hyperlipidemia, unspecified: Secondary | ICD-10-CM | POA: Diagnosis not present

## 2020-06-20 DIAGNOSIS — N132 Hydronephrosis with renal and ureteral calculous obstruction: Secondary | ICD-10-CM | POA: Diagnosis not present

## 2020-06-20 DIAGNOSIS — I252 Old myocardial infarction: Secondary | ICD-10-CM | POA: Diagnosis not present

## 2020-06-20 DIAGNOSIS — Z85820 Personal history of malignant melanoma of skin: Secondary | ICD-10-CM | POA: Insufficient documentation

## 2020-06-20 DIAGNOSIS — N200 Calculus of kidney: Secondary | ICD-10-CM

## 2020-06-20 DIAGNOSIS — N201 Calculus of ureter: Secondary | ICD-10-CM | POA: Diagnosis not present

## 2020-06-20 DIAGNOSIS — Z96619 Presence of unspecified artificial shoulder joint: Secondary | ICD-10-CM | POA: Insufficient documentation

## 2020-06-20 DIAGNOSIS — Z87442 Personal history of urinary calculi: Secondary | ICD-10-CM | POA: Diagnosis not present

## 2020-06-20 HISTORY — PX: EXTRACORPOREAL SHOCK WAVE LITHOTRIPSY: SHX1557

## 2020-06-20 LAB — MICROSCOPIC EXAMINATION

## 2020-06-20 LAB — URINALYSIS, COMPLETE
Bilirubin, UA: NEGATIVE
Glucose, UA: NEGATIVE
Ketones, UA: NEGATIVE
Nitrite, UA: NEGATIVE
Protein,UA: NEGATIVE
Specific Gravity, UA: 1.02 (ref 1.005–1.030)
Urobilinogen, Ur: 0.2 mg/dL (ref 0.2–1.0)
pH, UA: 5.5 (ref 5.0–7.5)

## 2020-06-20 SURGERY — LITHOTRIPSY, ESWL
Anesthesia: Moderate Sedation | Laterality: Right

## 2020-06-20 MED ORDER — CIPROFLOXACIN HCL 500 MG PO TABS
ORAL_TABLET | ORAL | Status: AC
  Filled 2020-06-20: qty 1

## 2020-06-20 MED ORDER — SODIUM CHLORIDE 0.9 % IV SOLN: INTRAVENOUS | Status: DC

## 2020-06-20 MED ORDER — DIPHENHYDRAMINE HCL 25 MG PO CAPS
ORAL_CAPSULE | ORAL | Status: AC
  Filled 2020-06-20: qty 1

## 2020-06-20 MED ORDER — DIAZEPAM 5 MG PO TABS
ORAL_TABLET | ORAL | Status: AC
  Filled 2020-06-20: qty 2

## 2020-06-20 MED ORDER — HYDROCODONE-ACETAMINOPHEN 5-325 MG PO TABS: 1.0000 | ORAL_TABLET | Freq: Four times a day (QID) | ORAL | 0 refills | Status: DC | PRN

## 2020-06-20 MED ORDER — DIPHENHYDRAMINE HCL 25 MG PO CAPS
25.0000 mg | ORAL_CAPSULE | ORAL | Status: AC
  Administered 2020-06-20: 25 mg via ORAL

## 2020-06-20 MED ORDER — CIPROFLOXACIN HCL 500 MG PO TABS
500.0000 mg | ORAL_TABLET | ORAL | Status: AC
  Administered 2020-06-20: 500 mg via ORAL

## 2020-06-20 MED ORDER — DIAZEPAM 5 MG PO TABS
10.0000 mg | ORAL_TABLET | ORAL | Status: AC
  Administered 2020-06-20: 10 mg via ORAL

## 2020-06-20 MED ORDER — ONDANSETRON HCL 4 MG/2ML IJ SOLN: 4.0000 mg | Freq: Once | INTRAMUSCULAR | Status: DC | PRN

## 2020-06-20 NOTE — Interval H&P Note (Signed)
History and Physical Interval Note:  06/20/2020 1:41 PM  Timothy Singleton  has presented today for surgery, with the diagnosis of Kidney stone.  The various methods of treatment have been discussed with the patient and family. After consideration of risks, benefits and other options for treatment, the patient has consented to  Procedure(s): EXTRACORPOREAL SHOCK WAVE LITHOTRIPSY (ESWL) (Right) as a surgical intervention.  The patient's history has been reviewed, patient examined, no change in status, stable for surgery.  I have reviewed the patient's chart and labs.  Questions were answered to the patient's satisfaction.     Hollice Espy

## 2020-06-20 NOTE — Discharge Instructions (Signed)
See [facility] discharge instructions in chart.  

## 2020-06-21 ENCOUNTER — Other Ambulatory Visit: Payer: Self-pay | Admitting: Radiology

## 2020-06-21 DIAGNOSIS — N200 Calculus of kidney: Secondary | ICD-10-CM

## 2020-06-26 ENCOUNTER — Other Ambulatory Visit: Payer: Self-pay

## 2020-06-26 ENCOUNTER — Other Ambulatory Visit: Payer: PPO

## 2020-06-26 DIAGNOSIS — Z08 Encounter for follow-up examination after completed treatment for malignant neoplasm: Secondary | ICD-10-CM | POA: Diagnosis not present

## 2020-06-26 DIAGNOSIS — L570 Actinic keratosis: Secondary | ICD-10-CM | POA: Diagnosis not present

## 2020-06-26 DIAGNOSIS — C44622 Squamous cell carcinoma of skin of right upper limb, including shoulder: Secondary | ICD-10-CM | POA: Diagnosis not present

## 2020-06-26 DIAGNOSIS — Z85828 Personal history of other malignant neoplasm of skin: Secondary | ICD-10-CM | POA: Diagnosis not present

## 2020-06-26 DIAGNOSIS — E291 Testicular hypofunction: Secondary | ICD-10-CM | POA: Diagnosis not present

## 2020-06-26 DIAGNOSIS — Z85820 Personal history of malignant melanoma of skin: Secondary | ICD-10-CM | POA: Diagnosis not present

## 2020-06-26 DIAGNOSIS — X32XXXA Exposure to sunlight, initial encounter: Secondary | ICD-10-CM | POA: Diagnosis not present

## 2020-06-26 DIAGNOSIS — D485 Neoplasm of uncertain behavior of skin: Secondary | ICD-10-CM | POA: Diagnosis not present

## 2020-06-26 DIAGNOSIS — L728 Other follicular cysts of the skin and subcutaneous tissue: Secondary | ICD-10-CM | POA: Diagnosis not present

## 2020-06-27 LAB — TESTOSTERONE: Testosterone: 208 ng/dL — ABNORMAL LOW (ref 264–916)

## 2020-06-28 ENCOUNTER — Other Ambulatory Visit: Payer: Self-pay | Admitting: Family Medicine

## 2020-06-28 DIAGNOSIS — F32A Depression, unspecified: Secondary | ICD-10-CM

## 2020-06-28 MED ORDER — SERTRALINE HCL 100 MG PO TABS: 100.0000 mg | ORAL_TABLET | Freq: Every day | ORAL | 1 refills | Status: DC

## 2020-06-28 NOTE — Telephone Encounter (Signed)
° °  Notes to clinic:  Patient was told by the doctor to take 1.5 pills and that is why he is coming up short   Requested Prescriptions  Pending Prescriptions Disp Refills   sertraline (ZOLOFT) 100 MG tablet 90 tablet 1    Sig: Take 1 tablet (100 mg total) by mouth daily.      Psychiatry:  Antidepressants - SSRI Passed - 06/28/2020  9:52 AM      Passed - Completed PHQ-2 or PHQ-9 in the last 360 days.      Passed - Valid encounter within last 6 months    Recent Outpatient Visits           5 months ago Arteriosclerosis of coronary artery   [facility] Jerrol Banana., MD   8 months ago Annual physical exam   [facility] Jerrol Banana., MD   1 year ago Depression, unspecified depression type   [facility] Jerrol Banana., MD   1 year ago Annual physical exam   [facility] Jerrol Banana., MD   2 years ago CAD in native artery   [facility] Jerrol Banana., MD       Future Appointments             In 1 week Jerrol Banana., MD [facility], [place]   In 1 week [address], [facility]

## 2020-06-28 NOTE — Telephone Encounter (Signed)
Medication: sertraline (ZOLOFT) 100 MG tablet [[id number]] Patient was told by the doctor to take 1.5 pills and that is why he is coming up short  Has the patient contacted their pharmacy? YES (Agent: If no, request that the patient contact the pharmacy for the refill.) (Agent: If yes, when and what did the pharmacy advise?)  Preferred Pharmacy (with phone number or street name): [id number] - [place], [place] - [address] [place] [address] Phone: [phone] Fax: [phone] Hours: Not open 24 hours    Agent: Please be advised that RX refills may take up to 3 business days. We ask that you follow-up with your pharmacy.

## 2020-06-28 NOTE — Progress Notes (Signed)
Trena Platt Victora Irby,acting as a scribe for Wilhemena Durie, MD.,have documented all relevant documentation on the behalf of Wilhemena Durie, MD,as directed by  Wilhemena Durie, MD while in the presence of Wilhemena Durie, MD.  Established patient visit   Patient: Timothy Singleton   DOB: 1946-07-03   74 y.o. Male  MRN: [id number] Visit Date: 07/08/2020  Today's healthcare provider: Wilhemena Durie, MD   Chief Complaint  Patient presents with  . Hypertension   Subjective    HPI  Patient had a kidney stone 2 weeks ago followed by lithotripsy but otherwise is feeling well.  He has no complaints today. Hypertension, follow-up  BP Readings from Last 3 Encounters:  07/08/20 131/69  06/20/20 138/62  06/19/20 134/66   Wt Readings from Last 3 Encounters:  07/08/20 177 lb 6.4 oz (80.5 kg)  06/19/20 173 lb (78.5 kg)  06/07/20 173 lb (78.5 kg)     He was last seen for hypertension 6 months ago.  BP at that visit was 130/68. Management since that visit includes; on lisinopril. He reports excellent compliance with treatment. He is not having side effects.  He is exercising. He is not adherent to low salt diet.   Outside blood pressures are not being checked.  He does not smoke.  Use of agents associated with hypertension: none.   ---------------------------------------------------------------------------------------------------  Social History   Tobacco Use  . Smoking status: Former Research scientist (life sciences)  . Smokeless tobacco: Former Systems developer  . Tobacco comment: quit 40 years ago   Substance Use Topics  . Alcohol use: Yes    Alcohol/week: 0.0 standard drinks    Comment: 2-4 times per month  . Drug use: No       Medications: Outpatient Medications Prior to Visit  Medication Sig  . aspirin 81 MG tablet Take 81 mg by mouth daily.  Marland Kitchen atorvastatin (LIPITOR) 80 MG tablet atorvastatin 80 mg tablet  TAKE 1 TABLET BY MOUTH EVERY DAY  . carbamazepine (CARBATROL) 200 MG 12 hr  capsule TAKE 1 CAPSULE BY MOUTH TWICE A DAY  . lisinopril (ZESTRIL) 20 MG tablet Take 1 tablet by mouth daily.  . pantoprazole (PROTONIX) 40 MG tablet TAKE 1 TABLET BY MOUTH EVERY DAY  . sertraline (ZOLOFT) 100 MG tablet Take 1 tablet (100 mg total) by mouth daily.  . tamsulosin (FLOMAX) 0.4 MG CAPS capsule TAKE 1 CAPSULE BY MOUTH EVERY DAY  . HYDROcodone-acetaminophen (NORCO/VICODIN) 5-325 MG tablet Take 1-2 tablets by mouth every 6 (six) hours as needed for moderate pain. (Patient not taking: Reported on 07/08/2020)   No facility-administered medications prior to visit.    Review of Systems  Constitutional: Negative for appetite change, chills and fever.  Respiratory: Negative for chest tightness, shortness of breath and wheezing.   Cardiovascular: Negative for chest pain and palpitations.  Gastrointestinal: Negative for abdominal pain, nausea and vomiting.       Objective    BP 131/69 (BP Location: Right Arm, Patient Position: Sitting, Cuff Size: Large)   Pulse (!) 52   Temp 98.3 F (36.8 C) (Oral)   Ht 5\' 11"  (1.803 m)   Wt 177 lb 6.4 oz (80.5 kg)   BMI 24.74 kg/m  BP Readings from Last 3 Encounters:  07/08/20 123/62  07/08/20 131/69  06/20/20 138/62   Wt Readings from Last 3 Encounters:  07/08/20 175 lb (79.4 kg)  07/08/20 177 lb 6.4 oz (80.5 kg)  06/19/20 173 lb (78.5 kg)  Physical Exam Vitals reviewed.  Constitutional:      Appearance: Normal appearance.  HENT:     Head: Normocephalic and atraumatic.     Right Ear: External ear normal.     Left Ear: External ear normal.  Eyes:     General: No scleral icterus.    Conjunctiva/sclera: Conjunctivae normal.  Cardiovascular:     Rate and Rhythm: Normal rate and regular rhythm.     Pulses: Normal pulses.     Heart sounds: Normal heart sounds.  Pulmonary:     Effort: Pulmonary effort is normal.     Breath sounds: Normal breath sounds.  Musculoskeletal:     Right lower leg: No edema.     Left lower leg: No  edema.  Skin:    General: Skin is warm and dry.  Neurological:     General: No focal deficit present.     Mental Status: He is alert and oriented to person, place, and time.  Psychiatric:        Mood and Affect: Mood normal.        Behavior: Behavior normal.        Thought Content: Thought content normal.        Judgment: Judgment normal.       No results found for any visits on 07/08/20.  Assessment & Plan     1. Need for immunization against influenza  - Flu Vaccine QUAD High Dose(Fluad)  2. Essential (primary) hypertension Routine physical in 6 months. On lisinopril - CBC with Differential/Platelet - TSH - Comprehensive metabolic panel - Lipid panel  3. Obstructive sleep apnea On CPAP - CBC with Differential/Platelet - TSH - Comprehensive metabolic panel - Lipid panel  4. Other hyperlipidemia On Lipitor - CBC with Differential/Platelet - TSH - Comprehensive metabolic panel - Lipid panel  5. CAD in native artery All risk factors treated - CBC with Differential/Platelet - TSH - Comprehensive metabolic panel - Lipid panel  6. Renal colic Has follow-up with urology today. 7.Acid reflux Discussed trying to cut back on pantoprazole.  He will try to take it as needed.  He knows which foods bother him. 8.  Depression in remission No follow-ups on file.      I, Wilhemena Durie, MD, have reviewed all documentation for this visit. The documentation on 07/12/20 for the exam, diagnosis, procedures, and orders are all accurate and complete.    Richard Cranford Mon, MD  [facility] [phone] (phone) [phone] (fax)  [place]

## 2020-07-03 ENCOUNTER — Ambulatory Visit: Payer: Self-pay | Admitting: Urology

## 2020-07-03 DIAGNOSIS — G4733 Obstructive sleep apnea (adult) (pediatric): Secondary | ICD-10-CM | POA: Diagnosis not present

## 2020-07-08 ENCOUNTER — Ambulatory Visit
Admission: RE | Admit: 2020-07-08 | Discharge: 2020-07-08 | Disposition: A | Discharge status: Home / Self Care | Payer: PPO | Source: Ambulatory Visit | Attending: Urology | Admitting: Urology

## 2020-07-08 ENCOUNTER — Ambulatory Visit
Admission: RE | Admit: 2020-07-08 | Discharge: 2020-07-08 | Disposition: A | Discharge status: Home / Self Care | Payer: PPO | Attending: Urology | Admitting: Urology

## 2020-07-08 ENCOUNTER — Ambulatory Visit: Payer: Self-pay | Admitting: Physician Assistant

## 2020-07-08 ENCOUNTER — Other Ambulatory Visit: Payer: Self-pay

## 2020-07-08 ENCOUNTER — Encounter: Payer: Self-pay | Admitting: Urology

## 2020-07-08 ENCOUNTER — Ambulatory Visit (INDEPENDENT_AMBULATORY_CARE_PROVIDER_SITE_OTHER): Payer: PPO | Admitting: Urology

## 2020-07-08 ENCOUNTER — Ambulatory Visit (INDEPENDENT_AMBULATORY_CARE_PROVIDER_SITE_OTHER): Payer: PPO | Admitting: Family Medicine

## 2020-07-08 ENCOUNTER — Encounter: Payer: Self-pay | Admitting: Family Medicine

## 2020-07-08 VITALS — BP 131/69 | HR 52 | Temp 98.3°F | Ht 71.0 in | Wt 177.4 lb

## 2020-07-08 VITALS — BP 123/62 | HR 63 | Ht 71.0 in | Wt 175.0 lb

## 2020-07-08 DIAGNOSIS — F325 Major depressive disorder, single episode, in full remission: Secondary | ICD-10-CM | POA: Diagnosis not present

## 2020-07-08 DIAGNOSIS — N200 Calculus of kidney: Secondary | ICD-10-CM | POA: Insufficient documentation

## 2020-07-08 DIAGNOSIS — I10 Essential (primary) hypertension: Secondary | ICD-10-CM | POA: Diagnosis not present

## 2020-07-08 DIAGNOSIS — E349 Endocrine disorder, unspecified: Secondary | ICD-10-CM

## 2020-07-08 DIAGNOSIS — E7849 Other hyperlipidemia: Secondary | ICD-10-CM

## 2020-07-08 DIAGNOSIS — N23 Unspecified renal colic: Secondary | ICD-10-CM

## 2020-07-08 DIAGNOSIS — K219 Gastro-esophageal reflux disease without esophagitis: Secondary | ICD-10-CM | POA: Diagnosis not present

## 2020-07-08 DIAGNOSIS — G4733 Obstructive sleep apnea (adult) (pediatric): Secondary | ICD-10-CM | POA: Diagnosis not present

## 2020-07-08 DIAGNOSIS — Z23 Encounter for immunization: Secondary | ICD-10-CM | POA: Diagnosis not present

## 2020-07-08 DIAGNOSIS — I2510 Atherosclerotic heart disease of native coronary artery without angina pectoris: Secondary | ICD-10-CM | POA: Diagnosis not present

## 2020-07-08 DIAGNOSIS — I878 Other specified disorders of veins: Secondary | ICD-10-CM | POA: Diagnosis not present

## 2020-07-08 NOTE — Progress Notes (Signed)
07/08/2020 9:09 AM   Timothy Singleton 04-10-1946 [id number]  Referring provider: Jerrol Banana., MD [address] [place],  [place] [address]  Chief Complaint  Patient presents with  . Follow-up    kidney stones    HPI: Timothy Singleton is a 74 y.o. who is status post ESWL who presents today for follow up.  Underwent ESWL on 06/20/2020 for a mid right 8 mm ureteral stone with Dr. Erlene Quan.  Their postprocedural course was as expected and uneventful.   They have not passed fragments.    KUB today the mid ureteral stone is no longer visible in the mid ureter.    He was also under the care [facility] for his [facility] deficiency, but it was not covered by his insurance and is wanting to transfer his care to [place] for TRT.  PMH: Past Medical History:  Diagnosis Date  . Arthritis   . Cancer ([place])    melanoma / knee  . Depression   . GERD (gastroesophageal reflux disease)   . Hyperlipemia   . Hypertension   . Past heart attack   . Sleep apnea     Surgical History: Past Surgical History:  Procedure Laterality Date  . BUNIONECTOMY    . COLONOSCOPY WITH PROPOFOL N/A 11/17/2016   Procedure: COLONOSCOPY WITH PROPOFOL;  Surgeon: Jonathon Bellows, MD;  Location: [facility] ENDOSCOPY;  Service: Endoscopy;  Laterality: N/A;  . CORONARY STENT PLACEMENT    . EXTRACORPOREAL SHOCK WAVE LITHOTRIPSY    . EXTRACORPOREAL SHOCK WAVE LITHOTRIPSY Right 12/30/2017   Procedure: EXTRACORPOREAL SHOCK WAVE LITHOTRIPSY (ESWL);  Surgeon: Hollice Espy, MD;  Location: [facility] ORS;  Service: Urology;  Laterality: Right;  . EXTRACORPOREAL SHOCK WAVE LITHOTRIPSY Right 06/20/2020   Procedure: EXTRACORPOREAL SHOCK WAVE LITHOTRIPSY (ESWL);  Surgeon: Hollice Espy, MD;  Location: [facility] ORS;  Service: Urology;  Laterality: Right;  . HERNIA REPAIR     inguinal-right  . JOINT REPLACEMENT     total shoulder replacement  . KNEE SURGERY Right   . TONSILLECTOMY    . TOTAL SHOULDER  REPLACEMENT    . UPPER GI ENDOSCOPY  10/18/01   hiatus hernia  . VASECTOMY    . WRIST SURGERY      Home Medications:  Current Outpatient Medications on File Prior to Visit  Medication Sig Dispense Refill  . aspirin 81 MG tablet Take 81 mg by mouth daily.    Marland Kitchen atorvastatin (LIPITOR) 80 MG tablet atorvastatin 80 mg tablet  TAKE 1 TABLET BY MOUTH EVERY DAY    . carbamazepine (CARBATROL) 200 MG 12 hr capsule TAKE 1 CAPSULE BY MOUTH TWICE A DAY 60 capsule 5  . lisinopril (ZESTRIL) 20 MG tablet Take 1 tablet by mouth daily.    . pantoprazole (PROTONIX) 40 MG tablet TAKE 1 TABLET BY MOUTH EVERY DAY 90 tablet 2  . sertraline (ZOLOFT) 100 MG tablet Take 1 tablet (100 mg total) by mouth daily. 90 tablet 1  . tamsulosin (FLOMAX) 0.4 MG CAPS capsule TAKE 1 CAPSULE BY MOUTH EVERY DAY 90 capsule 4   No current facility-administered medications on file prior to visit.    Allergies: No Known Allergies  Family History: Family History  Problem Relation Age of Onset  . Cancer Mother   . Dementia Mother   . Stroke Father   . Heart disease Father   . Hypertension Father   . Breast cancer Sister   . Parkinson's disease Brother   . Hypertension Brother   .  Melanoma Maternal Grandmother   . Bladder Cancer Neg Hx   . Prostate cancer Neg Hx   . Kidney cancer Neg Hx     Social History:  reports that he has quit smoking. He has quit using smokeless tobacco. He reports current alcohol use. He reports that he does not use drugs.  ROS: Pertinent ROS in HPI  Physical Exam: BP 123/62   Pulse 63   Ht 5\' 11"  (1.803 m)   Wt 175 lb (79.4 kg)   BMI 24.41 kg/m   Constitutional:  Well nourished. Alert and oriented, No acute distress. HEENT: [place] AT, mask in place.   Trachea midline. Cardiovascular: No clubbing, cyanosis, or edema. Respiratory: Normal respiratory effort, no increased work of breathing. Neurologic: Grossly intact, no focal deficits, moving all 4 extremities. Psychiatric: Normal mood  and affect.  Laboratory Data: Lab Results  Component Value Date   WBC 4.9 07/08/2020   HGB 13.2 07/08/2020   HCT 39.9 07/08/2020   MCV 95 07/08/2020   PLT 220 07/08/2020    Lab Results  Component Value Date   CREATININE 1.06 07/08/2020       Component Value Date/Time   CHOL 116 07/08/2020 0906   HDL 49 07/08/2020 0906   CHOLHDL 2.4 07/08/2020 0906   CHOLHDL 4.8 10/04/2017 0935   LDLCALC 52 07/08/2020 0906   LDLCALC 101 (H) 10/04/2017 0935    Urinalysis    Component Value Date/Time   COLORURINE YELLOW (A) 03/11/2016 1249   APPEARANCEUR Hazy (A) 06/19/2020 0957   LABSPEC 1.012 03/11/2016 1249   PHURINE 6.0 03/11/2016 1249   GLUCOSEU Negative 06/19/2020 0957   HGBUR 1+ (A) 03/11/2016 1249   BILIRUBINUR Negative 06/19/2020 0957   KETONESUR NEGATIVE 03/11/2016 1249   PROTEINUR Negative 06/19/2020 0957   PROTEINUR NEGATIVE 03/11/2016 1249   NITRITE Negative 06/19/2020 0957   NITRITE NEGATIVE 03/11/2016 1249   LEUKOCYTESUR 1+ (A) 06/19/2020 0957    I have reviewed the labs.   Pertinent Imaging: CLINICAL DATA:  Follow-up right-sided nephrolithiasis. History of lithotripsy 2 weeks ago.  EXAM: ABDOMEN - 1 VIEW  COMPARISON:  06/19/2020; 04/24/2020  FINDINGS: Multiple ill-defined punctate opacities overlie the expected location the bilateral renal fossa potentially representative of renal stones however evaluation is degraded secondary to overlying stool burden.  Dominant suspected bilateral renal stones both measure approximately 0.6 cm in diameter.  Multiple phleboliths overlie the lower pelvis bilaterally.  No definitive abnormal opacities overlies expected location of either ureter or the urinary bladder.  Multiple surgical clips overlie the right hemipelvis.  Nonobstructive bowel gas pattern.  No supine evidence of pneumoperitoneum. No pneumatosis or portal venous gas. Limited visualization of lower thorax is normal.  No definite acute  or aggressive osseous abnormalities. Mild-to-moderate multilevel lumbar spine DDD, worse at L4-L5, incompletely evaluated.  IMPRESSION: Suspected bilateral nephrolithiasis, suboptimally evaluated due to overlying stool burden. Further evaluation with noncontrast CT scan of the abdomen and pelvis could be performed as indicated.   Electronically Signed   By: Sandi Mariscal M.D.   On: 07/08/2020 09:49  I have independently reviewed the films.  See HPI.    Assessment & Plan:    1. Right mid ureteral calculus -s/p successful Right ESWL -RTC for RUS report   2. Testosterone deficiency -Explained to the patient that he will need to have 2 morning testosterone levels at least 2 days apart for confirmation prior to starting any testosterone therapy -He states he is mostly interested in restarting the Testopel pellets, so if  he meets criteria we will inquire with his insurance about coverage and schedule if appropriate  Return for return for testosterone level in the am x 2 .  These notes generated with voice recognition software. I apologize for typographical errors.  Zara Council, PA-C  [place] [address]  [place], [place] [address]  I spent 10 minutes on the day of the encounter to include pre-visit record review, face-to-face time with the patient, and post-visit ordering of tests. [phone]

## 2020-07-09 ENCOUNTER — Other Ambulatory Visit
Admission: RE | Admit: 2020-07-09 | Discharge: 2020-07-09 | Disposition: A | Discharge status: Home / Self Care | Payer: PPO | Attending: Urology | Admitting: Urology

## 2020-07-09 DIAGNOSIS — E291 Testicular hypofunction: Secondary | ICD-10-CM | POA: Diagnosis not present

## 2020-07-09 LAB — CBC WITH DIFFERENTIAL/PLATELET
Basophils Absolute: 0 10*3/uL (ref 0.0–0.2)
Basos: 1 %
EOS (ABSOLUTE): 0.4 10*3/uL (ref 0.0–0.4)
Eos: 8 %
Hematocrit: 39.9 % (ref 37.5–51.0)
Hemoglobin: 13.2 g/dL (ref 13.0–17.7)
Immature Grans (Abs): 0 10*3/uL (ref 0.0–0.1)
Immature Granulocytes: 0 %
Lymphocytes Absolute: 1.3 10*3/uL (ref 0.7–3.1)
Lymphs: 25 %
MCH: 31.4 pg (ref 26.6–33.0)
MCHC: 33.1 g/dL (ref 31.5–35.7)
MCV: 95 fL (ref 79–97)
Monocytes Absolute: 0.5 10*3/uL (ref 0.1–0.9)
Monocytes: 11 %
Neutrophils Absolute: 2.7 10*3/uL (ref 1.4–7.0)
Neutrophils: 55 %
Platelets: 220 10*3/uL (ref 150–450)
RBC: 4.2 x10E6/uL (ref 4.14–5.80)
RDW: 12.8 % (ref 11.6–15.4)
WBC: 4.9 10*3/uL (ref 3.4–10.8)

## 2020-07-09 LAB — LIPID PANEL
Chol/HDL Ratio: 2.4 ratio (ref 0.0–5.0)
Cholesterol, Total: 116 mg/dL (ref 100–199)
HDL: 49 mg/dL (ref >39)
LDL Chol Calc (NIH): 52 mg/dL (ref 0–99)
Triglycerides: 70 mg/dL (ref 0–149)
VLDL Cholesterol Cal: 15 mg/dL (ref 5–40)

## 2020-07-09 LAB — COMPREHENSIVE METABOLIC PANEL
ALT: 15 IU/L (ref 0–44)
AST: 20 IU/L (ref 0–40)
Albumin/Globulin Ratio: 2.3 — ABNORMAL HIGH (ref 1.2–2.2)
Albumin: 4.1 g/dL (ref 3.7–4.7)
Alkaline Phosphatase: 86 IU/L (ref 44–121)
BUN/Creatinine Ratio: 21 (ref 10–24)
BUN: 22 mg/dL (ref 8–27)
Bilirubin Total: <0.2 mg/dL (ref 0.0–1.2)
CO2: 23 mmol/L (ref 20–29)
Calcium: 9.7 mg/dL (ref 8.6–10.2)
Chloride: 105 mmol/L (ref 96–106)
Creatinine, Ser: 1.06 mg/dL (ref 0.76–1.27)
GFR calc Af Amer: 79 mL/min/{1.73_m2} (ref >59)
GFR calc non Af Amer: 68 mL/min/{1.73_m2} (ref >59)
Globulin, Total: 1.8 g/dL (ref 1.5–4.5)
Glucose: 102 mg/dL — ABNORMAL HIGH (ref 65–99)
Potassium: 4.7 mmol/L (ref 3.5–5.2)
Sodium: 142 mmol/L (ref 134–144)
Total Protein: 5.9 g/dL — ABNORMAL LOW (ref 6.0–8.5)

## 2020-07-09 LAB — TSH: TSH: 1.78 u[IU]/mL (ref 0.450–4.500)

## 2020-07-10 ENCOUNTER — Telehealth: Payer: Self-pay

## 2020-07-10 LAB — TESTOSTERONE: Testosterone: 210 ng/dL — ABNORMAL LOW (ref 264–916)

## 2020-07-10 NOTE — Telephone Encounter (Signed)
Patient advised of lab results via mychart and has read the providers comments.

## 2020-07-10 NOTE — Telephone Encounter (Signed)
-----   Message from Jerrol Banana., MD sent at 07/09/2020  8:25 AM EDT ----- Labs look good.

## 2020-07-11 ENCOUNTER — Other Ambulatory Visit: Payer: Self-pay

## 2020-07-11 ENCOUNTER — Other Ambulatory Visit
Admission: RE | Admit: 2020-07-11 | Discharge: 2020-07-11 | Disposition: A | Discharge status: Home / Self Care | Payer: PPO | Attending: Urology | Admitting: Urology

## 2020-07-11 DIAGNOSIS — E291 Testicular hypofunction: Secondary | ICD-10-CM | POA: Diagnosis not present

## 2020-07-12 LAB — TESTOSTERONE: Testosterone: 204 ng/dL — ABNORMAL LOW (ref 264–916)

## 2020-07-15 ENCOUNTER — Telehealth: Payer: Self-pay | Admitting: Family Medicine

## 2020-07-15 NOTE — Telephone Encounter (Signed)
-----   Message from Nori Riis, PA-C sent at 07/12/2020  9:11 AM EDT ----- Please let Mr. Peters know that his testosterone's have both returned below 300 as we expected and we will go ahead and perform the prior authorization for Testopel and get him scheduled for the insertion.

## 2020-07-15 NOTE — Telephone Encounter (Signed)
LMOM informed patient of low Testosterone and PA for Testopel has been started. We will contact to schedule appointment when approved.

## 2020-07-17 NOTE — Telephone Encounter (Signed)
Has he been on testosterone gels in the past?

## 2020-07-17 NOTE — Telephone Encounter (Signed)
Before I speak with him, if he would contact his insurance company and get a list of the TRT that are listed on his formulary.  Otherwise, we may end up prescribing several different types of TRT before we can find one that his insurance will cover.

## 2020-07-17 NOTE — Telephone Encounter (Signed)
Patient states he has not taken the gels in the past. He states he would like to speak to you, he has a couple questions. I offered to schedule him for a virtual visit or a office visit to go over any questions. He states he would just prefer to speak to you. Please advise on what you would like to do.

## 2020-07-17 NOTE — Telephone Encounter (Signed)
Patient's insurance has denied coverage for the Testopel. Patient notified and is wanting to know what your other option advice will be?

## 2020-07-17 NOTE — Telephone Encounter (Signed)
Patient notified and will call his insurance company.

## 2020-07-18 ENCOUNTER — Telehealth: Payer: Self-pay | Admitting: Urology

## 2020-07-18 NOTE — Telephone Encounter (Signed)
Pt will send message via [employer] concerning formulary meds from insurance company.

## 2020-08-02 DIAGNOSIS — G4733 Obstructive sleep apnea (adult) (pediatric): Secondary | ICD-10-CM | POA: Diagnosis not present

## 2020-08-13 DIAGNOSIS — I2510 Atherosclerotic heart disease of native coronary artery without angina pectoris: Secondary | ICD-10-CM | POA: Diagnosis not present

## 2020-08-13 DIAGNOSIS — E782 Mixed hyperlipidemia: Secondary | ICD-10-CM | POA: Diagnosis not present

## 2020-08-13 DIAGNOSIS — I10 Essential (primary) hypertension: Secondary | ICD-10-CM | POA: Diagnosis not present

## 2020-08-13 DIAGNOSIS — I6523 Occlusion and stenosis of bilateral carotid arteries: Secondary | ICD-10-CM | POA: Diagnosis not present

## 2020-08-19 DIAGNOSIS — C44622 Squamous cell carcinoma of skin of right upper limb, including shoulder: Secondary | ICD-10-CM | POA: Diagnosis not present

## 2020-08-30 ENCOUNTER — Other Ambulatory Visit: Payer: Self-pay | Admitting: Family Medicine

## 2020-08-30 DIAGNOSIS — G500 Trigeminal neuralgia: Secondary | ICD-10-CM

## 2020-09-02 DIAGNOSIS — G4733 Obstructive sleep apnea (adult) (pediatric): Secondary | ICD-10-CM | POA: Diagnosis not present

## 2020-09-30 DIAGNOSIS — E291 Testicular hypofunction: Secondary | ICD-10-CM | POA: Diagnosis not present

## 2020-09-30 DIAGNOSIS — N529 Male erectile dysfunction, unspecified: Secondary | ICD-10-CM | POA: Diagnosis not present

## 2020-10-02 DIAGNOSIS — G4733 Obstructive sleep apnea (adult) (pediatric): Secondary | ICD-10-CM | POA: Diagnosis not present

## 2020-10-08 IMAGING — CT CT ANGIO NECK
2 of 9 series · 4 of 33 positions shown · IV contrast (iopamidol)
Comparison: Carotid Doppler ultrasound 07/05/2018. Neck CTA
02/01/2013.

CLINICAL DATA: Carotid stenosis. Recent TIA. Dizziness.

EXAM:
CT ANGIOGRAPHY NECK
TECHNIQUE: Multidetector CT imaging of the neck was performed using the
standard protocol during bolus administration of intravenous
contrast. Multiplanar CT image reconstructions and MIPs were
obtained to evaluate the vascular anatomy. Carotid stenosis
measurements (when applicable) are obtained utilizing NASCET
criteria, using the distal internal carotid diameter as the
denominator.
CONTRAST:  75mL QSW3IB-LPU IOPAMIDOL (QSW3IB-LPU) INJECTION 76%

[Series 7: ax thin mips cta neck (person_name) · axial · 0.60mm/px · z∈[-937,-613]mm · 3 of 325 slices shown]
[im 1/325  soft-tissue]
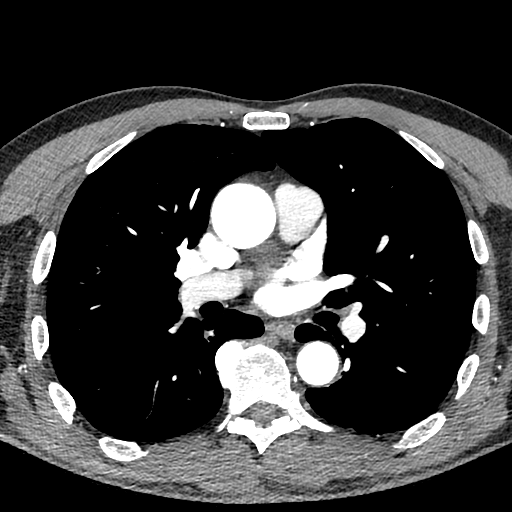
[im 163/325  bone]
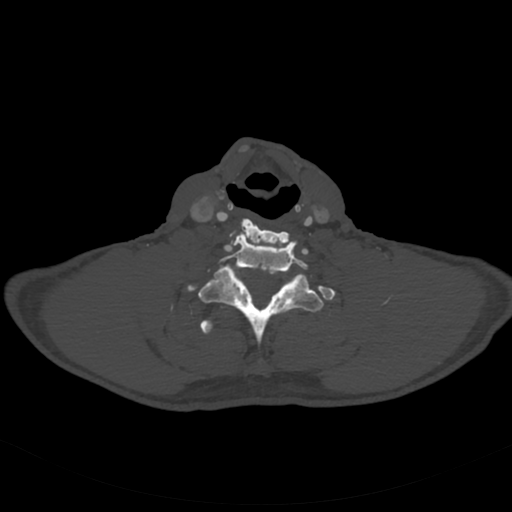
[im 325/325  soft-tissue]
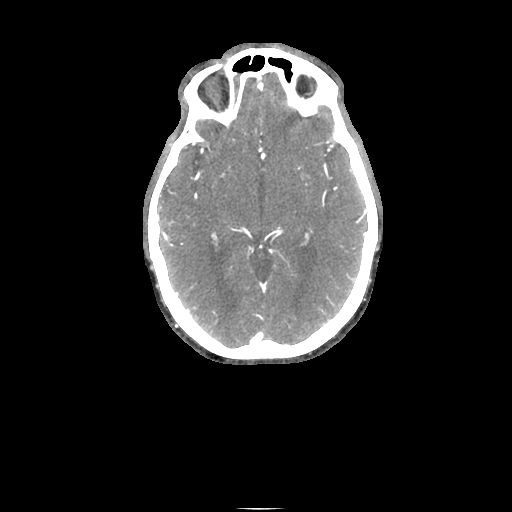

[Series 19: sag thin mips cta neck · sagittal · 0.62mm/px · 1 of 307 slices shown]
[im 154/307  soft-tissue]
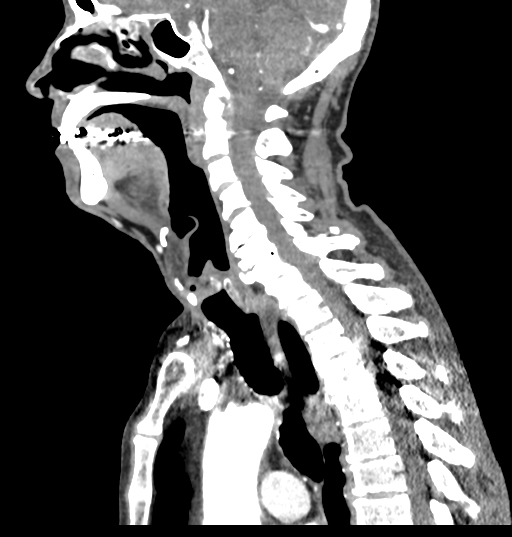

[4 of 33 positions shown; findings below may reference images not displayed]

FINDINGS: Aortic arch: Standard 3 vessel aortic arch with mild atherosclerotic
plaque. Increased plaque in both subclavian arteries compared to the
prior CTA resulting in prominent luminal irregularity as well as a
50% left subclavian artery stenosis proximal to the vertebral
origin.

Right carotid system: Predominantly soft plaque throughout the
proximal to mid common carotid artery without significant stenosis.
Calcified plaque at the carotid bifurcation and in the proximal ICA
has increased from the prior CTA but does not result in significant
stenosis.

Left carotid system: Mixed calcified and soft plaque throughout the
mid common carotid artery has increased from the prior CTA and
results in 55% stenosis. Mildly increased calcified plaque in the
proximal ICA results in less than 50% stenosis.

Vertebral arteries: The vertebral arteries are patent and
codominant. There is mild bilateral vertebral artery origin
stenosis.

Skeleton: Chronic periapical lucency associated with the right
mandibular first premolar tooth. New prominent periapical lucency
associated with the right mandibular second molar tooth. Advanced
cervical and upper thoracic disc and facet degeneration. Grade 1
anterolisthesis of C3 on C4, C7 on T1, and T1 on T2. Bilateral facet
ankylosis at C7-T1. Moderate multilevel osseous neural foraminal
stenosis in the cervical spine.

Other neck: No evidence of acute abnormality or mass.

Upper chest: Clear lung apices.
IMPRESSION: 1. Progressive atherosclerosis since [DATE]% mid left common carotid artery stenosis.
3. No significant ICA stenosis.
4. Mild bilateral vertebral artery origin stenosis.

Aortic Atherosclerosis ([id number]-[id number].T).

## 2020-11-02 DIAGNOSIS — G4733 Obstructive sleep apnea (adult) (pediatric): Secondary | ICD-10-CM | POA: Diagnosis not present

## 2020-11-11 NOTE — Progress Notes (Signed)
Subjective:   [name] is a 75 y.o. male who presents for Medicare Annual/Subsequent preventive examination.  I connected with Timothy Singleton today by telephone and verified that I am speaking with the correct person using two identifiers. Location patient: home Location provider: work Persons participating in the virtual visit: patient, provider.   I discussed the limitations, risks, security and privacy concerns of performing an evaluation and management service by telephone and the availability of in person appointments. I also discussed with the patient that there may be a patient responsible charge related to this service. The patient expressed understanding and verbally consented to this telephonic visit.    Interactive audio and video telecommunications were attempted between this provider and patient, however failed, due to patient having technical difficulties OR patient did not have access to video capability.  We continued and completed visit with audio only.   Review of Systems    N/A  Cardiac Risk Factors include: advanced age (>73men, >1 women);male gender;dyslipidemia;hypertension     Objective:    There were no vitals filed for this visit. There is no height or weight on file to calculate BMI.  Advanced Directives 11/12/2020 06/20/2020 11/17/2016 11/13/2016 05/25/2016 03/11/2016 12/10/2015  Does Patient Have a Medical Advance Directive? Yes Yes Yes Yes Yes No Yes  Type of Paramedic of [place];Living will Healthcare Power of [place] Living will [place];Living will [place];Living will - Living will;Healthcare Power of [place] in Chart? No - copy requested - - Yes - - -  Would patient like information on creating a medical advance directive? - - - - - No - patient declined information -    Current Medications (verified) Outpatient Encounter Medications as of  11/12/2020  Medication Sig  . aspirin 81 MG tablet Take 81 mg by mouth daily.  Marland Kitchen atorvastatin (LIPITOR) 80 MG tablet atorvastatin 80 mg tablet  TAKE 1 TABLET BY MOUTH EVERY DAY  . carbamazepine (CARBATROL) 200 MG 12 hr capsule TAKE 1 CAPSULE BY MOUTH TWICE A DAY  . lisinopril (ZESTRIL) 20 MG tablet Take 1 tablet by mouth daily.  . Omega-3 Fatty Acids (FISH OIL) 1000 MG CAPS Take 1 capsule by mouth daily at 6 (six) AM. Unknown dose  . pantoprazole (PROTONIX) 40 MG tablet TAKE 1 TABLET BY MOUTH EVERY DAY  . sertraline (ZOLOFT) 100 MG tablet Take 1 tablet (100 mg total) by mouth daily.  . tamsulosin (FLOMAX) 0.4 MG CAPS capsule TAKE 1 CAPSULE BY MOUTH EVERY DAY   No facility-administered encounter medications on file as of 11/12/2020.    Allergies (verified) Patient has no known allergies.   History: Past Medical History:  Diagnosis Date  . Arthritis   . Cancer ([place])    melanoma / knee  . Depression   . GERD (gastroesophageal reflux disease)   . Hyperlipemia   . Hypertension   . Past heart attack   . Sleep apnea    Past Surgical History:  Procedure Laterality Date  . BUNIONECTOMY    . COLONOSCOPY WITH PROPOFOL N/A 11/17/2016   Procedure: COLONOSCOPY WITH PROPOFOL;  Surgeon: Jonathon Bellows, MD;  Location: [facility] ENDOSCOPY;  Service: Endoscopy;  Laterality: N/A;  . CORONARY STENT PLACEMENT    . EXTRACORPOREAL SHOCK WAVE LITHOTRIPSY    . EXTRACORPOREAL SHOCK WAVE LITHOTRIPSY Right 12/30/2017   Procedure: EXTRACORPOREAL SHOCK WAVE LITHOTRIPSY (ESWL);  Surgeon: Hollice Espy, MD;  Location: [facility] ORS;  Service: Urology;  Laterality: Right;  . EXTRACORPOREAL SHOCK WAVE LITHOTRIPSY Right 06/20/2020   Procedure: EXTRACORPOREAL SHOCK WAVE LITHOTRIPSY (ESWL);  Surgeon: Vanna Scotland, MD;  Location: [facility] ORS;  Service: Urology;  Laterality: Right;  . HERNIA REPAIR     inguinal-right  . JOINT REPLACEMENT     total shoulder replacement  . KNEE SURGERY Right   . TONSILLECTOMY    . TOTAL  SHOULDER REPLACEMENT    . UPPER GI ENDOSCOPY  10/18/01   hiatus hernia  . VASECTOMY    . WRIST SURGERY     Family History  Problem Relation Age of Onset  . Cancer Mother   . Dementia Mother   . Stroke Father   . Heart disease Father   . Hypertension Father   . Breast cancer Sister   . Parkinson's disease Brother   . Hypertension Brother   . Melanoma Maternal Grandmother   . Bladder Cancer Neg Hx   . Prostate cancer Neg Hx   . Kidney cancer Neg Hx    Social History   Socioeconomic History  . Marital status: Married    Spouse name: Not on file  . Number of children: 2  . Years of education: Not on file  . Highest education level: Bachelor's degree (e.g., BA, AB, BS)  Occupational History  . Occupation: retired  Tobacco Use  . Smoking status: Former Games developer  . Smokeless tobacco: Former Neurosurgeon  . Tobacco comment: quit 40 years ago   Substance and Sexual Activity  . Alcohol use: Yes    Alcohol/week: 0.0 standard drinks    Comment: 2-4 xs per month / beer or mixed drink  . Drug use: No  . Sexual activity: Not on file  Other Topics Concern  . Not on file  Social History Narrative  . Not on file   Social Determinants of Health   Financial Resource Strain: Low Risk   . Difficulty of Paying Living Expenses: Not hard at all  Food Insecurity: No Food Insecurity  . Worried About Programme researcher, broadcasting/film/video in the Last Year: Never true  . Ran Out of Food in the Last Year: Never true  Transportation Needs: No Transportation Needs  . Lack of Transportation (Medical): No  . Lack of Transportation (Non-Medical): No  Physical Activity: Inactive  . Days of Exercise per Week: 0 days  . Minutes of Exercise per Session: 0 min  Stress: No Stress Concern Present  . Feeling of Stress : Not at all  Social Connections: Socially Integrated  . Frequency of Communication with Friends and Family: More than three times a week  . Frequency of Social Gatherings with Friends and Family: Twice a week   . Attends Religious Services: More than 4 times per year  . Active Member of Clubs or Organizations: Yes  . Attends Banker Meetings: More than 4 times per year  . Marital Status: Married    Tobacco Counseling Counseling given: Not Answered Comment: quit 40 years ago    Clinical Intake:  Pre-visit preparation completed: Yes  Pain : No/denies pain     Nutritional Risks: None Diabetes: No  How often do you need to have someone help you when you read instructions, pamphlets, or other written materials from your doctor or pharmacy?: 1 - Never  Diabetic? No  Interpreter Needed?: No  Information entered by :: MMarkoski, LPN   Activities of Daily Living In your present state of health, do you have any difficulty performing the following activities: 11/12/2020 07/08/2020  Hearing? N N  Vision? N N  Difficulty concentrating or making decisions? N N  Walking or climbing stairs? N N  Dressing or bathing? N N  Doing errands, shopping? N N  Preparing Food and eating ? N -  Using the Toilet? N -  In the past six months, have you accidently leaked urine? N -  Do you have problems with loss of bowel control? N -  Managing your Medications? N -  Managing your Finances? N -  Housekeeping or managing your Housekeeping? N -  Some recent data might be hidden    Patient Care Team: Jerrol Banana., MD as PCP - General (Family Medicine) Dingeldein, Remo Lipps, MD as Consulting Physician (Ophthalmology) Corey Skains, MD as Consulting Physician (Cardiology) Dasher, Rayvon Char, MD as Consulting Physician (Dermatology) Hollice Espy, MD as Consulting Physician (Urology) Lucky Cowboy Erskine Squibb, MD as Referring Physician (Vascular Surgery)  Indicate any recent Medical Services you may have received from other than [facility] providers in the past year (date may be approximate).     Assessment:   This is a routine wellness examination for Timothy Singleton.  Hearing/Vision screen No exam data  present  Dietary issues and exercise activities discussed: Current Exercise Habits: The patient does not participate in regular exercise at present, Exercise limited by: orthopedic condition(s)  Goals    . Increase water intake     Recommend to increase water intake to 6-8 8 oz glasses a day.      Depression Screen PHQ 2/9 Scores 11/12/2020 07/08/2020 10/05/2019 10/03/2018 03/30/2018 09/29/2017 11/13/2016  PHQ - 2 Score 0 0 0 0 0 0 0  PHQ- 9 Score - 2 3 2  0 1 -    Fall Risk Fall Risk  11/12/2020 07/08/2020 10/05/2019 09/13/2019 10/03/2018  Falls in the past year? 0 0 0 1 0  Comment - - - Emmi Telephone Survey: data to providers prior to load -  Number falls in past yr: 0 0 0 1 -  Comment - - - Emmi Telephone Survey Actual Response = 9 -  Injury with Fall? 0 0 0 0 -  Follow up - Falls evaluation completed Falls evaluation completed - Falls evaluation completed    [place]:  Any stairs in or around the home? Yes  If so, are there any without handrails? No  Home free of loose throw rugs in walkways, pet beds, electrical cords, etc? Yes  Adequate lighting in your home to reduce risk of falls? Yes   ASSISTIVE DEVICES UTILIZED TO PREVENT FALLS:  Life alert? No  Use of a cane, walker or w/c? No  Grab bars in the bathroom? No  Shower chair or bench in shower? Yes  Elevated toilet seat or a handicapped toilet? Yes   Cognitive Function: Normal cognitive status assessed by observation by this Nurse Health Advisor. No abnormalities found.       6CIT Screen 10/05/2019 11/13/2016  What Year? 0 points 0 points  What month? 0 points 0 points  What time? 0 points 0 points  Count back from 20 0 points 0 points  Months in reverse 0 points 0 points  Repeat phrase 0 points 0 points  Total Score 0 0    Immunizations Immunization History  Administered Date(s) Administered  . Fluad Quad(high Dose 65+) 07/08/2020  . Influenza, High Dose Seasonal PF  07/23/2015, 09/30/2016, 09/29/2017, 10/03/2018  . Pneumococcal Conjugate-13 07/11/2014  . Pneumococcal Polysaccharide-23 09/30/2016  . Td 09/20/2003  .  Tdap 01/04/2019  . Zoster 01/28/2010    TDAP status: Up to date  Flu Vaccine status: Up to date  Pneumococcal vaccine status: Up to date  Covid-19 vaccine status: Completed vaccines  Qualifies for Shingles Vaccine? Yes   Zostavax completed Yes   Shingrix Completed?: No.    Education has been provided regarding the importance of this vaccine. Patient has been advised to call insurance company to determine out of pocket expense if they have not yet received this vaccine. Advised may also receive vaccine at local pharmacy or Health Dept. Verbalized acceptance and understanding.  Screening Tests Health Maintenance  Topic Date Due  . COVID-19 Vaccine (1) Never done  . COLONOSCOPY (Pts 45-29yrs Insurance coverage will need to be confirmed)  11/17/2021  . TETANUS/TDAP  01/03/2029  . INFLUENZA VACCINE  Completed  . Hepatitis C Screening  Completed  . PNA vac Low Risk Adult  Completed    Health Maintenance  Health Maintenance Due  Topic Date Due  . COVID-19 Vaccine (1) Never done    Colorectal cancer screening: Type of screening: Colonoscopy. Completed 11/17/16. Repeat every 5 years  Lung Cancer Screening: (Low Dose CT Chest recommended if Age 23-80 years, 30 pack-year currently smoking OR have quit w/in 15years.) does not qualify.   Additional Screening:  Hepatitis C Screening: Up to date  Vision Screening: Recommended annual ophthalmology exams for early detection of glaucoma and other disorders of the eye. Is the patient up to date with their annual eye exam?  Yes  Who is the provider or what is the name of the office in which the patient attends annual eye exams? Dr Dingeldein @ [facility] If pt is not established with a provider, would they like to be referred to a provider to establish care? No .   Dental Screening: Recommended  annual dental exams for proper oral hygiene  Community Resource Referral / Chronic Care Management: CRR required this visit?  No   CCM required this visit?  No      Plan:     I have personally reviewed and noted the following in the patient's chart:   . Medical and social history . Use of alcohol, tobacco or illicit drugs  . Current medications and supplements . Functional ability and status . Nutritional status . Physical activity . Advanced directives . List of other physicians . Hospitalizations, surgeries, and ER visits in previous 12 months . Vitals . Screenings to include cognitive, depression, and falls . Referrals and appointments  In addition, I have reviewed and discussed with patient certain preventive protocols, quality metrics, and best practice recommendations. A written personalized care plan for preventive services as well as general preventive health recommendations were provided to patient.     Eleanna Theilen [place], [place]   [id number]   Nurse Notes: None.

## 2020-11-12 ENCOUNTER — Ambulatory Visit (INDEPENDENT_AMBULATORY_CARE_PROVIDER_SITE_OTHER): Payer: PPO

## 2020-11-12 ENCOUNTER — Other Ambulatory Visit: Payer: Self-pay

## 2020-11-12 DIAGNOSIS — Z0000 Encounter for general adult medical examination without abnormal findings: Secondary | ICD-10-CM | POA: Diagnosis not present

## 2020-11-12 NOTE — Patient Instructions (Signed)
Timothy Singleton , Thank you for taking time to come for your Medicare Wellness Visit. I appreciate your ongoing commitment to your health goals. Please review the following plan we discussed and let me know if I can assist you in the future.   Screening recommendations/referrals: Colonoscopy: Up to date, due 10/2021 Recommended yearly ophthalmology/optometry visit for glaucoma screening and checkup Recommended yearly dental visit for hygiene and checkup  Vaccinations: Influenza vaccine: Done 07/08/20 Pneumococcal vaccine: Completed series Tdap vaccine: Up to date, due 12/2028 Shingles vaccine: Shingrix discussed. Please contact your pharmacy for coverage information.     Advanced directives: Please bring a copy of your POA (Power of Attorney) and/or Living Will to your next appointment.   Conditions/risks identified: Recommend to increase water intake to 6-8 8 oz glasses a day.  Next appointment: 01/08/21 @ 10:00 AM with Dr Rosanna Randy. Declined scheduling an AWV for 2023 at this time.   Preventive Care 10 Years and Older, Male Preventive care refers to lifestyle choices and visits with your health care provider that can promote health and wellness. What does preventive care include?  A yearly physical exam. This is also called an annual well check.  Dental exams once or twice a year.  Routine eye exams. Ask your health care provider how often you should have your eyes checked.  Personal lifestyle choices, including:  Daily care of your teeth and gums.  Regular physical activity.  Eating a healthy diet.  Avoiding tobacco and drug use.  Limiting alcohol use.  Practicing safe sex.  Taking low doses of aspirin every day.  Taking vitamin and mineral supplements as recommended by your health care provider. What happens during an annual well check? The services and screenings done by your health care provider during your annual well check will depend on your age, overall health,  lifestyle risk factors, and family history of disease. Counseling  Your health care provider may ask you questions about your:  Alcohol use.  Tobacco use.  Drug use.  Emotional well-being.  Home and relationship well-being.  Sexual activity.  Eating habits.  History of falls.  Memory and ability to understand (cognition).  Work and work Statistician. Screening  You may have the following tests or measurements:  Height, weight, and BMI.  Blood pressure.  Lipid and cholesterol levels. These may be checked every 5 years, or more frequently if you are over 35 years old.  Skin check.  Lung cancer screening. You may have this screening every year starting at age 55 if you have a 30-pack-year history of smoking and currently smoke or have quit within the past 15 years.  Fecal occult blood test (FOBT) of the stool. You may have this test every year starting at age 80.  Flexible sigmoidoscopy or colonoscopy. You may have a sigmoidoscopy every 5 years or a colonoscopy every 10 years starting at age 55.  Prostate cancer screening. Recommendations will vary depending on your family history and other risks.  Hepatitis C blood test.  Hepatitis B blood test.  Sexually transmitted disease (STD) testing.  Diabetes screening. This is done by checking your blood sugar (glucose) after you have not eaten for a while (fasting). You may have this done every 1-3 years.  Abdominal aortic aneurysm (AAA) screening. You may need this if you are a current or former smoker.  Osteoporosis. You may be screened starting at age 69 if you are at high risk. Talk with your health care provider about your test results, treatment options, and  if necessary, the need for more tests. Vaccines  Your health care provider may recommend certain vaccines, such as:  Influenza vaccine. This is recommended every year.  Tetanus, diphtheria, and acellular pertussis (Tdap, Td) vaccine. You may need a Td booster  every 10 years.  Zoster vaccine. You may need this after age 68.  Pneumococcal 13-valent conjugate (PCV13) vaccine. One dose is recommended after age 71.  Pneumococcal polysaccharide (PPSV23) vaccine. One dose is recommended after age 26. Talk to your health care provider about which screenings and vaccines you need and how often you need them. This information is not intended to replace advice given to you by your health care provider. Make sure you discuss any questions you have with your health care provider. Document Released: 11/01/2015 Document Revised: 06/24/2016 Document Reviewed: 08/06/2015 Elsevier Interactive Patient Education  2017 [place] Prevention in the Home Falls can cause injuries. They can happen to people of all ages. There are many things you can do to make your home safe and to help prevent falls. What can I do on the outside of my home?  Regularly fix the edges of walkways and driveways and fix any cracks.  Remove anything that might make you trip as you walk through a door, such as a raised step or threshold.  Trim any bushes or trees on the path to your home.  Use bright outdoor lighting.  Clear any walking paths of anything that might make someone trip, such as rocks or tools.  Regularly check to see if handrails are loose or broken. Make sure that both sides of any steps have handrails.  Any raised decks and porches should have guardrails on the edges.  Have any leaves, snow, or ice cleared regularly.  Use sand or salt on walking paths during winter.  Clean up any spills in your garage right away. This includes oil or grease spills. What can I do in the bathroom?  Use night lights.  Install grab bars by the toilet and in the tub and shower. Do not use towel bars as grab bars.  Use non-skid mats or decals in the tub or shower.  If you need to sit down in the shower, use a plastic, non-slip stool.  Keep the floor dry. Clean up any  water that spills on the floor as soon as it happens.  Remove soap buildup in the tub or shower regularly.  Attach bath mats securely with double-sided non-slip rug tape.  Do not have throw rugs and other things on the floor that can make you trip. What can I do in the bedroom?  Use night lights.  Make sure that you have a light by your bed that is easy to reach.  Do not use any sheets or blankets that are too big for your bed. They should not hang down onto the floor.  Have a firm chair that has side arms. You can use this for support while you get dressed.  Do not have throw rugs and other things on the floor that can make you trip. What can I do in the kitchen?  Clean up any spills right away.  Avoid walking on wet floors.  Keep items that you use a lot in easy-to-reach places.  If you need to reach something above you, use a strong step stool that has a grab bar.  Keep electrical cords out of the way.  Do not use floor polish or wax that makes floors slippery. If you  must use wax, use non-skid floor wax.  Do not have throw rugs and other things on the floor that can make you trip. What can I do with my stairs?  Do not leave any items on the stairs.  Make sure that there are handrails on both sides of the stairs and use them. Fix handrails that are broken or loose. Make sure that handrails are as long as the stairways.  Check any carpeting to make sure that it is firmly attached to the stairs. Fix any carpet that is loose or worn.  Avoid having throw rugs at the top or bottom of the stairs. If you do have throw rugs, attach them to the floor with carpet tape.  Make sure that you have a light switch at the top of the stairs and the bottom of the stairs. If you do not have them, ask someone to add them for you. What else can I do to help prevent falls?  Wear shoes that:  Do not have high heels.  Have rubber bottoms.  Are comfortable and fit you well.  Are closed  at the toe. Do not wear sandals.  If you use a stepladder:  Make sure that it is fully opened. Do not climb a closed stepladder.  Make sure that both sides of the stepladder are locked into place.  Ask someone to hold it for you, if possible.  Clearly mark and make sure that you can see:  Any grab bars or handrails.  First and last steps.  Where the edge of each step is.  Use tools that help you move around (mobility aids) if they are needed. These include:  Canes.  Walkers.  Scooters.  Crutches.  Turn on the lights when you go into a dark area. Replace any light bulbs as soon as they burn out.  Set up your furniture so you have a clear path. Avoid moving your furniture around.  If any of your floors are uneven, fix them.  If there are any pets around you, be aware of where they are.  Review your medicines with your doctor. Some medicines can make you feel dizzy. This can increase your chance of falling. Ask your doctor what other things that you can do to help prevent falls. This information is not intended to replace advice given to you by your health care provider. Make sure you discuss any questions you have with your health care provider. Document Released: 08/01/2009 Document Revised: 03/12/2016 Document Reviewed: 11/09/2014 Elsevier Interactive Patient Education  2017 [employer].

## 2020-12-03 DIAGNOSIS — Z96651 Presence of right artificial knee joint: Secondary | ICD-10-CM | POA: Diagnosis not present

## 2020-12-03 DIAGNOSIS — M1711 Unilateral primary osteoarthritis, right knee: Secondary | ICD-10-CM | POA: Diagnosis not present

## 2020-12-10 ENCOUNTER — Ambulatory Visit (INDEPENDENT_AMBULATORY_CARE_PROVIDER_SITE_OTHER): Payer: PPO

## 2020-12-10 ENCOUNTER — Ambulatory Visit (INDEPENDENT_AMBULATORY_CARE_PROVIDER_SITE_OTHER): Payer: PPO | Admitting: Nurse Practitioner

## 2020-12-10 ENCOUNTER — Other Ambulatory Visit: Payer: Self-pay

## 2020-12-10 ENCOUNTER — Encounter (INDEPENDENT_AMBULATORY_CARE_PROVIDER_SITE_OTHER): Payer: Self-pay | Admitting: Nurse Practitioner

## 2020-12-10 VITALS — BP 135/75 | HR 60 | Ht 71.0 in | Wt 178.0 lb

## 2020-12-10 DIAGNOSIS — I10 Essential (primary) hypertension: Secondary | ICD-10-CM

## 2020-12-10 DIAGNOSIS — E782 Mixed hyperlipidemia: Secondary | ICD-10-CM

## 2020-12-10 DIAGNOSIS — I6523 Occlusion and stenosis of bilateral carotid arteries: Secondary | ICD-10-CM | POA: Diagnosis not present

## 2020-12-15 ENCOUNTER — Encounter (INDEPENDENT_AMBULATORY_CARE_PROVIDER_SITE_OTHER): Payer: Self-pay | Admitting: Nurse Practitioner

## 2020-12-15 NOTE — Progress Notes (Signed)
Subjective:    Patient ID: Timothy Singleton, male    DOB: 07/21/1946, 75 y.o.   MRN: [id number] Chief Complaint  Patient presents with  . Follow-up    6 mo   . Carotid    The patient is seen for follow up evaluation of carotid stenosis. The carotid stenosis followed by ultrasound.   The patient denies amaurosis fugax. There is no recent history of TIA symptoms or focal motor deficits. There is no prior documented CVA.  The patient is taking enteric-coated aspirin 81 mg daily.  There is no history of migraine headaches. There is no history of seizures.  The patient has a history of coronary artery disease, no recent episodes of angina or shortness of breath. The patient denies PAD or claudication symptoms. There is a history of hyperlipidemia which is being treated with a statin.    Carotid Duplex done today shows 1 to 39% stenosis of the bilateral internal carotid arteries.  Left common carotid arteries have velocities consistent with about 60% stenosis.  No change compared to last study in 06/07/2020   Review of Systems  Eyes: Negative for visual disturbance.  All other systems reviewed and are negative.      Objective:   Physical Exam Vitals reviewed.  HENT:     Head: Normocephalic.  Neck:     Vascular: No carotid bruit.  Cardiovascular:     Rate and Rhythm: Normal rate and regular rhythm.     Pulses: Normal pulses.  Pulmonary:     Effort: Pulmonary effort is normal.  Neurological:     Mental Status: He is alert and oriented to person, place, and time.  Psychiatric:        Mood and Affect: Mood normal.        Behavior: Behavior normal.        Thought Content: Thought content normal.        Judgment: Judgment normal.     BP 135/75   Pulse 60   Ht 5\' 11"  (1.803 m)   Wt 178 lb (80.7 kg)   BMI 24.83 kg/m   Past Medical History:  Diagnosis Date  . Arthritis   . Cancer ([place])    melanoma / knee  . Depression   . GERD (gastroesophageal reflux disease)    . Hyperlipemia   . Hypertension   . Past heart attack   . Sleep apnea     Social History   Socioeconomic History  . Marital status: Married    Spouse name: Not on file  . Number of children: 2  . Years of education: Not on file  . Highest education level: Bachelor's degree (e.g., BA, AB, BS)  Occupational History  . Occupation: retired  Tobacco Use  . Smoking status: Former Research scientist (life sciences)  . Smokeless tobacco: Former Systems developer  . Tobacco comment: quit 40 years ago   Substance and Sexual Activity  . Alcohol use: Yes    Alcohol/week: 0.0 standard drinks    Comment: 2-4 xs per month / beer or mixed drink  . Drug use: No  . Sexual activity: Not on file  Other Topics Concern  . Not on file  Social History Narrative  . Not on file   Social Determinants of Health   Financial Resource Strain: Low Risk   . Difficulty of Paying Living Expenses: Not hard at all  Food Insecurity: No Food Insecurity  . Worried About Charity fundraiser in the Last Year: Never true  . Ran  Out of Food in the Last Year: Never true  Transportation Needs: No Transportation Needs  . Lack of Transportation (Medical): No  . Lack of Transportation (Non-Medical): No  Physical Activity: Inactive  . Days of Exercise per Week: 0 days  . Minutes of Exercise per Session: 0 min  Stress: No Stress Concern Present  . Feeling of Stress : Not at all  Social Connections: Socially Integrated  . Frequency of Communication with Friends and Family: More than three times a week  . Frequency of Social Gatherings with Friends and Family: Twice a week  . Attends Religious Services: More than 4 times per year  . Active Member of Clubs or Organizations: Yes  . Attends Archivist Meetings: More than 4 times per year  . Marital Status: Married  Human resources officer Violence: Not At Risk  . Fear of Current or Ex-Partner: No  . Emotionally Abused: No  . Physically Abused: No  . Sexually Abused: No    Past Surgical History:   Procedure Laterality Date  . BUNIONECTOMY    . COLONOSCOPY WITH PROPOFOL N/A 11/17/2016   Procedure: COLONOSCOPY WITH PROPOFOL;  Surgeon: Jonathon Bellows, MD;  Location: [facility] END[facility];  Service: Endoscopy;  Laterality: N/A;  . CORONARY STENT PLACEMENT    . EXTRACORPOREAL SHOCK WAVE LITHOTRIPSY    . EXTRACORPOREAL SHOCK WAVE LITHOTRIPSY Right 12/30/2017   Procedure: EXTRACORPOREAL SHOCK WAVE LITHOTRIPSY (ESWL);  Surgeon: Hollice Espy, MD;  Location: [facility] ORS;  Service: Urology;  Laterality: Right;  . EXTRACORPOREAL SHOCK WAVE LITHOTRIPSY Right 06/20/2020   Procedure: EXTRACORPOREAL SHOCK WAVE LITHOTRIPSY (ESWL);  Surgeon: Hollice Espy, MD;  Location: [facility] ORS;  Service: Urology;  Laterality: Right;  . HERNIA REPAIR     inguinal-right  . JOINT REPLACEMENT     total shoulder replacement  . KNEE SURGERY Right   . TONSILLECTOMY    . TOTAL SHOULDER REPLACEMENT    . UPPER GI ENDOSCOPY  10/18/01   hiatus hernia  . VASECTOMY    . WRIST SURGERY      Family History  Problem Relation Age of Onset  . Cancer Mother   . Dementia Mother   . Stroke Father   . Heart disease Father   . Hypertension Father   . Breast cancer Sister   . Parkinson's disease Brother   . Hypertension Brother   . Melanoma Maternal Grandmother   . Bladder Cancer Neg Hx   . Prostate cancer Neg Hx   . Kidney cancer Neg Hx     No Known Allergies  CBC Latest Ref Rng & Units 07/08/2020 10/06/2019 10/03/2018  WBC 3.4 - 10.8 x10E3/uL 4.9 5.7 5.2  Hemoglobin 13.0 - 17.7 g/dL 13.2 14.8 13.6  Hematocrit 37.5 - 51.0 % 39.9 43.1 40.8  Platelets 150 - 450 x10E3/uL 220 210 245      CMP     Component Value Date/Time   NA 142 07/08/2020 0906   K 4.7 07/08/2020 0906   CL 105 07/08/2020 0906   CO2 23 07/08/2020 0906   GLUCOSE 102 (H) 07/08/2020 0906   GLUCOSE 99 10/04/2017 0935   BUN 22 07/08/2020 0906   CREATININE 1.06 07/08/2020 0906   CREATININE 1.18 10/04/2017 0935   CALCIUM 9.7 07/08/2020 0906   PROT 5.9 (L)  07/08/2020 0906   ALBUMIN 4.1 07/08/2020 0906   AST 20 07/08/2020 0906   ALT 15 07/08/2020 0906   ALKPHOS 86 07/08/2020 0906   BILITOT <0.2 07/08/2020 0906   GFRNONAA 68 07/08/2020 0906  GFRNONAA 61 10/04/2017 0935   GFRAA 79 07/08/2020 0906   GFRAA 71 10/04/2017 0935     No results found.     Assessment & Plan:   1. Bilateral carotid artery stenosis Today the velocities in the bilateral internal carotid arteries show a 1 to 39% stenosis.  Previous CT angiogram noted velocities of the left common carotid artery to be around 60%.  The patient's previous studies were consistent with velocities that would indicate a 60% stenosis.  The velocities are largely unchanged today.  Therefore we will continue with close follow-up.  We will have the patient return to the office in 6 months.  He will also continue with medical management.  2. Essential (primary) hypertension Continue antihypertensive medications as already ordered, these medications have been reviewed and there are no changes at this time.   3. Combined fat and carbohydrate induced hyperlipemia Continue statin as ordered and reviewed, no changes at this time    Current Outpatient Medications on File Prior to Visit  Medication Sig Dispense Refill  . aspirin 81 MG tablet Take 81 mg by mouth daily.    Marland Kitchen atorvastatin (LIPITOR) 80 MG tablet atorvastatin 80 mg tablet  TAKE 1 TABLET BY MOUTH EVERY DAY    . carbamazepine (CARBATROL) 200 MG 12 hr capsule TAKE 1 CAPSULE BY MOUTH TWICE A DAY 180 capsule 2  . Cholecalciferol 50 MCG (2000 UT) CHEW     . lisinopril (ZESTRIL) 20 MG tablet Take 1 tablet by mouth daily.    . Omega-3 Fatty Acids (FISH OIL) 1000 MG CAPS Take 1 capsule by mouth daily at 6 (six) AM. Unknown dose    . pantoprazole (PROTONIX) 40 MG tablet TAKE 1 TABLET BY MOUTH EVERY DAY 90 tablet 2  . sertraline (ZOLOFT) 100 MG tablet Take 1 tablet (100 mg total) by mouth daily. 90 tablet 1  . tamsulosin (FLOMAX) 0.4 MG CAPS  capsule TAKE 1 CAPSULE BY MOUTH EVERY DAY 90 capsule 4   No current facility-administered medications on file prior to visit.    There are no Patient Instructions on file for this visit. No follow-ups on file.   Kris Hartmann, NP

## 2020-12-23 DIAGNOSIS — M25561 Pain in right knee: Secondary | ICD-10-CM | POA: Diagnosis not present

## 2020-12-23 DIAGNOSIS — Z96651 Presence of right artificial knee joint: Secondary | ICD-10-CM | POA: Insufficient documentation

## 2020-12-24 DIAGNOSIS — H16251 Phlyctenular keratoconjunctivitis, right eye: Secondary | ICD-10-CM | POA: Diagnosis not present

## 2020-12-25 DIAGNOSIS — L570 Actinic keratosis: Secondary | ICD-10-CM | POA: Diagnosis not present

## 2020-12-25 DIAGNOSIS — D225 Melanocytic nevi of trunk: Secondary | ICD-10-CM | POA: Diagnosis not present

## 2020-12-25 DIAGNOSIS — D2271 Melanocytic nevi of right lower limb, including hip: Secondary | ICD-10-CM | POA: Diagnosis not present

## 2020-12-25 DIAGNOSIS — L820 Inflamed seborrheic keratosis: Secondary | ICD-10-CM | POA: Diagnosis not present

## 2020-12-25 DIAGNOSIS — Z85828 Personal history of other malignant neoplasm of skin: Secondary | ICD-10-CM | POA: Diagnosis not present

## 2020-12-25 DIAGNOSIS — X32XXXA Exposure to sunlight, initial encounter: Secondary | ICD-10-CM | POA: Diagnosis not present

## 2020-12-25 DIAGNOSIS — L538 Other specified erythematous conditions: Secondary | ICD-10-CM | POA: Diagnosis not present

## 2020-12-25 DIAGNOSIS — Z86006 Personal history of melanoma in-situ: Secondary | ICD-10-CM | POA: Diagnosis not present

## 2020-12-25 DIAGNOSIS — Z85820 Personal history of malignant melanoma of skin: Secondary | ICD-10-CM | POA: Diagnosis not present

## 2021-01-01 ENCOUNTER — Other Ambulatory Visit: Payer: Self-pay | Admitting: Family Medicine

## 2021-01-01 NOTE — Telephone Encounter (Signed)
Requested Prescriptions  Pending Prescriptions Disp Refills  . tamsulosin (FLOMAX) 0.4 MG CAPS capsule [Pharmacy Med Name: TAMSULOSIN HCL 0.4 MG CAPSULE] 30 capsule 0    Sig: TAKE 1 CAPSULE BY MOUTH EVERY DAY     Urology: Alpha-Adrenergic Blocker Passed - 01/01/2021  1:28 AM      Passed - Last BP in normal range    BP Readings from Last 1 Encounters:  12/10/20 135/75         Passed - Valid encounter within last 12 months    Recent Outpatient Visits          5 months ago Need for immunization against influenza   [facility] Jerrol Banana., MD   12 months ago Arteriosclerosis of coronary artery   [facility] Jerrol Banana., MD   1 year ago Annual physical exam   [facility] Jerrol Banana., MD   1 year ago Depression, unspecified depression type   [facility] Jerrol Banana., MD   2 years ago Annual physical exam   [facility] Jerrol Banana., MD      Future Appointments            In 1 week Jerrol Banana., MD [facility], [place]

## 2021-01-03 ENCOUNTER — Other Ambulatory Visit: Payer: Self-pay | Admitting: Family Medicine

## 2021-01-08 ENCOUNTER — Encounter: Payer: Self-pay | Admitting: Family Medicine

## 2021-03-04 ENCOUNTER — Telehealth: Payer: Self-pay | Admitting: Family Medicine

## 2021-03-04 DIAGNOSIS — F32A Depression, unspecified: Secondary | ICD-10-CM

## 2021-03-04 MED ORDER — SERTRALINE HCL 100 MG PO TABS: 100.0000 mg | ORAL_TABLET | Freq: Every day | ORAL | 1 refills | Status: DC

## 2021-03-04 NOTE — Telephone Encounter (Signed)
[facility] faxed refill request for the following medications:  sertraline (ZOLOFT) 100 MG tablet  90 day supply  Last Rx: 06/28/20 Qty: 90 Refills: 1 LOV: 07/08/20 NOV: 04/24/21 Please advise. Thanks TNP

## 2021-03-06 DIAGNOSIS — G4733 Obstructive sleep apnea (adult) (pediatric): Secondary | ICD-10-CM | POA: Diagnosis not present

## 2021-03-19 ENCOUNTER — Other Ambulatory Visit: Payer: Self-pay | Admitting: Family Medicine

## 2021-03-19 DIAGNOSIS — K219 Gastro-esophageal reflux disease without esophagitis: Secondary | ICD-10-CM

## 2021-03-19 NOTE — Telephone Encounter (Signed)
Requested Prescriptions  Pending Prescriptions Disp Refills  . pantoprazole (PROTONIX) 40 MG tablet [Pharmacy Med Name: PANTOPRAZOLE SOD DR 40 MG TAB] 90 tablet 0    Sig: TAKE 1 TABLET BY MOUTH EVERY DAY     Gastroenterology: Proton Pump Inhibitors Passed - 03/19/2021  1:37 AM      Passed - Valid encounter within last 12 months    Recent Outpatient Visits          8 months ago Need for immunization against influenza   [facility] Jerrol Banana., MD   1 year ago Arteriosclerosis of coronary artery   [facility] Jerrol Banana., MD   1 year ago Annual physical exam   [facility] Jerrol Banana., MD   2 years ago Depression, unspecified depression type   [facility] Jerrol Banana., MD   2 years ago Annual physical exam   [facility] Jerrol Banana., MD      Future Appointments            In 1 month Jerrol Banana., MD [facility], [place]

## 2021-03-31 DIAGNOSIS — C44729 Squamous cell carcinoma of skin of left lower limb, including hip: Secondary | ICD-10-CM | POA: Diagnosis not present

## 2021-03-31 DIAGNOSIS — D0471 Carcinoma in situ of skin of right lower limb, including hip: Secondary | ICD-10-CM | POA: Diagnosis not present

## 2021-03-31 DIAGNOSIS — D485 Neoplasm of uncertain behavior of skin: Secondary | ICD-10-CM | POA: Diagnosis not present

## 2021-03-31 DIAGNOSIS — R208 Other disturbances of skin sensation: Secondary | ICD-10-CM | POA: Diagnosis not present

## 2021-04-07 DIAGNOSIS — E291 Testicular hypofunction: Secondary | ICD-10-CM | POA: Diagnosis not present

## 2021-04-10 DIAGNOSIS — G479 Sleep disorder, unspecified: Secondary | ICD-10-CM | POA: Diagnosis not present

## 2021-04-10 DIAGNOSIS — E291 Testicular hypofunction: Secondary | ICD-10-CM | POA: Diagnosis not present

## 2021-04-10 DIAGNOSIS — Z6824 Body mass index (BMI) 24.0-24.9, adult: Secondary | ICD-10-CM | POA: Diagnosis not present

## 2021-04-10 DIAGNOSIS — R5383 Other fatigue: Secondary | ICD-10-CM | POA: Diagnosis not present

## 2021-04-24 ENCOUNTER — Ambulatory Visit (INDEPENDENT_AMBULATORY_CARE_PROVIDER_SITE_OTHER): Payer: PPO | Admitting: Family Medicine

## 2021-04-24 ENCOUNTER — Other Ambulatory Visit: Payer: Self-pay

## 2021-04-24 ENCOUNTER — Encounter: Payer: Self-pay | Admitting: Family Medicine

## 2021-04-24 VITALS — BP 122/63 | HR 74 | Temp 98.6°F | Resp 16 | Ht 71.0 in | Wt 178.0 lb

## 2021-04-24 DIAGNOSIS — K219 Gastro-esophageal reflux disease without esophagitis: Secondary | ICD-10-CM | POA: Diagnosis not present

## 2021-04-24 DIAGNOSIS — N400 Enlarged prostate without lower urinary tract symptoms: Secondary | ICD-10-CM

## 2021-04-24 DIAGNOSIS — Z0000 Encounter for general adult medical examination without abnormal findings: Secondary | ICD-10-CM

## 2021-04-24 DIAGNOSIS — I10 Essential (primary) hypertension: Secondary | ICD-10-CM

## 2021-04-24 MED ORDER — SUCRALFATE 1 G PO TABS: 1.0000 g | ORAL_TABLET | Freq: Three times a day (TID) | ORAL | 0 refills | Status: DC

## 2021-04-24 NOTE — Progress Notes (Signed)
Complete physical exam   Patient: Timothy Singleton   DOB: 1946-06-02   75 y.o. Male  MRN: [id number] Visit Date: 04/24/2021  Today's healthcare provider: Wilhemena Durie, MD   Chief Complaint  Patient presents with   Annual Exam   Subjective    Timothy Singleton is a 75 y.o. male who presents today for a complete physical exam.  He reports consuming a general diet. Home exercise routine includes walking. He generally feels well. He reports sleeping well. He does not have additional problems to discuss today.  Overall patient feels well.  Followed by Dr. Evorn Gong for very fair skin/dermatology. He does have recent irritation of his throat and nose and this is all postprandial. No cough no chest pain.  He states it is a burning type sensation.  Past Medical History:  Diagnosis Date   Arthritis    Cancer ([place])    melanoma / knee   Depression    GERD (gastroesophageal reflux disease)    Hyperlipemia    Hypertension    Past heart attack    Sleep apnea    Past Surgical History:  Procedure Laterality Date   BUNIONECTOMY     COLONOSCOPY WITH PROPOFOL N/A 11/17/2016   Procedure: COLONOSCOPY WITH PROPOFOL;  Surgeon: Jonathon Bellows, MD;  Location: [facility] ENDOSCOPY;  Service: Endoscopy;  Laterality: N/A;   CORONARY STENT PLACEMENT     EXTRACORPOREAL SHOCK WAVE LITHOTRIPSY     EXTRACORPOREAL SHOCK WAVE LITHOTRIPSY Right 12/30/2017   Procedure: EXTRACORPOREAL SHOCK WAVE LITHOTRIPSY (ESWL);  Surgeon: Hollice Espy, MD;  Location: [facility] ORS;  Service: Urology;  Laterality: Right;   EXTRACORPOREAL SHOCK WAVE LITHOTRIPSY Right 06/20/2020   Procedure: EXTRACORPOREAL SHOCK WAVE LITHOTRIPSY (ESWL);  Surgeon: Hollice Espy, MD;  Location: [facility] ORS;  Service: Urology;  Laterality: Right;   HERNIA REPAIR     inguinal-right   JOINT REPLACEMENT     total shoulder replacement   KNEE SURGERY Right    TONSILLECTOMY     TOTAL SHOULDER REPLACEMENT     UPPER GI ENDOSCOPY  10/18/01   hiatus  hernia   VASECTOMY     WRIST SURGERY     Social History   Socioeconomic History   Marital status: Married    Spouse name: Not on file   Number of children: 2   Years of education: Not on file   Highest education level: Bachelor's degree (e.g., BA, AB, BS)  Occupational History   Occupation: retired  Tobacco Use   Smoking status: Former    Pack years: 0.00   Smokeless tobacco: Former   Tobacco comments:    quit 40 years ago   Substance and Sexual Activity   Alcohol use: Yes    Alcohol/week: 0.0 standard drinks    Comment: 2-4 xs per month / beer or mixed drink   Drug use: No   Sexual activity: Not on file  Other Topics Concern   Not on file  Social History Narrative   Not on file   Social Determinants of Health   Financial Resource Strain: Low Risk    Difficulty of Paying Living Expenses: Not hard at all  Food Insecurity: No Food Insecurity   Worried About Charity fundraiser in the Last Year: Never true   [place] in the Last Year: Never true  Transportation Needs: No Transportation Needs   Lack of Transportation (Medical): No   Lack of Transportation (Non-Medical): No  Physical Activity: Inactive   Days  of Exercise per Week: 0 days   Minutes of Exercise per Session: 0 min  Stress: No Stress Concern Present   Feeling of Stress : Not at all  Social Connections: Socially Integrated   Frequency of Communication with Friends and Family: More than three times a week   Frequency of Social Gatherings with Friends and Family: Twice a week   Attends Religious Services: More than 4 times per year   Active Member of [employer] or Organizations: Yes   Attends Music therapist: More than 4 times per year   Marital Status: Married  Human resources officer Violence: Not At Risk   Fear of Current or Ex-Partner: No   Emotionally Abused: No   Physically Abused: No   Sexually Abused: No   Family Status  Relation Name Status   Mother  Deceased at age 59   Father   Deceased at age 97   Sister  Alive   Brother  Alive   Daughter  Alive   Son  Alive   Brother  Deceased at age 75   Brother  Alive   MGM  (Not Specified)   Neg Hx  (Not Specified)   Family History  Problem Relation Age of Onset   Cancer Mother    Dementia Mother    Stroke Father    Heart disease Father    Hypertension Father    Breast cancer Sister    Parkinson's disease Brother    Hypertension Brother    Melanoma Maternal Grandmother    Bladder Cancer Neg Hx    Prostate cancer Neg Hx    Kidney cancer Neg Hx    No Known Allergies  Patient Care Team: Jerrol Banana., MD as PCP - General (Family Medicine) Dingeldein, Remo Lipps, MD as Consulting Physician (Ophthalmology) Corey Skains, MD as Consulting Physician (Cardiology) Dasher, Rayvon Char, MD as Consulting Physician (Dermatology) Hollice Espy, MD as Consulting Physician (Urology) Lucky Cowboy, Erskine Squibb, MD as Referring Physician (Vascular Surgery)   Medications: Outpatient Medications Prior to Visit  Medication Sig   aspirin 81 MG tablet Take 81 mg by mouth daily.   atorvastatin (LIPITOR) 80 MG tablet atorvastatin 80 mg tablet  TAKE 1 TABLET BY MOUTH EVERY DAY   carbamazepine (CARBATROL) 200 MG 12 hr capsule TAKE 1 CAPSULE BY MOUTH TWICE A DAY   Cholecalciferol 50 MCG (2000 UT) CHEW    lisinopril (ZESTRIL) 20 MG tablet Take 1 tablet by mouth daily.   Omega-3 Fatty Acids (FISH OIL) 1000 MG CAPS Take 1 capsule by mouth daily at 6 (six) AM. Unknown dose   pantoprazole (PROTONIX) 40 MG tablet TAKE 1 TABLET BY MOUTH EVERY DAY   sertraline (ZOLOFT) 100 MG tablet Take 1 tablet (100 mg total) by mouth daily.   tamsulosin (FLOMAX) 0.4 MG CAPS capsule TAKE 1 CAPSULE BY MOUTH EVERY DAY   No facility-administered medications prior to visit.    Review of Systems  Constitutional: Negative.   HENT: Negative.    Eyes: Negative.   Respiratory: Negative.    Cardiovascular: Negative.   Gastrointestinal: Negative.   Endocrine:  Negative.   Genitourinary: Negative.   Musculoskeletal:  Positive for arthralgias.  Skin: Negative.   Allergic/Immunologic: Negative.   Neurological: Negative.   Hematological: Negative.   Psychiatric/Behavioral: Negative.        Objective    BP 122/63   Pulse 74   Temp 98.6 F (37 C)   Resp 16   Ht 5\' 11"  (1.803 m)  Wt 178 lb (80.7 kg)   BMI 24.83 kg/m  BP Readings from Last 3 Encounters:  04/24/21 122/63  12/10/20 135/75  07/08/20 123/62   Wt Readings from Last 3 Encounters:  04/24/21 178 lb (80.7 kg)  12/10/20 178 lb (80.7 kg)  07/08/20 175 lb (79.4 kg)      Physical Exam Vitals reviewed.  Constitutional:      Appearance: He is well-developed.  HENT:     Head: Normocephalic and atraumatic.     Right Ear: External ear normal.     Left Ear: External ear normal.     Nose: Nose normal.  Eyes:     General: No scleral icterus.    Conjunctiva/sclera: Conjunctivae normal.     Pupils: Pupils are equal, round, and reactive to light.  Neck:     Thyroid: No thyromegaly.  Cardiovascular:     Rate and Rhythm: Normal rate and regular rhythm.     Heart sounds: Normal heart sounds.  Pulmonary:     Effort: Pulmonary effort is normal.     Breath sounds: Normal breath sounds.  Abdominal:     Palpations: Abdomen is soft.  Genitourinary:    Penis: Normal.      Testes: Normal.     Prostate: Normal.     Rectum: Normal.     Comments: Defer DRE Musculoskeletal:        General: Normal range of motion.     Cervical back: Neck supple.  Lymphadenopathy:     Cervical: No cervical adenopathy.  Skin:    General: Skin is warm and dry.     Comments: Very fair skin.  Neurological:     General: No focal deficit present.     Mental Status: He is alert and oriented to person, place, and time.     Deep Tendon Reflexes: Reflexes are normal and symmetric.  Psychiatric:        Mood and Affect: Mood normal.        Behavior: Behavior normal.        Thought Content: Thought content  normal.        Judgment: Judgment normal.      Last depression screening scores PHQ 2/9 Scores 04/24/2021 11/12/2020 07/08/2020  PHQ - 2 Score 0 0 0  PHQ- 9 Score 2 - 2   Last fall risk screening Fall Risk  04/24/2021  Falls in the past year? 0  Comment -  Number falls in past yr: 0  Comment -  Injury with Fall? 0  Risk for fall due to : No Fall Risks  Follow up Falls evaluation completed   Last Audit-C alcohol use screening Alcohol Use Disorder Test (AUDIT) 04/24/2021  1. How often do you have a drink containing alcohol? 2  2. How many drinks containing alcohol do you have on a typical day when you are drinking? 0  3. How often do you have six or more drinks on one occasion? 0  AUDIT-C Score 2  4. How often during the last year have you found that you were not able to stop drinking once you had started? -  5. How often during the last year have you failed to do what was normally expected from you because of drinking? -  6. How often during the last year have you needed a first drink in the morning to get yourself going after a heavy drinking session? -  7. How often during the last year have you had a feeling of guilt  of remorse after drinking? -  8. How often during the last year have you been unable to remember what happened the night before because you had been drinking? -  9. Have you or someone else been injured as a result of your drinking? -  10. Has a relative or friend or a doctor or another health worker been concerned about your drinking or suggested you cut down? -  Alcohol Use Disorder Identification Test Final Score (AUDIT) -  Alcohol Brief Interventions/Follow-up -   A score of 3 or more in women, and 4 or more in men indicates increased risk for alcohol abuse, EXCEPT if all of the points are from question 1   No results found for any visits on 04/24/21.  Assessment & Plan    Routine Health Maintenance and Physical Exam  Exercise Activities and Dietary  recommendations  Goals      Increase water intake     Recommend to increase water intake to 6-8 8 oz glasses a day.         Immunization History  Administered Date(s) Administered   Fluad Quad(high Dose 65+) 07/08/2020   Influenza, High Dose Seasonal PF 07/23/2015, 09/30/2016, 09/29/2017, 10/03/2018   PFIZER(Purple Top)SARS-COV-2 Vaccination 11/29/2019, 12/20/2019   Pneumococcal Conjugate-13 07/11/2014   Pneumococcal Polysaccharide-23 09/30/2016   Td 09/20/2003   Tdap 01/04/2019   Zoster, Live 01/28/2010    Health Maintenance  Topic Date Due   Zoster Vaccines- Shingrix (1 of 2) Never done   COVID-19 Vaccine (3 - Pfizer risk series) 01/17/2020   INFLUENZA VACCINE  05/19/2021   COLONOSCOPY (Pts 45-44yrs Insurance coverage will need to be confirmed)  11/17/2021   TETANUS/TDAP  01/03/2029   Hepatitis C Screening  Completed   PNA vac Low Risk Adult  Completed   HPV VACCINES  Aged Out    Discussed health benefits of physical activity, and encouraged him to engage in regular exercise appropriate for his age and condition.  1. Annual physical exam   2. Gastroesophageal reflux disease without esophagitis Patient is most likely experiencing reflux with his throat and no symptoms.  May need GI or ENT referral if this does not help.  Add Carafate to his pantoprazole - sucralfate (CARAFATE) 1 g tablet; Take 1 tablet (1 g total) by mouth 4 (four) times daily -  before meals and at bedtime.  Dispense: 120 tablet; Refill: 0  3. Essential (primary) hypertension Good control - CBC with Differential/Platelet - Comprehensive metabolic panel - Lipid panel - TSH  4. Benign prostatic hyperplasia without urinary obstruction Follow-up PSA in this otherwise healthy 74-year - PSA   No follow-ups on file.     I, Wilhemena Durie, MD, have reviewed all documentation for this visit. The documentation on 04/30/21 for the exam, diagnosis, procedures, and orders are all accurate and  complete.    Tracina Beaumont Cranford Mon, MD  [facility] [phone] (phone) [phone] (fax)  [place]

## 2021-05-04 ENCOUNTER — Emergency Department
Admission: EM | Admit: 2021-05-04 | Discharge: 2021-05-04 | Disposition: A | Discharge status: Home / Self Care | Payer: PPO | Attending: Emergency Medicine | Admitting: Emergency Medicine

## 2021-05-04 ENCOUNTER — Emergency Department: Payer: PPO

## 2021-05-04 ENCOUNTER — Other Ambulatory Visit: Payer: Self-pay

## 2021-05-04 ENCOUNTER — Encounter: Payer: Self-pay | Admitting: Emergency Medicine

## 2021-05-04 DIAGNOSIS — Z955 Presence of coronary angioplasty implant and graft: Secondary | ICD-10-CM | POA: Insufficient documentation

## 2021-05-04 DIAGNOSIS — R531 Weakness: Secondary | ICD-10-CM | POA: Insufficient documentation

## 2021-05-04 DIAGNOSIS — I10 Essential (primary) hypertension: Secondary | ICD-10-CM | POA: Diagnosis not present

## 2021-05-04 DIAGNOSIS — Z79899 Other long term (current) drug therapy: Secondary | ICD-10-CM | POA: Insufficient documentation

## 2021-05-04 DIAGNOSIS — Z87891 Personal history of nicotine dependence: Secondary | ICD-10-CM | POA: Diagnosis not present

## 2021-05-04 DIAGNOSIS — I2510 Atherosclerotic heart disease of native coronary artery without angina pectoris: Secondary | ICD-10-CM | POA: Insufficient documentation

## 2021-05-04 DIAGNOSIS — R42 Dizziness and giddiness: Secondary | ICD-10-CM | POA: Diagnosis not present

## 2021-05-04 DIAGNOSIS — Z7982 Long term (current) use of aspirin: Secondary | ICD-10-CM | POA: Insufficient documentation

## 2021-05-04 DIAGNOSIS — Z85820 Personal history of malignant melanoma of skin: Secondary | ICD-10-CM | POA: Insufficient documentation

## 2021-05-04 DIAGNOSIS — R001 Bradycardia, unspecified: Secondary | ICD-10-CM | POA: Diagnosis not present

## 2021-05-04 LAB — CBC
HCT: 44.4 % (ref 39.0–52.0)
Hemoglobin: 15.1 g/dL (ref 13.0–17.0)
MCH: 31.7 pg (ref 26.0–34.0)
MCHC: 34 g/dL (ref 30.0–36.0)
MCV: 93.1 fL (ref 80.0–100.0)
Platelets: 208 10*3/uL (ref 150–400)
RBC: 4.77 MIL/uL (ref 4.22–5.81)
RDW: 13.8 % (ref 11.5–15.5)
WBC: 5 10*3/uL (ref 4.0–10.5)
nRBC: 0 % (ref 0.0–0.2)

## 2021-05-04 LAB — BASIC METABOLIC PANEL
Anion gap: 5 (ref 5–15)
BUN: 14 mg/dL (ref 8–23)
CO2: 33 mmol/L — ABNORMAL HIGH (ref 22–32)
Calcium: 9.5 mg/dL (ref 8.9–10.3)
Chloride: 104 mmol/L (ref 98–111)
Creatinine, Ser: 1.03 mg/dL (ref 0.61–1.24)
GFR, Estimated: >60 mL/min (ref >60)
Glucose, Bld: 110 mg/dL — ABNORMAL HIGH (ref 70–99)
Potassium: 4.4 mmol/L (ref 3.5–5.1)
Sodium: 142 mmol/L (ref 135–145)

## 2021-05-04 NOTE — ED Triage Notes (Signed)
Pt comes into the ED via POV c/o dizziness, weakness, and change in balance.  PT states LKW was today 12:30.  Symptoms started at 12:50.  PT does have a h/o TIA's in the past.  Pt states he is no longer dizzy but is still having weakness. PT ambulatory to triage with good gait.  No unilateral weakness present and patient neurologically intact with an NIH of 0.  Pt does also have a cardiac history.

## 2021-05-04 NOTE — ED Provider Notes (Signed)
[facility] Emergency Department Provider Note   ____________________________________________    I have reviewed the triage vital signs and the nursing notes.   HISTORY  Chief Complaint Dizziness and Weakness     HPI Timothy Singleton is a 75 y.o. male who presents with complaints of dizziness and weakness.  Patient reports that around 1230 he felt acutely dizzy.  He reports it was difficult to stand because he felt so dizzy.  He also felt diffusely weak as well.  He denies any chest pain or palpitations.  No extremity weakness.  No change in vision.  No tinnitus.  He is concerned because he reports he has had a TIA in the past.  No nausea or vomiting.  Currently feels at baseline and has no symptoms.  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   Carotid stenosis 07/22/2018   Hip pain, bilateral 05/25/2017   Localized, primary osteoarthritis of shoulder region 09/10/2015   Clinical depression 07/22/2015   Acid reflux 07/22/2015   Irritable bowel syndrome without diarrhea 07/22/2015   Actinic keratosis 07/22/2015   TIA (transient ischemic attack) 07/22/2015   Obstructive sleep apnea 07/22/2015   Idiopathic trigeminal neuralgia 07/22/2015   Gastro-esophageal reflux disease without esophagitis 07/22/2015   Allergic rhinitis 07/22/2015   Major depressive disorder, single episode, unspecified 07/22/2015   Bilateral carotid artery stenosis 02/14/2015   Arteriosclerosis of coronary artery 10/16/2014   CAD in native artery 10/16/2014   Atherosclerotic heart disease of native  coronary artery without angina pectoris 10/16/2014   Essential (primary) hypertension 08/21/2014   Combined fat and carbohydrate induced hyperlipemia 08/21/2014   Heart attack ([place]) 08/13/2014   Benign prostatic hyperplasia without lower urinary tract symptoms 51/88/4166   Renal colic 04/17/1600    Past Surgical History:  Procedure Laterality Date   BUNIONECTOMY     COLONOSCOPY WITH PROPOFOL N/A 11/17/2016   Procedure: COLONOSCOPY WITH PROPOFOL;  Surgeon: Jonathon Bellows, MD;  Location: [facility] ENDOSCOPY;  Service: Endoscopy;  Laterality: N/A;   CORONARY STENT PLACEMENT     EXTRACORPOREAL SHOCK WAVE LITHOTRIPSY     EXTRACORPOREAL SHOCK WAVE LITHOTRIPSY Right 12/30/2017   Procedure: EXTRACORPOREAL SHOCK WAVE LITHOTRIPSY (ESWL);  Surgeon: Hollice Espy, MD;  Location: [facility] ORS;  Service: Urology;  Laterality: Right;   EXTRACORPOREAL SHOCK WAVE LITHOTRIPSY Right 06/20/2020   Procedure: EXTRACORPOREAL SHOCK WAVE LITHOTRIPSY (ESWL);  Surgeon: Hollice Espy, MD;  Location: [facility] ORS;  Service: Urology;  Laterality: Right;   HERNIA REPAIR     inguinal-right   JOINT REPLACEMENT     total shoulder replacement   KNEE SURGERY Right    TONSILLECTOMY     TOTAL SHOULDER REPLACEMENT     UPPER GI ENDOSCOPY  10/18/01   hiatus hernia   VASECTOMY     WRIST SURGERY      Prior to Admission medications   Medication Sig Start Date End Date Taking? Authorizing Provider  aspirin 81 MG tablet Take 81 mg by mouth daily.    [provider]  atorvastatin (LIPITOR) 80 MG  tablet atorvastatin 80 mg tablet  TAKE 1 TABLET BY MOUTH EVERY DAY    [provider]  carbamazepine (CARBATROL) 200 MG 12 hr capsule TAKE 1 CAPSULE BY MOUTH TWICE A DAY 08/30/20   Jerrol Banana., MD  Cholecalciferol 50 MCG (2000 UT) CHEW     [provider]  lisinopril (ZESTRIL) 20 MG tablet Take 1 tablet by mouth daily. 05/31/20   [provider]  Omega-3 Fatty Acids (FISH OIL) 1000 MG CAPS Take 1  capsule by mouth daily at 6 (six) AM. Unknown dose    [provider]  pantoprazole (PROTONIX) 40 MG tablet TAKE 1 TABLET BY MOUTH EVERY DAY 03/19/21   Jerrol Banana., MD  sertraline (ZOLOFT) 100 MG tablet Take 1 tablet (100 mg total) by mouth daily. 03/04/21   Jerrol Banana., MD  sucralfate (CARAFATE) 1 g tablet Take 1 tablet (1 g total) by mouth 4 (four) times daily -  before meals and at bedtime. 04/24/21   Jerrol Banana., MD  tamsulosin (FLOMAX) 0.4 MG CAPS capsule TAKE 1 CAPSULE BY MOUTH EVERY DAY 01/03/21   Jerrol Banana., MD     Allergies Patient has no known allergies.  Family History  Problem Relation Age of Onset   Cancer Mother    Dementia Mother    Stroke Father    Heart disease Father    Hypertension Father    Breast cancer Sister    Parkinson's disease Brother    Hypertension Brother    Melanoma Maternal Grandmother    Bladder Cancer Neg Hx    Prostate cancer Neg Hx    Kidney cancer Neg Hx     Social History Social History   Tobacco Use   Smoking status: Former   Smokeless tobacco: Former   Tobacco comments:    quit 40 years ago   Substance Use Topics   Alcohol use: Yes    Alcohol/week: 0.0 standard drinks    Comment: 2-4 xs per month / beer or mixed drink   Drug use: No    Review of Systems  Constitutional: No fever/chills Eyes: No visual changes.  ENT: No sore throat. Cardiovascular: Denies chest pain. Respiratory: Denies shortness of breath. Gastrointestinal: No abdominal pain.  No nausea, no vomiting.   Genitourinary: Negative for dysuria. Musculoskeletal: Negative for back pain. Skin: Negative for rash. Neurological: As above   ____________________________________________   PHYSICAL EXAM:  VITAL SIGNS: ED Triage Vitals [05/04/21 1338]  Enc Vitals Group     BP (!) 144/69     Pulse Rate (!) 56     Resp 17     Temp 98.1 F (36.7 C)     Temp Source Oral     SpO2 95 %     Weight 80.7 kg (177 lb 14.6  oz)     Height 1.803 m (5\' 11" )     Head Circumference      Peak Flow      Pain Score 0     Pain Loc      Pain Edu?      Excl. in [place]?     Constitutional: Alert and oriented. No acute distress. Pleasant and interactive Eyes: Conjunctivae are normal.  PERRLA, EOMI, 3 beats leftward nystagmus Head: Atraumatic. Nose: No congestion/rhinnorhea. Mouth/Throat: Mucous membranes are moist.    Cardiovascular: Normal rate, regular rhythm. Grossly normal heart sounds.  Good peripheral circulation. Respiratory: Normal respiratory effort.  No retractions. Lungs CTAB. Gastrointestinal:  Soft and nontender. No distention.  No CVA tenderness. Genitourinary: deferred Musculoskeletal: No lower extremity tenderness nor edema.  Warm and well perfused Neurologic:  Normal speech and language. No gross focal neurologic deficits are appreciated.  Cranial nerves II through XII are normal, no dysdiadochokinesis, no issues with heel-to-shin Skin:  Skin is warm, dry and intact. No rash noted. Psychiatric: Mood and affect are normal. Speech and behavior are normal.  ____________________________________________   LABS (all labs ordered are listed, but only abnormal results are displayed)  Labs Reviewed  BASIC METABOLIC PANEL - Abnormal; Notable for the following components:      Result Value   CO2 33 (*)    Glucose, Bld 110 (*)    All other components within normal limits  CBC  URINALYSIS, COMPLETE (UACMP) WITH MICROSCOPIC   ____________________________________________  EKG  ED ECG REPORT I, Lavonia Drafts, the attending physician, personally viewed and interpreted this ECG.  Date: 05/04/2021  Rhythm: Sinus bradycardia QRS Axis: normal Intervals: normal ST/T Wave abnormalities: No acute changes Narrative Interpretation: no evidence of acute ischemia  ____________________________________________  RADIOLOGY  MRI brain ____________________________________________   PROCEDURES  Procedure(s)  performed: No  Procedures   Critical Care performed: No ____________________________________________   INITIAL IMPRESSION / ASSESSMENT AND PLAN / ED COURSE  Pertinent labs & imaging results that were available during my care of the patient were reviewed by me and considered in my medical decision making (see chart for details).   Patient asymptomatic upon my evaluation, symptoms resolved within 1 hour.  Differential includes peripheral vertigo, less likely TIA/stroke  Lab work today is reassuring, no cardiac symptoms.  Will obtain MRI  MRI is normal, patient remains asymptomatic, appropriate for discharge with outpatient follow-up, return precautions discussed    ____________________________________________   FINAL CLINICAL IMPRESSION(S) / ED DIAGNOSES  Final diagnoses:  Vertigo        Note:  This document was prepared using Dragon voice recognition software and may include unintentional dictation errors.    Lavonia Drafts, MD 05/04/21 2114

## 2021-05-22 ENCOUNTER — Other Ambulatory Visit: Payer: Self-pay | Admitting: Family Medicine

## 2021-05-22 DIAGNOSIS — K219 Gastro-esophageal reflux disease without esophagitis: Secondary | ICD-10-CM

## 2021-05-22 NOTE — Telephone Encounter (Signed)
Requested Prescriptions  Pending Prescriptions Disp Refills  . sucralfate (CARAFATE) 1 g tablet [Pharmacy Med Name: SUCRALFATE 1 GM TABLET] 120 tablet 2    Sig: TAKE 1 TABLET (1 G TOTAL) BY MOUTH 4 (FOUR) TIMES DAILY - BEFORE MEALS AND AT BEDTIME.     Gastroenterology: Antiacids Passed - 05/22/2021  1:41 AM      Passed - Valid encounter within last 12 months    Recent Outpatient Visits          4 weeks ago Annual physical exam   [facility] Jerrol Banana., MD   10 months ago Need for immunization against influenza   [facility] Jerrol Banana., MD   1 year ago Arteriosclerosis of coronary artery   [facility] Jerrol Banana., MD   1 year ago Annual physical exam   [facility] Jerrol Banana., MD   2 years ago Depression, unspecified depression type   [facility] Jerrol Banana., MD

## 2021-05-26 DIAGNOSIS — D485 Neoplasm of uncertain behavior of skin: Secondary | ICD-10-CM | POA: Diagnosis not present

## 2021-05-26 DIAGNOSIS — D0472 Carcinoma in situ of skin of left lower limb, including hip: Secondary | ICD-10-CM | POA: Diagnosis not present

## 2021-05-26 DIAGNOSIS — C44729 Squamous cell carcinoma of skin of left lower limb, including hip: Secondary | ICD-10-CM | POA: Diagnosis not present

## 2021-06-05 DIAGNOSIS — C44729 Squamous cell carcinoma of skin of left lower limb, including hip: Secondary | ICD-10-CM | POA: Diagnosis not present

## 2021-06-10 ENCOUNTER — Ambulatory Visit (INDEPENDENT_AMBULATORY_CARE_PROVIDER_SITE_OTHER): Payer: PPO | Admitting: Vascular Surgery

## 2021-06-10 ENCOUNTER — Encounter (INDEPENDENT_AMBULATORY_CARE_PROVIDER_SITE_OTHER): Payer: PPO

## 2021-06-17 ENCOUNTER — Ambulatory Visit (INDEPENDENT_AMBULATORY_CARE_PROVIDER_SITE_OTHER): Payer: PPO | Admitting: Vascular Surgery

## 2021-06-17 ENCOUNTER — Encounter (INDEPENDENT_AMBULATORY_CARE_PROVIDER_SITE_OTHER): Payer: PPO

## 2021-06-20 ENCOUNTER — Other Ambulatory Visit: Payer: Self-pay | Admitting: Family Medicine

## 2021-06-20 DIAGNOSIS — K219 Gastro-esophageal reflux disease without esophagitis: Secondary | ICD-10-CM

## 2021-06-20 NOTE — Telephone Encounter (Signed)
Valid encounter. No future visit

## 2021-06-25 ENCOUNTER — Other Ambulatory Visit: Payer: Self-pay | Admitting: Family Medicine

## 2021-06-25 DIAGNOSIS — G500 Trigeminal neuralgia: Secondary | ICD-10-CM

## 2021-06-25 NOTE — Telephone Encounter (Signed)
Requested medication (s) are due for refill today: Yes  Requested medication (s) are on the active medication list: Yes  Last refill:  09/08/20  Future visit scheduled: No  Notes to clinic:  See request.    Requested Prescriptions  Pending Prescriptions Disp Refills   carbamazepine (CARBATROL) 200 MG 12 hr capsule [Pharmacy Med Name: CARBAMAZEPINE ER 200 MG CAP] 180 capsule 2    Sig: TAKE 1 CAPSULE BY MOUTH TWICE A DAY     Not Delegated - Neurology:  Anticonvulsants - carbamazepine Failed - 06/25/2021  1:26 AM      Failed - This refill cannot be delegated      Failed - AST in normal range and within 90 days    AST  Date Value Ref Range Status  07/08/2020 20 0 - 40 IU/L Final          Failed - ALT in normal range and within 90 days    ALT  Date Value Ref Range Status  07/08/2020 15 0 - 44 IU/L Final          Failed - Carbamazepine (serum) in normal range and within 360 days    No results found for: CBMZ, LABCARB        Passed - WBC in normal range and within 90 days    WBC  Date Value Ref Range Status  05/04/2021 5.0 4.0 - 10.5 K/uL Final          Passed - PLT in normal range and within 90 days    Platelets  Date Value Ref Range Status  05/04/2021 208 150 - 400 K/uL Final  07/08/2020 220 150 - 450 x10E3/uL Final          Passed - HGB in normal range and within 90 days    Hemoglobin  Date Value Ref Range Status  05/04/2021 15.1 13.0 - 17.0 g/dL Final  07/08/2020 13.2 13.0 - 17.7 g/dL Final          Passed - Na in normal range and within 90 days    Sodium  Date Value Ref Range Status  05/04/2021 142 135 - 145 mmol/L Final  07/08/2020 142 134 - 144 mmol/L Final          Passed - HCT in normal range and within 90 days    HCT  Date Value Ref Range Status  05/04/2021 44.4 39.0 - 52.0 % Final   Hematocrit  Date Value Ref Range Status  07/08/2020 39.9 37.5 - 51.0 % Final          Passed - Valid encounter within last 12 months    Recent Outpatient  Visits           2 months ago Annual physical exam   [facility] Jerrol Banana., MD   11 months ago Need for immunization against influenza   [facility] Jerrol Banana., MD   1 year ago Arteriosclerosis of coronary artery   [facility] Jerrol Banana., MD   1 year ago Annual physical exam   [facility] Jerrol Banana., MD   2 years ago Depression, unspecified depression type   [facility] Jerrol Banana., MD              Signed Prescriptions Disp Refills   tamsulosin (FLOMAX) 0.4 MG CAPS capsule 90 capsule 0    Sig: TAKE 1 CAPSULE BY MOUTH EVERY DAY  Urology: Alpha-Adrenergic Blocker Passed - 06/25/2021  1:26 AM      Passed - Last BP in normal range    BP Readings from Last 1 Encounters:  05/04/21 138/64          Passed - Valid encounter within last 12 months    Recent Outpatient Visits           2 months ago Annual physical exam   [facility] Jerrol Banana., MD   11 months ago Need for immunization against influenza   [facility] Jerrol Banana., MD   1 year ago Arteriosclerosis of coronary artery   [facility] Jerrol Banana., MD   1 year ago Annual physical exam   [facility] Jerrol Banana., MD   2 years ago Depression, unspecified depression type   [facility] Jerrol Banana., MD

## 2021-06-25 NOTE — Telephone Encounter (Signed)
LOV:  10/04/2021 (to discuss trigeminal neuralgia) NOV: none   Last Refill: 08/30/2020 #180 2 Refills.

## 2021-07-01 ENCOUNTER — Other Ambulatory Visit (INDEPENDENT_AMBULATORY_CARE_PROVIDER_SITE_OTHER): Payer: Self-pay | Admitting: Nurse Practitioner

## 2021-07-01 DIAGNOSIS — D0462 Carcinoma in situ of skin of left upper limb, including shoulder: Secondary | ICD-10-CM | POA: Diagnosis not present

## 2021-07-01 DIAGNOSIS — L821 Other seborrheic keratosis: Secondary | ICD-10-CM | POA: Diagnosis not present

## 2021-07-01 DIAGNOSIS — Z85828 Personal history of other malignant neoplasm of skin: Secondary | ICD-10-CM | POA: Diagnosis not present

## 2021-07-01 DIAGNOSIS — X32XXXA Exposure to sunlight, initial encounter: Secondary | ICD-10-CM | POA: Diagnosis not present

## 2021-07-01 DIAGNOSIS — I6523 Occlusion and stenosis of bilateral carotid arteries: Secondary | ICD-10-CM

## 2021-07-01 DIAGNOSIS — Z85820 Personal history of malignant melanoma of skin: Secondary | ICD-10-CM | POA: Diagnosis not present

## 2021-07-01 DIAGNOSIS — D2272 Melanocytic nevi of left lower limb, including hip: Secondary | ICD-10-CM | POA: Diagnosis not present

## 2021-07-01 DIAGNOSIS — Z86006 Personal history of melanoma in-situ: Secondary | ICD-10-CM | POA: Diagnosis not present

## 2021-07-01 DIAGNOSIS — L570 Actinic keratosis: Secondary | ICD-10-CM | POA: Diagnosis not present

## 2021-07-01 DIAGNOSIS — D485 Neoplasm of uncertain behavior of skin: Secondary | ICD-10-CM | POA: Diagnosis not present

## 2021-07-02 DIAGNOSIS — H2513 Age-related nuclear cataract, bilateral: Secondary | ICD-10-CM | POA: Diagnosis not present

## 2021-07-04 ENCOUNTER — Ambulatory Visit (INDEPENDENT_AMBULATORY_CARE_PROVIDER_SITE_OTHER): Payer: PPO | Admitting: Vascular Surgery

## 2021-07-04 ENCOUNTER — Other Ambulatory Visit: Payer: Self-pay

## 2021-07-04 ENCOUNTER — Ambulatory Visit (INDEPENDENT_AMBULATORY_CARE_PROVIDER_SITE_OTHER): Payer: PPO

## 2021-07-04 VITALS — BP 135/74 | HR 55 | Ht 71.0 in | Wt 179.0 lb

## 2021-07-04 DIAGNOSIS — I2510 Atherosclerotic heart disease of native coronary artery without angina pectoris: Secondary | ICD-10-CM | POA: Diagnosis not present

## 2021-07-04 DIAGNOSIS — I6523 Occlusion and stenosis of bilateral carotid arteries: Secondary | ICD-10-CM | POA: Diagnosis not present

## 2021-07-04 DIAGNOSIS — I10 Essential (primary) hypertension: Secondary | ICD-10-CM | POA: Diagnosis not present

## 2021-07-04 DIAGNOSIS — E782 Mixed hyperlipidemia: Secondary | ICD-10-CM | POA: Diagnosis not present

## 2021-07-04 NOTE — Assessment & Plan Note (Signed)
Duplex today shows velocities in the internal carotid artery in the 1 to 39% range but both common carotid arteries have velocities that would indicate a greater than 50% stenosis.  These velocities have not increased and he has previously had a CT scan showing a 60% common carotid artery stenosis.  This appears roughly stable.  I would not recommend any surgery or intervention at this time.  We will continue 50-monthfollow-up intervals with duplex.  Continue current medical regimen.

## 2021-07-04 NOTE — Progress Notes (Signed)
MRN : [id number]  Timothy Singleton is a 75 y.o. ([birth year]) male who presents with chief complaint of  Chief Complaint  Patient presents with   Follow-up    6 mo Carotid   .  History of Present Illness: Patient returns today in follow up of his carotid disease. He is doing well.  No complaints. No focal neurologic issues. Specifically, the patient denies amaurosis fugax, speech or swallowing difficulties, or arm or leg weakness or numbness.  Duplex today shows velocities in the internal carotid artery in the 1 to 39% range but both common carotid arteries have velocities that would indicate a greater than 50% stenosis.  These velocities have not increased and he has previously had a CT scan showing a 60% common carotid artery stenosis.  This appears roughly stable.  Current Outpatient Medications  Medication Sig Dispense Refill   anastrozole (ARIMIDEX) 1 MG tablet Take 1 mg by mouth every 14 (fourteen) days.     aspirin 81 MG tablet Take 81 mg by mouth daily.     atorvastatin (LIPITOR) 80 MG tablet atorvastatin 80 mg tablet  TAKE 1 TABLET BY MOUTH EVERY DAY     carbamazepine (CARBATROL) 200 MG 12 hr capsule TAKE 1 CAPSULE BY MOUTH TWICE A DAY 180 capsule 1   Cholecalciferol 50 MCG (2000 UT) CHEW      lisinopril (ZESTRIL) 20 MG tablet Take 1 tablet by mouth daily.     Omega-3 Fatty Acids (FISH OIL) 1000 MG CAPS Take 1 capsule by mouth daily at 6 (six) AM. Unknown dose     pantoprazole (PROTONIX) 40 MG tablet TAKE 1 TABLET BY MOUTH EVERY DAY 90 tablet 0   sertraline (ZOLOFT) 100 MG tablet Take 1 tablet (100 mg total) by mouth daily. 90 tablet 1   sucralfate (CARAFATE) 1 g tablet TAKE 1 TABLET (1 G TOTAL) BY MOUTH 4 (FOUR) TIMES DAILY - BEFORE MEALS AND AT BEDTIME. 120 tablet 2   tamsulosin (FLOMAX) 0.4 MG CAPS capsule TAKE 1 CAPSULE BY MOUTH EVERY DAY 90 capsule 0   No current facility-administered medications for this visit.    Past Medical History:  Diagnosis Date   Arthritis     Cancer ([place])    melanoma / knee   Depression    GERD (gastroesophageal reflux disease)    Hyperlipemia    Hypertension    Past heart attack    Sleep apnea     Past Surgical History:  Procedure Laterality Date   BUNIONECTOMY     COLONOSCOPY WITH PROPOFOL N/A 11/17/2016   Procedure: COLONOSCOPY WITH PROPOFOL;  Surgeon: Jonathon Bellows, MD;  Location: [facility] ENDOSCOPY;  Service: Endoscopy;  Laterality: N/A;   CORONARY STENT PLACEMENT     EXTRACORPOREAL SHOCK WAVE LITHOTRIPSY     EXTRACORPOREAL SHOCK WAVE LITHOTRIPSY Right 12/30/2017   Procedure: EXTRACORPOREAL SHOCK WAVE LITHOTRIPSY (ESWL);  Surgeon: Hollice Espy, MD;  Location: [facility] ORS;  Service: Urology;  Laterality: Right;   EXTRACORPOREAL SHOCK WAVE LITHOTRIPSY Right 06/20/2020   Procedure: EXTRACORPOREAL SHOCK WAVE LITHOTRIPSY (ESWL);  Surgeon: Hollice Espy, MD;  Location: [facility] ORS;  Service: Urology;  Laterality: Right;   HERNIA REPAIR     inguinal-right   JOINT REPLACEMENT     total shoulder replacement   KNEE SURGERY Right    TONSILLECTOMY     TOTAL SHOULDER REPLACEMENT     UPPER GI ENDOSCOPY  10/18/01   hiatus hernia   VASECTOMY     WRIST SURGERY  Social History   Tobacco Use   Smoking status: Former   Smokeless tobacco: Former   Tobacco comments:    quit 40 years ago   Substance Use Topics   Alcohol use: Yes    Alcohol/week: 0.0 standard drinks    Comment: 2-4 xs per month / beer or mixed drink   Drug use: No      Family History  Problem Relation Age of Onset   Cancer Mother    Dementia Mother    Stroke Father    Heart disease Father    Hypertension Father    Breast cancer Sister    Parkinson's disease Brother    Hypertension Brother    Melanoma Maternal Grandmother    Bladder Cancer Neg Hx    Prostate cancer Neg Hx    Kidney cancer Neg Hx      No Known Allergies   REVIEW OF SYSTEMS (Negative unless checked)   Constitutional: '[]'$ Weight loss  '[]'$ Fever  '[]'$ Chills Cardiac: '[]'$ Chest pain    '[]'$ Chest pressure   '[]'$ Palpitations   '[]'$ Shortness of breath when laying flat   '[]'$ Shortness of breath at rest   '[x]'$ Shortness of breath with exertion. Vascular:  '[]'$ Pain in legs with walking   '[]'$ Pain in legs at rest   '[]'$ Pain in legs when laying flat   '[]'$ Claudication   '[]'$ Pain in feet when walking  '[]'$ Pain in feet at rest  '[]'$ Pain in feet when laying flat   '[]'$ History of DVT   '[]'$ Phlebitis   '[]'$ Swelling in legs   '[]'$ Varicose veins   '[]'$ Non-healing ulcers Pulmonary:   '[]'$ Uses home oxygen   '[]'$ Productive cough   '[]'$ Hemoptysis   '[]'$ Wheeze  '[]'$ COPD   '[]'$ Asthma Neurologic:  '[x]'$ Dizziness  '[]'$ Blackouts   '[]'$ Seizures   '[]'$ History of stroke   '[]'$ History of TIA  '[x]'$ Aphasia   '[]'$ Temporary blindness   '[]'$ Dysphagia   '[]'$ Weakness or numbness in arms   '[]'$ Weakness or numbness in legs Musculoskeletal:  '[x]'$ Arthritis   '[]'$ Joint swelling   '[]'$ Joint pain   '[]'$ Low back pain Hematologic:  '[]'$ Easy bruising  '[]'$ Easy bleeding   '[]'$ Hypercoagulable state   '[]'$ Anemic  '[]'$ Hepatitis Gastrointestinal:  '[]'$ Blood in stool   '[]'$ Vomiting blood  '[x]'$ Gastroesophageal reflux/heartburn   '[]'$ Abdominal pain Genitourinary:  '[]'$ Chronic kidney disease   '[]'$ Difficult urination  '[]'$ Frequent urination  '[]'$ Burning with urination   '[]'$ Hematuria Skin:  '[]'$ Rashes   '[]'$ Ulcers   '[]'$ Wounds Psychological:  '[]'$ History of anxiety   '[]'$  History of major depression.  Physical Examination  BP 135/74   Pulse (!) 55   Ht '5\' 11"'$  (1.803 m)   Wt 179 lb (81.2 kg)   BMI 24.97 kg/m  Gen:  WD/WN, NAD.  Appears younger than stated age Head: [place]/AT, No temporalis wasting. Ear/Nose/Throat: Hearing grossly intact, nares w/o erythema or drainage Eyes: Conjunctiva clear. Sclera non-icteric Neck: Supple.  Trachea midline Pulmonary:  Good air movement, no use of accessory muscles.  Cardiac: RRR, no JVD Vascular:  Vessel Right Left  Radial Palpable Palpable       Musculoskeletal: M/S 5/5 throughout.  No deformity or atrophy.  No edema. Neurologic: Sensation grossly intact in extremities.  Symmetrical.  Speech  is fluent.  Psychiatric: Judgment intact, Mood & affect appropriate for pt's clinical situation. Dermatologic: No rashes or ulcers noted.  No cellulitis or open wounds.      Labs Recent Results (from the past 2160 hour(s))  Basic metabolic panel     Status: Abnormal   Collection Time: 05/04/21  1:44 PM  Result Value Ref Range  Sodium 142 135 - 145 mmol/L   Potassium 4.4 3.5 - 5.1 mmol/L   Chloride 104 98 - 111 mmol/L   CO2 33 (H) 22 - 32 mmol/L   Glucose, Bld 110 (H) 70 - 99 mg/dL    Comment: Glucose reference range applies only to samples taken after fasting for at least 8 hours.   BUN 14 8 - 23 mg/dL   Creatinine, Ser 1.03 0.61 - 1.24 mg/dL   Calcium 9.5 8.9 - 10.3 mg/dL   GFR, Estimated >60 >60 mL/min    Comment: (NOTE) Calculated using the CKD-EPI Creatinine Equation (2021)    Anion gap 5 5 - 15    Comment: Performed at [facility], [address] [place], [place] [address]  CBC     Status: None   Collection Time: 05/04/21  1:44 PM  Result Value Ref Range   WBC 5.0 4.0 - 10.5 K/uL   RBC 4.77 4.22 - 5.81 MIL/uL   Hemoglobin 15.1 13.0 - 17.0 g/dL   HCT 44.4 39.0 - 52.0 %   MCV 93.1 80.0 - 100.0 fL   MCH 31.7 26.0 - 34.0 pg   MCHC 34.0 30.0 - 36.0 g/dL   RDW 13.8 11.5 - 15.5 %   Platelets 208 150 - 400 K/uL   nRBC 0.0 0.0 - 0.2 %    Comment: Performed at [facility], [address] [place],  [address]    Radiology No results found.  Assessment/Plan Essential (primary) hypertension blood pressure control important in reducing the progression of atherosclerotic disease. On appropriate oral medications.     Combined fat and carbohydrate induced hyperlipemia lipid control important in reducing the progression of atherosclerotic disease. Continue statin therapy     CAD in native artery If we end up doing surgery, we will ask his cardiologist to assess his surgical risk.  Carotid stenosis Duplex today shows velocities in the  internal carotid artery in the 1 to 39% range but both common carotid arteries have velocities that would indicate a greater than 50% stenosis.  These velocities have not increased and he has previously had a CT scan showing a 60% common carotid artery stenosis.  This appears roughly stable.  I would not recommend any surgery or intervention at this time.  We will continue 19-monthfollow-up intervals with duplex.  Continue current medical regimen.    JLeotis Pain MD  07/04/2021 9:30 AM    This note was created with Dragon medical transcription system.  Any errors from dictation are purely unintentional

## 2021-07-28 DIAGNOSIS — L905 Scar conditions and fibrosis of skin: Secondary | ICD-10-CM | POA: Diagnosis not present

## 2021-07-28 DIAGNOSIS — L570 Actinic keratosis: Secondary | ICD-10-CM | POA: Diagnosis not present

## 2021-07-28 DIAGNOSIS — C44722 Squamous cell carcinoma of skin of right lower limb, including hip: Secondary | ICD-10-CM | POA: Diagnosis not present

## 2021-08-24 ENCOUNTER — Other Ambulatory Visit: Payer: Self-pay | Admitting: Family Medicine

## 2021-08-24 DIAGNOSIS — K219 Gastro-esophageal reflux disease without esophagitis: Secondary | ICD-10-CM

## 2021-08-24 NOTE — Telephone Encounter (Signed)
Requested Prescriptions  Pending Prescriptions Disp Refills  . sucralfate (CARAFATE) 1 g tablet [Pharmacy Med Name: SUCRALFATE 1 GM TABLET] 360 tablet 0    Sig: TAKE 1 TABLET (1 G TOTAL) BY MOUTH 4 (FOUR) TIMES DAILY - BEFORE MEALS AND AT BEDTIME.     Gastroenterology: Antiacids Passed - 08/24/2021 12:57 AM      Passed - Valid encounter within last 12 months    Recent Outpatient Visits          4 months ago Annual physical exam   [facility] Jerrol Banana., MD   1 year ago Need for immunization against influenza   [facility] Jerrol Banana., MD   1 year ago Arteriosclerosis of coronary artery   [facility] Jerrol Banana., MD   1 year ago Annual physical exam   [facility] Jerrol Banana., MD   2 years ago Depression, unspecified depression type   [facility] Jerrol Banana., MD

## 2021-09-07 ENCOUNTER — Other Ambulatory Visit: Payer: Self-pay | Admitting: Family Medicine

## 2021-09-07 DIAGNOSIS — F32A Depression, unspecified: Secondary | ICD-10-CM

## 2021-09-07 NOTE — Telephone Encounter (Signed)
Requested Prescriptions  Pending Prescriptions Disp Refills  . sertraline (ZOLOFT) 100 MG tablet [Pharmacy Med Name: SERTRALINE HCL 100 MG TABLET] 90 tablet 1    Sig: TAKE 1 TABLET BY MOUTH EVERY DAY     Psychiatry:  Antidepressants - SSRI Passed - 09/07/2021  1:46 PM      Passed - Completed PHQ-2 or PHQ-9 in the last 360 days      Passed - Valid encounter within last 6 months    Recent Outpatient Visits          4 months ago Annual physical exam   [facility] Jerrol Banana., MD   1 year ago Need for immunization against influenza   [facility] Jerrol Banana., MD   1 year ago Arteriosclerosis of coronary artery   [facility] Jerrol Banana., MD   1 year ago Annual physical exam   [facility] Jerrol Banana., MD   2 years ago Depression, unspecified depression type   [facility] Jerrol Banana., MD

## 2021-09-22 ENCOUNTER — Other Ambulatory Visit: Payer: Self-pay | Admitting: Family Medicine

## 2021-09-22 DIAGNOSIS — K219 Gastro-esophageal reflux disease without esophagitis: Secondary | ICD-10-CM

## 2021-09-23 NOTE — Telephone Encounter (Signed)
Requested Prescriptions  Pending Prescriptions Disp Refills  . tamsulosin (FLOMAX) 0.4 MG CAPS capsule [Pharmacy Med Name: TAMSULOSIN HCL 0.4 MG CAPSULE] 90 capsule 1    Sig: TAKE 1 CAPSULE BY MOUTH EVERY DAY     Urology: Alpha-Adrenergic Blocker Passed - 09/22/2021  7:53 PM      Passed - Last BP in normal range    BP Readings from Last 1 Encounters:  07/04/21 135/74         Passed - Valid encounter within last 12 months    Recent Outpatient Visits          5 months ago Annual physical exam   [facility] Jerrol Banana., MD   1 year ago Need for immunization against influenza   [facility] Jerrol Banana., MD   1 year ago Arteriosclerosis of coronary artery   [facility] Jerrol Banana., MD   1 year ago Annual physical exam   [facility] Jerrol Banana., MD   2 years ago Depression, unspecified depression type   [facility] Jerrol Banana., MD             . pantoprazole (PROTONIX) 40 MG tablet [Pharmacy Med Name: PANTOPRAZOLE SOD DR 40 MG TAB] 90 tablet 1    Sig: TAKE 1 TABLET BY MOUTH EVERY DAY     Gastroenterology: Proton Pump Inhibitors Passed - 09/22/2021  7:53 PM      Passed - Valid encounter within last 12 months    Recent Outpatient Visits          5 months ago Annual physical exam   [facility] Jerrol Banana., MD   1 year ago Need for immunization against influenza   [facility] Jerrol Banana., MD   1 year ago Arteriosclerosis of coronary artery   [facility] Jerrol Banana., MD   1 year ago Annual physical exam   [facility] Jerrol Banana., MD   2 years ago Depression, unspecified depression type   [facility] Jerrol Banana., MD

## 2021-11-17 ENCOUNTER — Ambulatory Visit (INDEPENDENT_AMBULATORY_CARE_PROVIDER_SITE_OTHER): Payer: PPO

## 2021-11-17 ENCOUNTER — Other Ambulatory Visit: Payer: Self-pay

## 2021-11-17 VITALS — BP 120/60 | HR 54 | Temp 97.9°F | Ht 71.0 in | Wt 175.0 lb

## 2021-11-17 DIAGNOSIS — Z0000 Encounter for general adult medical examination without abnormal findings: Secondary | ICD-10-CM | POA: Diagnosis not present

## 2021-11-17 NOTE — Patient Instructions (Signed)
Timothy Singleton , Thank you for taking time to come for your Medicare Wellness Visit. I appreciate your ongoing commitment to your health goals. Please review the following plan we discussed and let me know if I can assist you in the future.   Screening recommendations/referrals: Colonoscopy: 11/17/16- aged out Recommended yearly ophthalmology/optometry visit for glaucoma screening and checkup Recommended yearly dental visit for hygiene and checkup  Vaccinations: Influenza vaccine: n/d Pneumococcal vaccine: 09/30/16 Tdap vaccine: 01/04/19 Shingles vaccine: Zoster 01/28/10   Covid-19: 11/29/19, 12/20/19  Advanced directives: yes, requested copy   Conditions/risks identified: none  Next appointment: Follow up in one year for your annual wellness visit.   Preventive Care 77 Years and Older, Male Preventive care refers to lifestyle choices and visits with your health care provider that can promote health and wellness. What does preventive care include? A yearly physical exam. This is also called an annual well check. Dental exams once or twice a year. Routine eye exams. Ask your health care provider how often you should have your eyes checked. Personal lifestyle choices, including: Daily care of your teeth and gums. Regular physical activity. Eating a healthy diet. Avoiding tobacco and drug use. Limiting alcohol use. Practicing safe sex. Taking low doses of aspirin every day. Taking vitamin and mineral supplements as recommended by your health care provider. What happens during an annual well check? The services and screenings done by your health care provider during your annual well check will depend on your age, overall health, lifestyle risk factors, and family history of disease. Counseling  Your health care provider may ask you questions about your: Alcohol use. Tobacco use. Drug use. Emotional well-being. Home and relationship well-being. Sexual activity. Eating  habits. History of falls. Memory and ability to understand (cognition). Work and work Statistician. Screening  You may have the following tests or measurements: Height, weight, and BMI. Blood pressure. Lipid and cholesterol levels. These may be checked every 5 years, or more frequently if you are over 43 years old. Skin check. Lung cancer screening. You may have this screening every year starting at age 21 if you have a 30-pack-year history of smoking and currently smoke or have quit within the past 15 years. Fecal occult blood test (FOBT) of the stool. You may have this test every year starting at age 48. Flexible sigmoidoscopy or colonoscopy. You may have a sigmoidoscopy every 5 years or a colonoscopy every 10 years starting at age 23. Prostate cancer screening. Recommendations will vary depending on your family history and other risks. Hepatitis C blood test. Hepatitis B blood test. Sexually transmitted disease (STD) testing. Diabetes screening. This is done by checking your blood sugar (glucose) after you have not eaten for a while (fasting). You may have this done every 1-3 years. Abdominal aortic aneurysm (AAA) screening. You may need this if you are a current or former smoker. Osteoporosis. You may be screened starting at age 10 if you are at high risk. Talk with your health care provider about your test results, treatment options, and if necessary, the need for more tests. Vaccines  Your health care provider may recommend certain vaccines, such as: Influenza vaccine. This is recommended every year. Tetanus, diphtheria, and acellular pertussis (Tdap, Td) vaccine. You may need a Td booster every 10 years. Zoster vaccine. You may need this after age 70. Pneumococcal 13-valent conjugate (PCV13) vaccine. One dose is recommended after age 67. Pneumococcal polysaccharide (PPSV23) vaccine. One dose is recommended after age 28. Talk to your health care  provider about which screenings and  vaccines you need and how often you need them. This information is not intended to replace advice given to you by your health care provider. Make sure you discuss any questions you have with your health care provider. Document Released: 11/01/2015 Document Revised: 06/24/2016 Document Reviewed: 08/06/2015 Elsevier Interactive Patient Education  2017 [employer] Prevention in the Home Falls can cause injuries. They can happen to people of all ages. There are many things you can do to make your home safe and to help prevent falls. What can I do on the outside of my home? Regularly fix the edges of walkways and driveways and fix any cracks. Remove anything that might make you trip as you walk through a door, such as a raised step or threshold. Trim any bushes or trees on the path to your home. Use bright outdoor lighting. Clear any walking paths of anything that might make someone trip, such as rocks or tools. Regularly check to see if handrails are loose or broken. Make sure that both sides of any steps have handrails. Any raised decks and porches should have guardrails on the edges. Have any leaves, snow, or ice cleared regularly. Use sand or salt on walking paths during winter. Clean up any spills in your garage right away. This includes oil or grease spills. What can I do in the bathroom? Use night lights. Install grab bars by the toilet and in the tub and shower. Do not use towel bars as grab bars. Use non-skid mats or decals in the tub or shower. If you need to sit down in the shower, use a plastic, non-slip stool. Keep the floor dry. Clean up any water that spills on the floor as soon as it happens. Remove soap buildup in the tub or shower regularly. Attach bath mats securely with double-sided non-slip rug tape. Do not have throw rugs and other things on the floor that can make you trip. What can I do in the bedroom? Use night lights. Make sure that you have a light by your  bed that is easy to reach. Do not use any sheets or blankets that are too big for your bed. They should not hang down onto the floor. Have a firm chair that has side arms. You can use this for support while you get dressed. Do not have throw rugs and other things on the floor that can make you trip. What can I do in the kitchen? Clean up any spills right away. Avoid walking on wet floors. Keep items that you use a lot in easy-to-reach places. If you need to reach something above you, use a strong step stool that has a grab bar. Keep electrical cords out of the way. Do not use floor polish or wax that makes floors slippery. If you must use wax, use non-skid floor wax. Do not have throw rugs and other things on the floor that can make you trip. What can I do with my stairs? Do not leave any items on the stairs. Make sure that there are handrails on both sides of the stairs and use them. Fix handrails that are broken or loose. Make sure that handrails are as long as the stairways. Check any carpeting to make sure that it is firmly attached to the stairs. Fix any carpet that is loose or worn. Avoid having throw rugs at the top or bottom of the stairs. If you do have throw rugs, attach them to the  floor with carpet tape. Make sure that you have a light switch at the top of the stairs and the bottom of the stairs. If you do not have them, ask someone to add them for you. What else can I do to help prevent falls? Wear shoes that: Do not have high heels. Have rubber bottoms. Are comfortable and fit you well. Are closed at the toe. Do not wear sandals. If you use a stepladder: Make sure that it is fully opened. Do not climb a closed stepladder. Make sure that both sides of the stepladder are locked into place. Ask someone to hold it for you, if possible. Clearly mark and make sure that you can see: Any grab bars or handrails. First and last steps. Where the edge of each step is. Use tools that  help you move around (mobility aids) if they are needed. These include: Canes. Walkers. Scooters. Crutches. Turn on the lights when you go into a dark area. Replace any light bulbs as soon as they burn out. Set up your furniture so you have a clear path. Avoid moving your furniture around. If any of your floors are uneven, fix them. If there are any pets around you, be aware of where they are. Review your medicines with your doctor. Some medicines can make you feel dizzy. This can increase your chance of falling. Ask your doctor what other things that you can do to help prevent falls. This information is not intended to replace advice given to you by your health care provider. Make sure you discuss any questions you have with your health care provider. Document Released: 08/01/2009 Document Revised: 03/12/2016 Document Reviewed: 11/09/2014 Elsevier Interactive Patient Education  2017 [employer].

## 2021-11-17 NOTE — Progress Notes (Signed)
Subjective:   Timothy Singleton is a 76 y.o. male who presents for Medicare Annual/Subsequent preventive examination.  Review of Systems           Objective:    Today's Vitals   11/17/21 0855  BP: 120/60  Pulse: (!) 54  Temp: 97.9 F (36.6 C)  TempSrc: Oral  SpO2: 99%  Weight: 175 lb (79.4 kg)  Height: 5\' 11"  (1.803 m)   Body mass index is 24.41 kg/m.  Advanced Directives 05/04/2021 11/12/2020 06/20/2020 11/17/2016 11/13/2016 05/25/2016 03/11/2016  Does Patient Have a Medical Advance Directive? Yes Yes Yes Yes Yes Yes No  Type of Paramedic of [place];Living will [place];Living will Healthcare Power of [place] Living will [place];Living will [place];Living will -  Copy of [place] in Chart? - No - copy requested - - Yes - -  Would patient like information on creating a medical advance directive? - - - - - - No - patient declined information    Current Medications (verified) Outpatient Encounter Medications as of 11/17/2021  Medication Sig   anastrozole (ARIMIDEX) 1 MG tablet Take 1 mg by mouth every 14 (fourteen) days.   aspirin 81 MG tablet Take 81 mg by mouth daily.   atorvastatin (LIPITOR) 80 MG tablet atorvastatin 80 mg tablet  TAKE 1 TABLET BY MOUTH EVERY DAY   carbamazepine (CARBATROL) 200 MG 12 hr capsule TAKE 1 CAPSULE BY MOUTH TWICE A DAY   Cholecalciferol 50 MCG (2000 UT) CHEW    lisinopril (ZESTRIL) 20 MG tablet Take 1 tablet by mouth daily.   Omega-3 Fatty Acids (FISH OIL) 1000 MG CAPS Take 1 capsule by mouth daily at 6 (six) AM. Unknown dose   pantoprazole (PROTONIX) 40 MG tablet TAKE 1 TABLET BY MOUTH EVERY DAY   sertraline (ZOLOFT) 100 MG tablet TAKE 1 TABLET BY MOUTH EVERY DAY   sucralfate (CARAFATE) 1 g tablet TAKE 1 TABLET (1 G TOTAL) BY MOUTH 4 (FOUR) TIMES DAILY - BEFORE MEALS AND AT BEDTIME.   tamsulosin (FLOMAX) 0.4 MG CAPS capsule TAKE 1  CAPSULE BY MOUTH EVERY DAY   No facility-administered encounter medications on file as of 11/17/2021.    Allergies (verified) Patient has no known allergies.   History: Past Medical History:  Diagnosis Date   Arthritis    Cancer ([place])    melanoma / knee   Depression    GERD (gastroesophageal reflux disease)    Hyperlipemia    Hypertension    Past heart attack    Sleep apnea    Past Surgical History:  Procedure Laterality Date   BUNIONECTOMY     COLONOSCOPY WITH PROPOFOL N/A 11/17/2016   Procedure: COLONOSCOPY WITH PROPOFOL;  Surgeon: Jonathon Bellows, MD;  Location: [facility] END[facility]COPY;  Service: Endoscopy;  Laterality: N/A;   CORONARY STENT PLACEMENT     EXTRACORPOREAL SHOCK WAVE LITHOTRIPSY     EXTRACORPOREAL SHOCK WAVE LITHOTRIPSY Right 12/30/2017   Procedure: EXTRACORPOREAL SHOCK WAVE LITHOTRIPSY (ESWL);  Surgeon: Hollice Espy, MD;  Location: [facility] ORS;  Service: Urology;  Laterality: Right;   EXTRACORPOREAL SHOCK WAVE LITHOTRIPSY Right 06/20/2020   Procedure: EXTRACORPOREAL SHOCK WAVE LITHOTRIPSY (ESWL);  Surgeon: Hollice Espy, MD;  Location: [facility] ORS;  Service: Urology;  Laterality: Right;   HERNIA REPAIR     inguinal-right   JOINT REPLACEMENT     total shoulder replacement   KNEE SURGERY Right    TONSILLECTOMY     TOTAL SHOULDER  REPLACEMENT     UPPER GI ENDOSCOPY  10/18/01   hiatus hernia   VASECTOMY     WRIST SURGERY     Family History  Problem Relation Age of Onset   Cancer Mother    Dementia Mother    Stroke Father    Heart disease Father    Hypertension Father    Breast cancer Sister    Parkinson's disease Brother    Hypertension Brother    Melanoma Maternal Grandmother    Bladder Cancer Neg Hx    Prostate cancer Neg Hx    Kidney cancer Neg Hx    Social History   Socioeconomic History   Marital status: Married    Spouse name: Not on file   Number of children: 2   Years of education: Not on file   Highest education level: Bachelor's degree (e.g., BA,  AB, BS)  Occupational History   Occupation: retired  Tobacco Use   Smoking status: Former   Smokeless tobacco: Former   Tobacco comments:    quit 40 years ago   Substance and Sexual Activity   Alcohol use: Yes    Alcohol/week: 0.0 standard drinks    Comment: 2-4 xs per month / beer or mixed drink   Drug use: No   Sexual activity: Not on file  Other Topics Concern   Not on file  Social History Narrative   Not on file   Social Determinants of Health   Financial Resource Strain: Not on file  Food Insecurity: Not on file  Transportation Needs: Not on file  Physical Activity: Not on file  Stress: Not on file  Social Connections: Not on file    Tobacco Counseling Counseling given: Not Answered Tobacco comments: quit 40 years ago    Clinical Intake:  Pre-visit preparation completed: Yes  Pain : No/denies pain     Nutritional Risks: None Diabetes: No  How often do you need to have someone help you when you read instructions, pamphlets, or other written materials from your doctor or pharmacy?: 1 - Never  Diabetic?no  Interpreter Needed?: No  Information entered by :: Kirke Shaggy, LPN   Activities of Daily Living In your present state of health, do you have any difficulty performing the following activities: 11/15/2021 04/24/2021  Hearing? N N  Vision? N N  Difficulty concentrating or making decisions? N N  Walking or climbing stairs? N N  Dressing or bathing? N N  Doing errands, shopping? N N  Preparing Food and eating ? N -  Using the Toilet? N -  In the past six months, have you accidently leaked urine? Y -  Do you have problems with loss of bowel control? N -  Managing your Medications? N -  Managing your Finances? N -  Housekeeping or managing your Housekeeping? N -  Some recent data might be hidden    Patient Care Team: Jerrol Banana., MD as PCP - General (Family Medicine) Dingeldein, Remo Lipps, MD as Consulting Physician  (Ophthalmology) Corey Skains, MD as Consulting Physician (Cardiology) Dasher, Rayvon Char, MD as Consulting Physician (Dermatology) Hollice Espy, MD as Consulting Physician (Urology) Lucky Cowboy Erskine Squibb, MD as Referring Physician (Vascular Surgery)  Indicate any recent Medical Services you may have received from other than [facility] providers in the past year (date may be approximate).     Assessment:   This is a routine wellness examination for Timothy Singleton.  Hearing/Vision screen No results found.  Dietary issues and exercise activities discussed:  Goals Addressed   None    Depression Screen PHQ 2/9 Scores 04/24/2021 11/12/2020 07/08/2020 10/05/2019 10/03/2018 03/30/2018 09/29/2017  PHQ - 2 Score 0 0 0 0 0 0 0  PHQ- 9 Score 2 - 2 3 2  0 1    Fall Risk Fall Risk  11/15/2021 04/24/2021 11/12/2020 07/08/2020 10/05/2019  Falls in the past year? 0 0 0 0 0  Comment - - - - -  Number falls in past yr: 0 0 0 0 0  Comment - - - - -  Injury with Fall? 0 0 0 0 0  Risk for fall due to : - No Fall Risks - - -  Follow up - Falls evaluation completed - Falls evaluation completed Falls evaluation completed    FALL RISK PREVENTION PERTAINING TO THE HOME:  Any stairs in or around the home? Yes  If so, are there any without handrails? No  Home free of loose throw rugs in walkways, pet beds, electrical cords, etc? Yes  Adequate lighting in your home to reduce risk of falls? Yes   ASSISTIVE DEVICES UTILIZED TO PREVENT FALLS:  Life alert? No  Use of a cane, walker or w/c? No  Grab bars in the bathroom? Yes  Shower chair or bench in shower? Yes  Elevated toilet seat or a handicapped toilet? Yes   TIMED UP AND GO:  Was the test performed? Yes .  Length of time to ambulate 10 feet: 4 sec.   Gait steady and fast without use of assistive device  Cognitive Function:     6CIT Screen 10/05/2019 11/13/2016  What Year? 0 points 0 points  What month? 0 points 0 points  What time? 0 points 0 points  Count  back from 20 0 points 0 points  Months in reverse 0 points 0 points  Repeat phrase 0 points 0 points  Total Score 0 0    Immunizations Immunization History  Administered Date(s) Administered   Fluad Quad(high Dose 65+) 07/08/2020   Influenza, High Dose Seasonal PF 07/23/2015, 09/30/2016, 09/29/2017, 10/03/2018   Influenza-Unspecified 08/15/2014, 07/08/2020   PFIZER(Purple Top)SARS-COV-2 Vaccination 11/29/2019, 12/20/2019   Pneumococcal Conjugate-13 07/11/2014   Pneumococcal Polysaccharide-23 09/30/2016   Td 09/20/2003   Tdap 01/04/2019   Zoster, Live 01/28/2010    TDAP status: Up to date  Flu Vaccine status: Declined, Education has been provided regarding the importance of this vaccine but patient still declined. Advised may receive this vaccine at local pharmacy or Health Dept. Aware to provide a copy of the vaccination record if obtained from local pharmacy or Health Dept. Verbalized acceptance and understanding.  Pneumococcal vaccine status: Up to date  Covid-19 vaccine status: Completed vaccines  Qualifies for Shingles Vaccine? Yes   Zostavax completed Yes   Shingrix Completed?: No.    Education has been provided regarding the importance of this vaccine. Patient has been advised to call insurance company to determine out of pocket expense if they have not yet received this vaccine. Advised may also receive vaccine at local pharmacy or Health Dept. Verbalized acceptance and understanding.  Screening Tests Health Maintenance  Topic Date Due   Zoster Vaccines- Shingrix (1 of 2) Never done   COVID-19 Vaccine (3 - Pfizer risk series) 01/17/2020   INFLUENZA VACCINE  05/19/2021   COLONOSCOPY (Pts 45-64yrs Insurance coverage will need to be confirmed)  11/17/2021   TETANUS/TDAP  01/03/2029   Pneumonia Vaccine 24+ Years old  Completed   Hepatitis C Screening  Completed   HPV VACCINES  Aged Out    Health Maintenance  Health Maintenance Due  Topic Date Due   Zoster Vaccines-  Shingrix (1 of 2) Never done   COVID-19 Vaccine (3 - Pfizer risk series) 01/17/2020   INFLUENZA VACCINE  05/19/2021   COLONOSCOPY (Pts 45-82yrs Insurance coverage will need to be confirmed)  11/17/2021    Colorectal cancer screening: No longer required.   Lung Cancer Screening: (Low Dose CT Chest recommended if Age 77-80 years, 30 pack-year currently smoking OR have quit w/in 15years.) does not qualify.   Additional Screening:  Hepatitis C Screening: does qualify; Completed 10/04/17  Vision Screening: Recommended annual ophthalmology exams for early detection of glaucoma and other disorders of the eye. Is the patient up to date with their annual eye exam?  Yes  Who is the provider or what is the name of the office in which the patient attends annual eye exams? [facility] If pt is not established with a provider, would they like to be referred to a provider to establish care? No .   Dental Screening: Recommended annual dental exams for proper oral hygiene  Community Resource Referral / Chronic Care Management: CRR required this visit?  No   CCM required this visit?  No      Plan:     I have personally reviewed and noted the following in the patients chart:   Medical and social history Use of alcohol, tobacco or illicit drugs  Current medications and supplements including opioid prescriptions. Patient is not currently taking opioid prescriptions. Functional ability and status Nutritional status Physical activity Advanced directives List of other physicians Hospitalizations, surgeries, and ER visits in previous 12 months Vitals Screenings to include cognitive, depression, and falls Referrals and appointments  In addition, I have reviewed and discussed with patient certain preventive protocols, quality metrics, and best practice recommendations. A written personalized care plan for preventive services as well as general preventive health recommendations were  provided to patient.     Dionisio David, LPN   02/26/210   Nurse Notes: none

## 2021-11-21 ENCOUNTER — Other Ambulatory Visit: Payer: Self-pay | Admitting: Family Medicine

## 2021-11-21 DIAGNOSIS — K219 Gastro-esophageal reflux disease without esophagitis: Secondary | ICD-10-CM

## 2021-11-21 NOTE — Telephone Encounter (Signed)
Requested Prescriptions  Pending Prescriptions Disp Refills   sucralfate (CARAFATE) 1 g tablet [Pharmacy Med Name: SUCRALFATE 1 GM TABLET] 360 tablet 0    Sig: TAKE 1 TABLET (1 G TOTAL) BY MOUTH 4 (FOUR) TIMES DAILY - BEFORE MEALS AND AT BEDTIME.     Gastroenterology: Antiacids Passed - 11/21/2021  1:12 PM      Passed - Valid encounter within last 12 months    Recent Outpatient Visits          7 months ago Annual physical exam   [facility] Jerrol Banana., MD   1 year ago Need for immunization against influenza   [facility] Jerrol Banana., MD   1 year ago Arteriosclerosis of coronary artery   [facility] Jerrol Banana., MD   2 years ago Annual physical exam   [facility] Jerrol [facility]   2 years ago Depression, unspecified depression type   [facility] Jerrol Banana., MD

## 2021-12-08 DIAGNOSIS — J069 Acute upper respiratory infection, unspecified: Secondary | ICD-10-CM | POA: Diagnosis not present

## 2021-12-17 DIAGNOSIS — J069 Acute upper respiratory infection, unspecified: Secondary | ICD-10-CM | POA: Diagnosis not present

## 2021-12-17 DIAGNOSIS — J029 Acute pharyngitis, unspecified: Secondary | ICD-10-CM | POA: Diagnosis not present

## 2021-12-23 ENCOUNTER — Other Ambulatory Visit: Payer: Self-pay | Admitting: Family Medicine

## 2021-12-23 DIAGNOSIS — G500 Trigeminal neuralgia: Secondary | ICD-10-CM

## 2021-12-24 ENCOUNTER — Other Ambulatory Visit: Payer: Self-pay

## 2021-12-24 DIAGNOSIS — F32A Depression, unspecified: Secondary | ICD-10-CM

## 2021-12-24 MED ORDER — SERTRALINE HCL 100 MG PO TABS: 100.0000 mg | ORAL_TABLET | Freq: Every day | ORAL | 0 refills | Status: DC

## 2021-12-25 ENCOUNTER — Other Ambulatory Visit: Payer: Self-pay

## 2021-12-25 ENCOUNTER — Encounter: Payer: Self-pay | Admitting: Family Medicine

## 2021-12-25 ENCOUNTER — Ambulatory Visit (INDEPENDENT_AMBULATORY_CARE_PROVIDER_SITE_OTHER): Payer: PPO | Admitting: Family Medicine

## 2021-12-25 VITALS — BP 144/70 | HR 58 | Temp 98.3°F | Resp 16 | Wt 179.0 lb

## 2021-12-25 DIAGNOSIS — Z79899 Other long term (current) drug therapy: Secondary | ICD-10-CM

## 2021-12-25 DIAGNOSIS — G500 Trigeminal neuralgia: Secondary | ICD-10-CM

## 2021-12-25 DIAGNOSIS — E7800 Pure hypercholesterolemia, unspecified: Secondary | ICD-10-CM | POA: Diagnosis not present

## 2021-12-25 DIAGNOSIS — I10 Essential (primary) hypertension: Secondary | ICD-10-CM

## 2021-12-25 DIAGNOSIS — G459 Transient cerebral ischemic attack, unspecified: Secondary | ICD-10-CM | POA: Diagnosis not present

## 2021-12-25 DIAGNOSIS — I2510 Atherosclerotic heart disease of native coronary artery without angina pectoris: Secondary | ICD-10-CM

## 2021-12-25 DIAGNOSIS — H6502 Acute serous otitis media, left ear: Secondary | ICD-10-CM

## 2021-12-25 DIAGNOSIS — Z8249 Family history of ischemic heart disease and other diseases of the circulatory system: Secondary | ICD-10-CM

## 2021-12-25 MED ORDER — AMOXICILLIN-POT CLAVULANATE 875-125 MG PO TABS: 1.0000 | ORAL_TABLET | Freq: Two times a day (BID) | ORAL | 0 refills | Status: DC

## 2021-12-25 MED ORDER — TRAMADOL HCL 50 MG PO TABS: 50.0000 mg | ORAL_TABLET | Freq: Four times a day (QID) | ORAL | 0 refills | Status: AC | PRN

## 2021-12-25 NOTE — Progress Notes (Signed)
?  ? ? ?Established patient visit ? ?I,April Miller,acting as a scribe for Wilhemena Durie, MD.,have documented all relevant documentation on the behalf of Wilhemena Durie, MD,as directed by  Wilhemena Durie, MD while in the presence of Wilhemena Durie, MD. ? ? ?Patient: Timothy Singleton   DOB: 22-Oct-1945   76 y.o. Male  MRN: [id number] ?Visit Date: 12/25/2021 ? ?Today's healthcare provider: Wilhemena Durie, MD  ? ?Chief Complaint  ?Patient presents with  ? Follow-up  ? ?Subjective  ?  ?HPI  ?Patient comes in today for several issues.  Smoked 40 years ago and wonders about screening for aortic aneurysm.  Told him this order has been placed.  He also has a brother with a aortic aneurysm so I think this is a totally appropriate order to place for screening ?He has not had labs in almost a year on carbamazepine for his vascular disease. ?Main complaint today is 1 of recent URI when he was at the [facility] and he now has left ear and left-sided throat pain.  No fevers and the cough is basically resolved.  He states he tested negative for the flu and for COVID. ?Patient has AWV with [facility] 11/17/2021. ? ?Hypertension, follow-up ? ?BP Readings from Last 3 Encounters:  ?12/25/21 (!) 144/70  ?11/17/21 120/60  ?07/04/21 135/74  ? Wt Readings from Last 3 Encounters:  ?12/25/21 179 lb (81.2 kg)  ?11/17/21 175 lb (79.4 kg)  ?07/04/21 179 lb (81.2 kg)  ?  ? ?He was last seen for hypertension 8 months ago.  ?BP at that visit was 122/63.  ?Management since that visit includes; on lisinopril. ? ?He reports good compliance with treatment. ?He is not having side effects. none ?He is following a Regular diet. ?He is exercising. ?He does not smoke. ? ?Use of agents associated with hypertension: none.  ? ?Outside blood pressures are not checking. ? ?Pertinent labs: ?Lab Results  ?Component Value Date  ? CHOL 116 07/08/2020  ? HDL 49 07/08/2020  ? [place] 52 07/08/2020  ? TRIG 70 07/08/2020  ? CHOLHDL 2.4 07/08/2020  ? Lab  Results  ?Component Value Date  ? NA 142 05/04/2021  ? K 4.4 05/04/2021  ? CREATININE 1.03 05/04/2021  ? GFRNONAA >60 05/04/2021  ? GLUCOSE 110 (H) 05/04/2021  ? TSH 1.780 07/08/2020  ?  ? ?The ASCVD Risk score (Arnett DK, et al., 2019) failed to calculate for the following reasons: ?  The patient has a prior MI or stroke diagnosis  ? ?--------------------------------------------------------------------------------------------------- ? ? ?Medications: ?Outpatient Medications Prior to Visit  ?Medication Sig  ? aspirin 81 MG tablet Take 81 mg by mouth daily.  ? atorvastatin (LIPITOR) 80 MG tablet atorvastatin 80 mg tablet ? TAKE 1 TABLET BY MOUTH EVERY DAY  ? carbamazepine (CARBATROL) 200 MG 12 hr capsule TAKE 1 CAPSULE BY MOUTH TWICE A DAY  ? lisinopril (ZESTRIL) 20 MG tablet Take 1 tablet by mouth daily.  ? Omega-3 Fatty Acids (FISH OIL) 1000 MG CAPS Take 1 capsule by mouth daily at 6 (six) AM. Unknown dose  ? pantoprazole (PROTONIX) 40 MG tablet TAKE 1 TABLET BY MOUTH EVERY DAY  ? sertraline (ZOLOFT) 100 MG tablet Take 1 tablet (100 mg total) by mouth daily.  ? tamsulosin (FLOMAX) 0.4 MG CAPS capsule TAKE 1 CAPSULE BY MOUTH EVERY DAY  ? [DISCONTINUED] anastrozole (ARIMIDEX) 1 MG tablet Take 1 mg by mouth every 14 (fourteen) days.  ? [DISCONTINUED] Cholecalciferol 50 MCG (2000 UT) CHEW   ? [  DISCONTINUED] sucralfate (CARAFATE) 1 g tablet TAKE 1 TABLET (1 G TOTAL) BY MOUTH 4 (FOUR) TIMES DAILY - BEFORE MEALS AND AT BEDTIME.  ? ?No facility-administered medications prior to visit.  ? ? ?Review of Systems  ?Constitutional:  Negative for appetite change, chills and fever.  ?Respiratory:  Negative for chest tightness, shortness of breath and wheezing.   ?Cardiovascular:  Negative for chest pain and palpitations.  ?Gastrointestinal:  Negative for abdominal pain, nausea and vomiting.  ? ?  ?  Objective  ?  ?BP (!) 144/70 (BP Location: Right Arm, Patient Position: Sitting, Cuff Size: Normal)   Pulse (!) 58   Temp 98.3 ?F  (36.8 ?C) (Temporal)   Resp 16   Wt 179 lb (81.2 kg)   SpO2 97%   BMI 24.97 kg/m?  ?BP Readings from Last 3 Encounters:  ?12/25/21 (!) 144/70  ?11/17/21 120/60  ?07/04/21 135/74  ? ?Wt Readings from Last 3 Encounters:  ?12/25/21 179 lb (81.2 kg)  ?11/17/21 175 lb (79.4 kg)  ?07/04/21 179 lb (81.2 kg)  ? ?  ? ?Physical Exam ?Vitals reviewed.  ?Constitutional:   ?   Appearance: He is well-developed.  ?HENT:  ?   Head: Normocephalic and atraumatic.  ?   Right Ear: Tympanic membrane and external ear normal.  ?   Left Ear: External ear normal.  ?   Ears:  ?   Comments: Left TM is full and bulging ?   Nose: Nose normal.  ?   Mouth/Throat:  ?   Mouth: Mucous membranes are moist.  ?   Pharynx: No oropharyngeal exudate.  ?Eyes:  ?   General: No scleral icterus. ?   Conjunctiva/sclera: Conjunctivae normal.  ?   Pupils: Pupils are equal, round, and reactive to light.  ?Neck:  ?   Thyroid: No thyromegaly.  ?Cardiovascular:  ?   Rate and Rhythm: Normal rate and regular rhythm.  ?   Heart sounds: Normal heart sounds.  ?Pulmonary:  ?   Effort: Pulmonary effort is normal.  ?   Breath sounds: Normal breath sounds.  ?Abdominal:  ?   Palpations: Abdomen is soft.  ?Musculoskeletal:     ?   General: Normal range of motion.  ?   Cervical back: Neck supple.  ?Lymphadenopathy:  ?   Cervical: No cervical adenopathy.  ?Skin: ?   General: Skin is warm and dry.  ?   Comments: Very fair skin.  ?Neurological:  ?   General: No focal deficit present.  ?   Mental Status: He is alert and oriented to person, place, and time.  ?   Deep Tendon Reflexes: Reflexes are normal and symmetric.  ?Psychiatric:     ?   Mood and Affect: Mood normal.     ?   Behavior: Behavior normal.     ?   Thought Content: Thought content normal.     ?   Judgment: Judgment normal.  ?  ? ? ?No results found for any visits on 12/25/21. ? Assessment & Plan  ?  ? ?1. Non-recurrent acute serous otitis media of left ear ?Augmentin and tramadol for the pain.  States he is having  a lot of pain and tramadol worked before for his trigeminal neuralgia ? ?2. High risk medication use ?With carbamazepine we will obtain lab work. ?- CBC w/Diff/Platelet ? ?3. Essential (primary) hypertension ?Fairly good control especially as he says he is in a lot of pain today ?- Comprehensive Metabolic Panel (CMET) ?- CBC w/Diff/Platelet ? ?  4. CAD in native artery ?All risk factors treated ? ?5. Pure hypercholesterolemia ?LDL goal less than 70 ?- Comprehensive Metabolic Panel (CMET) ?- Lipid Profile ? ?6. TIA (transient ischemic attack) ? ? ?7. Trigeminal neuralgia ?We will refer back to neurology as he remains on carbamazepine and I am wondering if some of this pain he is having could be recurrence of neuralgia ?- Carbamazepine Level (Tegretol), total ? ?8. Family history of aortic aneurysm ?Brother with aortic aneurysm.  Order screening ultrasound for family history ? ? ?No follow-ups on file.  ?   ? ?I, Wilhemena Durie, MD, have reviewed all documentation for this visit. The documentation on 12/25/21 for the exam, diagnosis, procedures, and orders are all accurate and complete. ? ? ? ?Lovie Agresta Cranford Mon, MD  ?[facility] ?[phone] (phone) ?[phone] (fax) ? ?[facility] ?

## 2021-12-31 DIAGNOSIS — Z79899 Other long term (current) drug therapy: Secondary | ICD-10-CM | POA: Diagnosis not present

## 2021-12-31 DIAGNOSIS — E7800 Pure hypercholesterolemia, unspecified: Secondary | ICD-10-CM | POA: Diagnosis not present

## 2021-12-31 DIAGNOSIS — I10 Essential (primary) hypertension: Secondary | ICD-10-CM | POA: Diagnosis not present

## 2021-12-31 DIAGNOSIS — G500 Trigeminal neuralgia: Secondary | ICD-10-CM | POA: Diagnosis not present

## 2022-01-01 DIAGNOSIS — Z85828 Personal history of other malignant neoplasm of skin: Secondary | ICD-10-CM | POA: Diagnosis not present

## 2022-01-01 DIAGNOSIS — D2261 Melanocytic nevi of right upper limb, including shoulder: Secondary | ICD-10-CM | POA: Diagnosis not present

## 2022-01-01 DIAGNOSIS — L821 Other seborrheic keratosis: Secondary | ICD-10-CM | POA: Diagnosis not present

## 2022-01-01 DIAGNOSIS — D2272 Melanocytic nevi of left lower limb, including hip: Secondary | ICD-10-CM | POA: Diagnosis not present

## 2022-01-01 DIAGNOSIS — D485 Neoplasm of uncertain behavior of skin: Secondary | ICD-10-CM | POA: Diagnosis not present

## 2022-01-01 DIAGNOSIS — X32XXXA Exposure to sunlight, initial encounter: Secondary | ICD-10-CM | POA: Diagnosis not present

## 2022-01-01 DIAGNOSIS — L570 Actinic keratosis: Secondary | ICD-10-CM | POA: Diagnosis not present

## 2022-01-01 DIAGNOSIS — Z85820 Personal history of malignant melanoma of skin: Secondary | ICD-10-CM | POA: Diagnosis not present

## 2022-01-01 DIAGNOSIS — Z08 Encounter for follow-up examination after completed treatment for malignant neoplasm: Secondary | ICD-10-CM | POA: Diagnosis not present

## 2022-01-01 DIAGNOSIS — C44729 Squamous cell carcinoma of skin of left lower limb, including hip: Secondary | ICD-10-CM | POA: Diagnosis not present

## 2022-01-01 DIAGNOSIS — Z86006 Personal history of melanoma in-situ: Secondary | ICD-10-CM | POA: Diagnosis not present

## 2022-01-01 LAB — CBC WITH DIFFERENTIAL/PLATELET
Basophils Absolute: 0 10*3/uL (ref 0.0–0.2)
Basos: 0 %
EOS (ABSOLUTE): 0.5 10*3/uL — ABNORMAL HIGH (ref 0.0–0.4)
Eos: 10 %
Hematocrit: 41.8 % (ref 37.5–51.0)
Hemoglobin: 14.1 g/dL (ref 13.0–17.7)
Immature Grans (Abs): 0 10*3/uL (ref 0.0–0.1)
Immature Granulocytes: 0 %
Lymphocytes Absolute: 1.7 10*3/uL (ref 0.7–3.1)
Lymphs: 37 %
MCH: 30.9 pg (ref 26.6–33.0)
MCHC: 33.7 g/dL (ref 31.5–35.7)
MCV: 92 fL (ref 79–97)
Monocytes Absolute: 0.5 10*3/uL (ref 0.1–0.9)
Monocytes: 12 %
Neutrophils Absolute: 1.8 10*3/uL (ref 1.4–7.0)
Neutrophils: 41 %
Platelets: 214 10*3/uL (ref 150–450)
RBC: 4.56 x10E6/uL (ref 4.14–5.80)
RDW: 13.4 % (ref 11.6–15.4)
WBC: 4.6 10*3/uL (ref 3.4–10.8)

## 2022-01-01 LAB — COMPREHENSIVE METABOLIC PANEL
ALT: 10 IU/L (ref 0–44)
AST: 19 IU/L (ref 0–40)
Albumin/Globulin Ratio: 2.8 — ABNORMAL HIGH (ref 1.2–2.2)
Albumin: 4.2 g/dL (ref 3.7–4.7)
Alkaline Phosphatase: 86 IU/L (ref 44–121)
BUN/Creatinine Ratio: 14 (ref 10–24)
BUN: 17 mg/dL (ref 8–27)
Bilirubin Total: 0.2 mg/dL (ref 0.0–1.2)
CO2: 27 mmol/L (ref 20–29)
Calcium: 9.7 mg/dL (ref 8.6–10.2)
Chloride: 103 mmol/L (ref 96–106)
Creatinine, Ser: 1.21 mg/dL (ref 0.76–1.27)
Globulin, Total: 1.5 g/dL (ref 1.5–4.5)
Glucose: 101 mg/dL — ABNORMAL HIGH (ref 70–99)
Potassium: 5 mmol/L (ref 3.5–5.2)
Sodium: 141 mmol/L (ref 134–144)
Total Protein: 5.7 g/dL — ABNORMAL LOW (ref 6.0–8.5)
eGFR: 62 mL/min/{1.73_m2} (ref >59)

## 2022-01-01 LAB — LIPID PANEL
Chol/HDL Ratio: 3.3 ratio (ref 0.0–5.0)
Cholesterol, Total: 124 mg/dL (ref 100–199)
HDL: 38 mg/dL — ABNORMAL LOW (ref >39)
LDL Chol Calc (NIH): 69 mg/dL (ref 0–99)
Triglycerides: 88 mg/dL (ref 0–149)
VLDL Cholesterol Cal: 17 mg/dL (ref 5–40)

## 2022-01-01 LAB — CARBAMAZEPINE LEVEL, TOTAL: Carbamazepine (Tegretol), S: 7.1 ug/mL (ref 4.0–12.0)

## 2022-01-02 ENCOUNTER — Other Ambulatory Visit: Payer: Self-pay

## 2022-01-02 ENCOUNTER — Other Ambulatory Visit: Payer: Self-pay | Admitting: *Deleted

## 2022-01-02 ENCOUNTER — Telehealth: Payer: Self-pay

## 2022-01-02 ENCOUNTER — Encounter (INDEPENDENT_AMBULATORY_CARE_PROVIDER_SITE_OTHER): Payer: Self-pay | Admitting: Nurse Practitioner

## 2022-01-02 ENCOUNTER — Ambulatory Visit (INDEPENDENT_AMBULATORY_CARE_PROVIDER_SITE_OTHER): Payer: PPO | Admitting: Nurse Practitioner

## 2022-01-02 ENCOUNTER — Ambulatory Visit (INDEPENDENT_AMBULATORY_CARE_PROVIDER_SITE_OTHER): Payer: PPO

## 2022-01-02 VITALS — BP 122/67 | HR 60 | Resp 16 | Wt 175.8 lb

## 2022-01-02 DIAGNOSIS — I6523 Occlusion and stenosis of bilateral carotid arteries: Secondary | ICD-10-CM

## 2022-01-02 DIAGNOSIS — Z87891 Personal history of nicotine dependence: Secondary | ICD-10-CM

## 2022-01-02 DIAGNOSIS — Z122 Encounter for screening for malignant neoplasm of respiratory organs: Secondary | ICD-10-CM

## 2022-01-02 DIAGNOSIS — G500 Trigeminal neuralgia: Secondary | ICD-10-CM

## 2022-01-02 DIAGNOSIS — I10 Essential (primary) hypertension: Secondary | ICD-10-CM

## 2022-01-02 DIAGNOSIS — Z8249 Family history of ischemic heart disease and other diseases of the circulatory system: Secondary | ICD-10-CM

## 2022-01-02 NOTE — Telephone Encounter (Signed)
Copied from [place] [id number]. Topic: General - Other ?>> Jan 02, 2022  3:58 PM Parke Poisson wrote: ?Reason for CRM: Per [facility] they will need an order for AAA [employer] [id number]. The order entered is incorrect,Thanks ?

## 2022-01-03 ENCOUNTER — Encounter (INDEPENDENT_AMBULATORY_CARE_PROVIDER_SITE_OTHER): Payer: Self-pay | Admitting: Nurse Practitioner

## 2022-01-03 NOTE — Progress Notes (Signed)
? ?Subjective:  ? ? Patient ID: Timothy Singleton, male    DOB: 1946/08/17, 76 y.o.   MRN: [id number] ?Chief Complaint  ?Patient presents with  ? Follow-up  ?  Ultrasound follow up  ? ? ?[name] is a 76 year old male that presents today for follow-up of his carotid artery stenosis.  He denies any issues such as TIA or amaurosis fugax-like symptoms.  The patient previously had a CT scan that showed about a 60% stenosis of his common carotid arteries.  Today his velocities have decreased from the velocities noted last year.  The ICA stenosis is 1 to 39% bilaterally.  The patient also has some concerns because his brother recently had an abdominal aortic aneurysm repair and he has been noted that it could be genetic.  He is also former smoker. ? ? ?Review of Systems  ?All other systems reviewed and are negative. ? ?   ?Objective:  ? Physical Exam ?Vitals reviewed.  ?HENT:  ?   Head: Normocephalic.  ?Cardiovascular:  ?   Rate and Rhythm: Normal rate.  ?   Pulses: Normal pulses.  ?Pulmonary:  ?   Effort: Pulmonary effort is normal.  ?Skin: ?   General: Skin is warm and dry.  ?Neurological:  ?   Mental Status: He is alert and oriented to person, place, and time.  ?Psychiatric:     ?   Mood and Affect: Mood normal.     ?   Behavior: Behavior normal.     ?   Thought Content: Thought content normal.     ?   Judgment: Judgment normal.  ? ? ?BP 122/67 (BP Location: Right Arm)   Pulse 60   Resp 16   Wt 175 lb 12.8 oz (79.7 kg)   BMI 24.52 kg/m?  ? ?Past Medical History:  ?Diagnosis Date  ? Arthritis   ? Cancer [facility])   ? melanoma / knee  ? Depression   ? GERD (gastroesophageal reflux disease)   ? Hyperlipemia   ? Hypertension   ? Past heart attack   ? Sleep apnea   ? ? ?Social History  ? ?Socioeconomic History  ? Marital status: Married  ?  Spouse name: Not on file  ? Number of children: 2  ? Years of education: Not on file  ? Highest education level: Bachelor's degree (e.g., BA, AB, BS)  ?Occupational History  ?  Occupation: retired  ?Tobacco Use  ? Smoking status: Former  ? Smokeless tobacco: Former  ? Tobacco comments:  ?  quit 40 years ago   ?Substance and Sexual Activity  ? Alcohol use: Yes  ?  Alcohol/week: 0.0 standard drinks  ?  Comment: 2-4 xs per month / beer or mixed drink  ? Drug use: No  ? Sexual activity: Not on file  ?Other Topics Concern  ? Not on file  ?Social History Narrative  ? Not on file  ? ?Social Determinants of Health  ? ?Financial Resource Strain: Low Risk   ? Difficulty of Paying Living Expenses: Not hard at all  ?Food Insecurity: No Food Insecurity  ? Worried About Charity fundraiser in the Last Year: Never true  ? Ran Out of Food in the Last Year: Never true  ?Transportation Needs: No Transportation Needs  ? Lack of Transportation (Medical): No  ? Lack of Transportation (Non-Medical): No  ?Physical Activity: Insufficiently Active  ? Days of Exercise per Week: 3 days  ? Minutes of Exercise per Session: 40 min  ?  Stress: No Stress Concern Present  ? Feeling of Stress : Not at all  ?Social Connections: Moderately Integrated  ? Frequency of Communication with Friends and Family: More than three times a week  ? Frequency of Social Gatherings with Friends and Family: Twice a week  ? Attends Religious Services: More than 4 times per year  ? Active Member of Clubs or Organizations: No  ? Attends Archivist Meetings: Never  ? Marital Status: Married  ?Intimate Partner Violence: Not At Risk  ? Fear of Current or Ex-Partner: No  ? Emotionally Abused: No  ? Physically Abused: No  ? Sexually Abused: No  ? ? ?Past Surgical History:  ?Procedure Laterality Date  ? BUNIONECTOMY    ? COLONOSCOPY WITH PROPOFOL N/A 11/17/2016  ? Procedure: COLONOSCOPY WITH PROPOFOL;  Surgeon: Jonathon Bellows, MD;  Location: [facility] ENDOSCOPY;  Service: Endoscopy;  Laterality: N/A;  ? CORONARY STENT PLACEMENT    ? EXTRACORPOREAL SHOCK WAVE LITHOTRIPSY    ? EXTRACORPOREAL SHOCK WAVE LITHOTRIPSY Right 12/30/2017  ? Procedure:  EXTRACORPOREAL SHOCK WAVE LITHOTRIPSY (ESWL);  Surgeon: Hollice Espy, MD;  Location: [facility] ORS;  Service: Urology;  Laterality: Right;  ? EXTRACORPOREAL SHOCK WAVE LITHOTRIPSY Right 06/20/2020  ? Procedure: EXTRACORPOREAL SHOCK WAVE LITHOTRIPSY (ESWL);  Surgeon: Hollice Espy, MD;  Location: [facility] ORS;  Service: Urology;  Laterality: Right;  ? HERNIA REPAIR    ? inguinal-right  ? JOINT REPLACEMENT    ? total shoulder replacement  ? KNEE SURGERY Right   ? TONSILLECTOMY    ? TOTAL SHOULDER REPLACEMENT    ? UPPER GI ENDOSCOPY  10/18/01  ? hiatus hernia  ? VASECTOMY    ? WRIST SURGERY    ? ? ?Family History  ?Problem Relation Age of Onset  ? Cancer Mother   ? Dementia Mother   ? Stroke Father   ? Heart disease Father   ? Hypertension Father   ? Breast cancer Sister   ? Parkinson's disease Brother   ? Hypertension Brother   ? Melanoma Maternal Grandmother   ? Bladder Cancer Neg Hx   ? Prostate cancer Neg Hx   ? Kidney cancer Neg Hx   ? ? ?No Known Allergies ? ?CBC Latest Ref Rng & Units 12/31/2021 05/04/2021 07/08/2020  ?WBC 3.4 - 10.8 x10E3/uL 4.6 5.0 4.9  ?Hemoglobin 13.0 - 17.7 g/dL 14.1 15.1 13.2  ?Hematocrit 37.5 - 51.0 % 41.8 44.4 39.9  ?Platelets 150 - 450 x10E3/uL 214 208 220  ? ? ? ? ?CMP  ?   ?Component Value Date/Time  ? NA 141 12/31/2021 0812  ? K 5.0 12/31/2021 0812  ? CL 103 12/31/2021 0812  ? CO2 27 12/31/2021 0812  ? GLUCOSE 101 (H) 12/31/2021 7341  ? GLUCOSE 110 (H) 05/04/2021 1344  ? BUN 17 12/31/2021 0812  ? CREATININE 1.21 12/31/2021 0812  ? CREATININE 1.18 10/04/2017 0935  ? CALCIUM 9.7 12/31/2021 0812  ? PROT 5.7 (L) 12/31/2021 9379  ? ALBUMIN 4.2 12/31/2021 0812  ? AST 19 12/31/2021 0812  ? ALT 10 12/31/2021 0812  ? ALKPHOS 86 12/31/2021 0812  ? BILITOT 0.2 12/31/2021 0812  ? GFRNONAA >60 05/04/2021 1344  ? GFRNONAA 61 10/04/2017 0935  ? GFRAA 79 07/08/2020 0906  ? GFRAA 71 10/04/2017 0935  ? ? ? ?No results found. ? ?   ?Assessment & Plan:  ? ?1. Bilateral carotid artery stenosis ?Today his  velocities have not increased from previous study 6 months ago.  Previous CT scan did show  velocities of the common carotid artery at 60%.  Based on velocities today this appears roughly stable.  No surgery or intervention at this time.  We will continue with 67-monthinterval follow-ups. ? ?We also discussed screening of his abdominal aortic aneurysm.  He has had a study ordered by his primary care physician however it was suggested that if he is able to get a study soon to return to our office.  We are happy to do an abdominal aortic ultrasound if needed for the patient.  He is advised to contact our office if he needs weeks as scheduled. ? ?2. Essential (primary) hypertension ?Continue antihypertensive medications as already ordered, these medications have been reviewed and there are no changes at this time.  ? ? ?Current Outpatient Medications on File Prior to Visit  ?Medication Sig Dispense Refill  ? aspirin 81 MG tablet Take 81 mg by mouth daily.    ? atorvastatin (LIPITOR) 80 MG tablet atorvastatin 80 mg tablet ? TAKE 1 TABLET BY MOUTH EVERY DAY    ? carbamazepine (CARBATROL) 200 MG 12 hr capsule TAKE 1 CAPSULE BY MOUTH TWICE A DAY 180 capsule 1  ? lisinopril (ZESTRIL) 20 MG tablet Take 1 tablet by mouth daily.    ? Omega-3 Fatty Acids (FISH OIL) 1000 MG CAPS Take 1 capsule by mouth daily at 6 (six) AM. Unknown dose    ? pantoprazole (PROTONIX) 40 MG tablet TAKE 1 TABLET BY MOUTH EVERY DAY 90 tablet 1  ? sertraline (ZOLOFT) 100 MG tablet Take 1 tablet (100 mg total) by mouth daily. 90 tablet 0  ? tamsulosin (FLOMAX) 0.4 MG CAPS capsule TAKE 1 CAPSULE BY MOUTH EVERY DAY 90 capsule 1  ? amoxicillin-clavulanate (AUGMENTIN) 875-125 MG tablet Take 1 tablet by mouth 2 (two) times daily. (Patient not taking: Reported on 01/02/2022) 14 tablet 0  ? ?No current facility-administered medications on file prior to visit.  ? ? ?There are no Patient Instructions on file for this visit. ?No follow-ups on file. ? ? ?FKris Hartmann NP ? ? ?

## 2022-01-05 ENCOUNTER — Other Ambulatory Visit: Payer: Self-pay

## 2022-01-05 DIAGNOSIS — Z8249 Family history of ischemic heart disease and other diseases of the circulatory system: Secondary | ICD-10-CM

## 2022-01-05 DIAGNOSIS — Z87891 Personal history of nicotine dependence: Secondary | ICD-10-CM

## 2022-01-12 ENCOUNTER — Other Ambulatory Visit: Payer: PPO

## 2022-01-27 DIAGNOSIS — B309 Viral conjunctivitis, unspecified: Secondary | ICD-10-CM | POA: Diagnosis not present

## 2022-02-10 ENCOUNTER — Other Ambulatory Visit: Payer: Self-pay | Admitting: *Deleted

## 2022-02-10 DIAGNOSIS — G500 Trigeminal neuralgia: Secondary | ICD-10-CM

## 2022-02-10 MED ORDER — CARBAMAZEPINE ER 200 MG PO TB12: 200.0000 mg | ORAL_TABLET | Freq: Two times a day (BID) | ORAL | 1 refills | Status: AC

## 2022-03-03 DIAGNOSIS — C44729 Squamous cell carcinoma of skin of left lower limb, including hip: Secondary | ICD-10-CM | POA: Diagnosis not present

## 2022-03-03 DIAGNOSIS — D2372 Other benign neoplasm of skin of left lower limb, including hip: Secondary | ICD-10-CM | POA: Diagnosis not present

## 2022-03-03 DIAGNOSIS — D485 Neoplasm of uncertain behavior of skin: Secondary | ICD-10-CM | POA: Diagnosis not present

## 2022-03-09 ENCOUNTER — Telehealth (INDEPENDENT_AMBULATORY_CARE_PROVIDER_SITE_OTHER): Payer: Self-pay | Admitting: Vascular Surgery

## 2022-03-09 NOTE — Telephone Encounter (Signed)
It looks like he had an ultrasound ordered by his family medicine doctor in March, which certainly would be able to determine if he has an AAA.  If he doesn't want that study done, we can do an AAA as we have available time to see Timothy Singleton or myself

## 2022-03-09 NOTE — Telephone Encounter (Signed)
Patient scheduled.

## 2022-03-09 NOTE — Telephone Encounter (Signed)
Patient called and wanted to make an appointment to have an aorta aneurysm because his brother was diagnosed with one and he was advise to have this test done.  Please advise.

## 2022-03-16 ENCOUNTER — Other Ambulatory Visit: Payer: Self-pay | Admitting: Family Medicine

## 2022-03-16 DIAGNOSIS — K219 Gastro-esophageal reflux disease without esophagitis: Secondary | ICD-10-CM

## 2022-03-17 ENCOUNTER — Other Ambulatory Visit (INDEPENDENT_AMBULATORY_CARE_PROVIDER_SITE_OTHER): Payer: PPO

## 2022-03-17 ENCOUNTER — Ambulatory Visit (INDEPENDENT_AMBULATORY_CARE_PROVIDER_SITE_OTHER): Payer: PPO | Admitting: Vascular Surgery

## 2022-03-19 ENCOUNTER — Telehealth (INDEPENDENT_AMBULATORY_CARE_PROVIDER_SITE_OTHER): Payer: Self-pay

## 2022-03-19 NOTE — Telephone Encounter (Signed)
Patient called in wanting someone to call his insurance about the scan he is to get. I'm guessing he knows or think he needs a PA. Patient left benefits number for his insurance   [employer] [phone]   Looks like patient has a AAA schd for 04/01/2022  Please is ok with you calling him to ask more question or with any concerns    Please call and advise

## 2022-03-30 ENCOUNTER — Other Ambulatory Visit (INDEPENDENT_AMBULATORY_CARE_PROVIDER_SITE_OTHER): Payer: Self-pay | Admitting: Nurse Practitioner

## 2022-03-30 DIAGNOSIS — Z8249 Family history of ischemic heart disease and other diseases of the circulatory system: Secondary | ICD-10-CM

## 2022-03-31 DIAGNOSIS — I2510 Atherosclerotic heart disease of native coronary artery without angina pectoris: Secondary | ICD-10-CM | POA: Diagnosis not present

## 2022-03-31 DIAGNOSIS — I10 Essential (primary) hypertension: Secondary | ICD-10-CM | POA: Diagnosis not present

## 2022-03-31 DIAGNOSIS — E782 Mixed hyperlipidemia: Secondary | ICD-10-CM | POA: Diagnosis not present

## 2022-04-01 ENCOUNTER — Ambulatory Visit (INDEPENDENT_AMBULATORY_CARE_PROVIDER_SITE_OTHER): Payer: PPO

## 2022-04-01 ENCOUNTER — Encounter (INDEPENDENT_AMBULATORY_CARE_PROVIDER_SITE_OTHER): Payer: Self-pay | Admitting: Nurse Practitioner

## 2022-04-01 ENCOUNTER — Ambulatory Visit (INDEPENDENT_AMBULATORY_CARE_PROVIDER_SITE_OTHER): Payer: PPO | Admitting: Nurse Practitioner

## 2022-04-01 VITALS — BP 153/73 | HR 54 | Resp 16 | Wt 176.4 lb

## 2022-04-01 DIAGNOSIS — Z8249 Family history of ischemic heart disease and other diseases of the circulatory system: Secondary | ICD-10-CM

## 2022-04-01 DIAGNOSIS — G4733 Obstructive sleep apnea (adult) (pediatric): Secondary | ICD-10-CM | POA: Diagnosis not present

## 2022-04-01 DIAGNOSIS — I10 Essential (primary) hypertension: Secondary | ICD-10-CM

## 2022-04-01 DIAGNOSIS — G500 Trigeminal neuralgia: Secondary | ICD-10-CM | POA: Diagnosis not present

## 2022-04-01 DIAGNOSIS — I6523 Occlusion and stenosis of bilateral carotid arteries: Secondary | ICD-10-CM

## 2022-04-12 ENCOUNTER — Encounter (INDEPENDENT_AMBULATORY_CARE_PROVIDER_SITE_OTHER): Payer: Self-pay | Admitting: Nurse Practitioner

## 2022-05-04 DIAGNOSIS — D2371 Other benign neoplasm of skin of right lower limb, including hip: Secondary | ICD-10-CM | POA: Diagnosis not present

## 2022-05-04 DIAGNOSIS — C44722 Squamous cell carcinoma of skin of right lower limb, including hip: Secondary | ICD-10-CM | POA: Diagnosis not present

## 2022-05-04 DIAGNOSIS — C44729 Squamous cell carcinoma of skin of left lower limb, including hip: Secondary | ICD-10-CM | POA: Diagnosis not present

## 2022-05-04 DIAGNOSIS — D485 Neoplasm of uncertain behavior of skin: Secondary | ICD-10-CM | POA: Diagnosis not present

## 2022-05-11 ENCOUNTER — Ambulatory Visit: Payer: Self-pay

## 2022-05-11 NOTE — Telephone Encounter (Signed)
  Chief Complaint: foot injury Symptoms: stepped on old rusty nail. Barely punctured skin, bleeding has stopped Frequency: today  Pertinent Negatives: Patient denies pain to area Disposition: '[]'$ ED /'[]'$ Urgent Care (no appt availability in office) / '[]'$ Appointment(In office/virtual)/ '[]'$  [place] [facility]/ '[x]'$ Home Care/ '[]'$ Refused Recommended Disposition /'[]'$ [place] [facility]/ '[]'$  Follow-up with PCP Additional Notes: pt was calling to see when last tetanus shot was and if he needed to come in or not. Advised pt to monitor and let know if any signs of infections occur. Pt verbalized understanding.   Reason for Disposition  Minor puncture wound  Answer Assessment - Initial Assessment Questions 1. LOCATION: "Where is the puncture located?"      R foot  2. OBJECT: "What was the object that punctured the skin?"      Old nail 3. DEPTH: "How deep do you think the puncture goes?"      Not very far 4. ONSET: "When did the injury occur?" (Minutes or hours)     Today just few mins ago  5. PAIN: "Is it painful?" If Yes, ask: "How bad is the pain?"  (Scale 1-10; or mild, moderate, severe)     none 6. TETANUS: "When was the last tetanus booster?"     2020  Protocols used: Puncture Wound-A-AH

## 2022-05-27 ENCOUNTER — Encounter: Payer: PPO | Admitting: Family Medicine

## 2022-05-31 DIAGNOSIS — N23 Unspecified renal colic: Secondary | ICD-10-CM | POA: Diagnosis not present

## 2022-05-31 DIAGNOSIS — N390 Urinary tract infection, site not specified: Secondary | ICD-10-CM | POA: Diagnosis not present

## 2022-05-31 DIAGNOSIS — N2 Calculus of kidney and ureter: Secondary | ICD-10-CM | POA: Diagnosis not present

## 2022-06-01 DIAGNOSIS — N390 Urinary tract infection, site not specified: Secondary | ICD-10-CM | POA: Diagnosis not present

## 2022-06-01 DIAGNOSIS — N23 Unspecified renal colic: Secondary | ICD-10-CM | POA: Diagnosis not present

## 2022-06-01 DIAGNOSIS — N2 Calculus of kidney and ureter: Secondary | ICD-10-CM | POA: Diagnosis not present

## 2022-06-08 DIAGNOSIS — R52 Pain, unspecified: Secondary | ICD-10-CM | POA: Insufficient documentation

## 2022-06-08 DIAGNOSIS — L03114 Cellulitis of left upper limb: Secondary | ICD-10-CM | POA: Diagnosis not present

## 2022-06-09 NOTE — Progress Notes (Signed)
Complete physical exam  I,April Miller,acting as a scribe for Wilhemena Durie, MD.,have documented all relevant documentation on the behalf of Wilhemena Durie, MD,as directed by  Wilhemena Durie, MD while in the presence of Wilhemena Durie, MD.   Patient: Timothy Singleton   DOB: 1946/09/18   76 y.o. Male  MRN: [id number] Visit Date: 06/10/2022  Today's healthcare provider: Wilhemena Durie, MD   Chief Complaint  Patient presents with   Annual Exam   Subjective    Timothy Singleton is a 76 y.o. male who presents today for a complete physical exam.  He reports consuming a general and low sodium diet. Home exercise routine includes walking and golf. He generally feels fairly well. He reports sleeping fairly well. He does  have additional problems to discuss today.  HPI  To discuss that he had recent kidney stones.  And that his calcium was slightly elevated so he stopped that 1 week ago. Carbamazepine capsule is more expensive than a tablet so he had neurology switch him to the 200 mg tablet Left arm which is presently being treated with Augmentin which is healed a lot. wishes referral to Dr. Hilarie Fredrickson with GI for chronic reflux problems despite pantoprazole and carafate.  Patient had AWV with [facility] on 11/17/2021.  Past Medical History:  Diagnosis Date   Arthritis    Cancer ([place])    melanoma / knee   Depression    GERD (gastroesophageal reflux disease)    Hyperlipemia    Hypertension    Past heart attack    Sleep apnea    Past Surgical History:  Procedure Laterality Date   BUNIONECTOMY     COLONOSCOPY WITH PROPOFOL N/A 11/17/2016   Procedure: COLONOSCOPY WITH PROPOFOL;  Surgeon: Jonathon Bellows, MD;  Location: [facility];  Service: Endoscopy;  Laterality: N/A;   CORONARY STENT PLACEMENT     EXTRACORPOREAL SHOCK WAVE LITHOTRIPSY     EXTRACORPOREAL SHOCK WAVE LITHOTRIPSY Right 12/30/2017   Procedure: EXTRACORPOREAL SHOCK WAVE LITHOTRIPSY (ESWL);  Surgeon:  Hollice Espy, MD;  Location: [facility];  Service: Urology;  Laterality: Right;   EXTRACORPOREAL SHOCK WAVE LITHOTRIPSY Right 06/20/2020   Procedure: EXTRACORPOREAL SHOCK WAVE LITHOTRIPSY (ESWL);  Surgeon: Hollice Espy, MD;  Location: [facility];  Service: Urology;  Laterality: Right;   HERNIA REPAIR     inguinal-right   JOINT REPLACEMENT     total shoulder replacement   KNEE SURGERY Right    TONSILLECTOMY     TOTAL SHOULDER REPLACEMENT     UPPER GI ENDOSCOPY  10/18/01   hiatus hernia   VASECTOMY     WRIST SURGERY     Social History   Socioeconomic History   Marital status: Married    Spouse name: Not on file   Number of children: 2   Years of education: Not on file   Highest education level: Bachelor's degree (e.g., BA, AB, BS)  Occupational History   Occupation: retired  Tobacco Use   Smoking status: Former   Smokeless tobacco: Former   Tobacco comments:    quit 40 years ago   Substance and Sexual Activity   Alcohol use: Yes    Alcohol/week: 0.0 standard drinks of alcohol    Comment: 2-4 xs per month / beer or mixed drink   Drug use: No   Sexual activity: Not on file  Other Topics Concern   Not on file  Social History Narrative   Not on file   Social  Determinants of Health   Financial Resource Strain: Low Risk  (11/17/2021)   Overall Financial Resource Strain (CARDIA)    Difficulty of Paying Living Expenses: Not hard at all  Food Insecurity: No Food Insecurity (11/17/2021)   Hunger Vital Sign    Worried About Running Out of Food in the Last Year: Never true    Ran Out of Food in the Last Year: Never true  Transportation Needs: No Transportation Needs (11/17/2021)   PRAPARE - Hydrologist (Medical): No    Lack of Transportation (Non-Medical): No  Physical Activity: Insufficiently Active (11/17/2021)   Exercise Vital Sign    Days of Exercise per Week: 3 days    Minutes of Exercise per Session: 40 min  Stress: No Stress Concern Present  (11/17/2021)   [place]    Feeling of Stress : Not at all  Social Connections: Moderately Integrated (11/17/2021)   Social Connection and Isolation Panel [NHANES]    Frequency of Communication with Friends and Family: More than three times a week    Frequency of Social Gatherings with Friends and Family: Twice a week    Attends Religious Services: More than 4 times per year    Active Member of [employer] or Organizations: No    Attends Archivist Meetings: Never    Marital Status: Married  Human resources officer Violence: Not At Risk (11/17/2021)   Humiliation, Afraid, Rape, and Kick questionnaire    Fear of Current or Ex-Partner: No    Emotionally Abused: No    Physically Abused: No    Sexually Abused: No   Family Status  Relation Name Status   Mother  Deceased at age 84   Father  Deceased at age 82   Sister  Alive   Brother  Alive   Daughter  Alive   Son  Alive   Brother  Deceased at age 78   Brother  Alive   MGM  (Not Specified)   Neg Hx  (Not Specified)   Family History  Problem Relation Age of Onset   Cancer Mother    Dementia Mother    Stroke Father    Heart disease Father    Hypertension Father    Breast cancer Sister    Parkinson's disease Brother    Hypertension Brother    Melanoma Maternal Grandmother    Bladder Cancer Neg Hx    Prostate cancer Neg Hx    Kidney cancer Neg Hx    No Known Allergies  Patient Care Team: Jerrol Banana., MD as PCP - General (Family Medicine) Dingeldein, Remo Lipps, MD as Consulting Physician (Ophthalmology) Corey Skains, MD as Consulting Physician (Cardiology) Dasher, Rayvon Char, MD as Consulting Physician (Dermatology) Hollice Espy, MD as Consulting Physician (Urology) Lucky Cowboy, Erskine Squibb, MD as Referring Physician (Vascular Surgery)   Medications: Outpatient Medications Prior to Visit  Medication Sig   aspirin 81 MG tablet Take 81 mg by mouth daily.    atorvastatin (LIPITOR) 80 MG tablet atorvastatin 80 mg tablet  TAKE 1 TABLET BY MOUTH EVERY DAY   carbamazepine (TEGRETOL XR) 200 MG 12 hr tablet Take 1 tablet (200 mg total) by mouth 2 (two) times daily.   Cholecalciferol (EQL VITAMIN D3) 50 MCG (2000 UT) CAPS    lisinopril (ZESTRIL) 20 MG tablet Take 1 tablet by mouth daily.   pantoprazole (PROTONIX) 40 MG tablet TAKE 1 TABLET BY MOUTH EVERY DAY   sertraline (ZOLOFT)  100 MG tablet Take 1 tablet (100 mg total) by mouth daily.   sucralfate (CARAFATE) 1 g tablet Take 1 g by mouth 4 (four) times daily -  with meals and at bedtime.   tamsulosin (FLOMAX) 0.4 MG CAPS capsule TAKE 1 CAPSULE BY MOUTH EVERY DAY   [DISCONTINUED] amoxicillin-clavulanate (AUGMENTIN) 875-125 MG tablet Take 1 tablet by mouth 2 (two) times daily. (Patient not taking: Reported on 01/02/2022)   [DISCONTINUED] Omega-3 Fatty Acids (FISH OIL) 1000 MG CAPS Take 1 capsule by mouth daily at 6 (six) AM. Unknown dose   No facility-administered medications prior to visit.    Review of Systems  Genitourinary:  Positive for frequency and urgency.  Hematological:  Bruises/bleeds easily.  All other systems reviewed and are negative.   Last lipids Lab Results  Component Value Date   CHOL 124 12/31/2021   HDL 38 (L) 12/31/2021   LDLCALC 69 12/31/2021   TRIG 88 12/31/2021   CHOLHDL 3.3 12/31/2021      Objective     BP 131/69 (BP Location: Right Arm, Patient Position: Sitting, Cuff Size: Normal)   Pulse 64   Resp 16   Ht '5\' 11"'$  (1.803 m)   Wt 174 lb (78.9 kg)   SpO2 97%   BMI 24.27 kg/m  BP Readings from Last 3 Encounters:  06/10/22 131/69  04/01/22 (!) 153/73  01/02/22 122/67   Wt Readings from Last 3 Encounters:  06/10/22 174 lb (78.9 kg)  04/01/22 176 lb 6.4 oz (80 kg)  01/02/22 175 lb 12.8 oz (79.7 kg)       Physical Exam Vitals reviewed.  Constitutional:      Appearance: He is well-developed.  HENT:     Head: Normocephalic and atraumatic.     Right  Ear: External ear normal.     Left Ear: External ear normal.     Nose: Nose normal.  Eyes:     General: No scleral icterus.    Conjunctiva/sclera: Conjunctivae normal.     Pupils: Pupils are equal, round, and reactive to light.  Neck:     Thyroid: No thyromegaly.  Cardiovascular:     Rate and Rhythm: Normal rate and regular rhythm.     Heart sounds: Normal heart sounds.  Pulmonary:     Effort: Pulmonary effort is normal.     Breath sounds: Normal breath sounds.  Abdominal:     Palpations: Abdomen is soft.  Genitourinary:    Penis: Normal.      Testes: Normal.     Comments: Defer DRE Musculoskeletal:     Cervical back: Neck supple.  Lymphadenopathy:     Cervical: No cervical adenopathy.  Skin:    General: Skin is warm and dry.     Comments: Very fair skin.  Neurological:     General: No focal deficit present.     Mental Status: He is alert and oriented to person, place, and time.     Deep Tendon Reflexes: Reflexes are normal and symmetric.  Psychiatric:        Mood and Affect: Mood normal.        Behavior: Behavior normal.        Thought Content: Thought content normal.        Judgment: Judgment normal.       Last depression screening scores    06/10/2022    9:49 AM 11/17/2021    8:59 AM 04/24/2021    1:32 PM  PHQ 2/9 Scores  PHQ - 2 Score 0  0 0  PHQ- 9 Score 3  2   Last fall risk screening    06/10/2022    9:49 AM  [place] in the past year? 0  Number falls in past yr: 0  Injury with Fall? 0  Risk for fall due to : No Fall Risks  Follow up Falls evaluation completed   Last Audit-C alcohol use screening    06/10/2022    9:49 AM  Alcohol Use Disorder Test (AUDIT)  1. How often do you have a drink containing alcohol? 2  2. How many drinks containing alcohol do you have on a typical day when you are drinking? 0  3. How often do you have six or more drinks on one occasion? 0  AUDIT-C Score 2   A score of 3 or more in women, and 4 or more in men  indicates increased risk for alcohol abuse, EXCEPT if all of the points are from question 1   No results found for any visits on 06/10/22.  Assessment & Plan    Routine Health Maintenance and Physical Exam  Exercise Activities and Dietary recommendations  Goals      DIET - EAT MORE FRUITS AND VEGETABLES     Increase water intake     Recommend to increase water intake to 6-8 8 oz glasses a day.        Immunization History  Administered Date(s) Administered   Fluad Quad(high Dose 65+) 07/08/2020   Influenza, High Dose Seasonal PF 07/23/2015, 09/30/2016, 09/29/2017, 10/03/2018   Influenza-Unspecified 08/15/2014, 07/08/2020   PFIZER(Purple Top)SARS-COV-2 Vaccination 11/29/2019, 12/20/2019   Pneumococcal Conjugate-13 07/11/2014   Pneumococcal Polysaccharide-23 09/30/2016   Td 09/20/2003   Tdap 01/04/2019   Zoster, Live 01/28/2010    Health Maintenance  Topic Date Due   Zoster Vaccines- Shingrix (1 of 2) Never done   COVID-19 Vaccine (3 - Pfizer risk series) 01/17/2020   COLONOSCOPY (Pts 45-36yr Insurance coverage will need to be confirmed)  11/17/2021   INFLUENZA VACCINE  05/19/2022   TETANUS/TDAP  01/03/2029   Pneumonia Vaccine 76 Years old  Completed   Hepatitis C Screening  Completed   HPV VACCINES  Aged Out    Discussed health benefits of physical activity, and encouraged him to engage in regular exercise appropriate for his age and condition.  1. Annual physical exam   2. Essential (primary) hypertension Start Dyazide q  AM and stop lisinopril. - TSH - CBC w/Diff/Platelet - Comprehensive Metabolic Panel (CMET) - triamterene-hydrochlorothiazide (DYAZIDE) 37.5-25 MG capsule; Take 1 each (1 capsule total) by mouth daily.  Dispense: 90 capsule; Refill: 3  3. Renal colic Starting Dyazide will hopefully lower risk of future stones  4. Cellulitis of left upper limb Clinically much improved on Augmentin  5. Gastroesophageal reflux disease without  esophagitis Referred to Dr. PHilarie Fredricksonfrom GI - sucralfate (CARAFATE) 1 g tablet; Take 1 g by mouth 4 (four) times daily -  with meals and at bedtime. Pantoprazole 6. Other hyperlipidemia  - TSH - Comprehensive Metabolic Panel (CMET) - Lipid Profile  7. High risk medication use   8. CAD in native artery  - TSH - CBC w/Diff/Platelet - Comprehensive Metabolic Panel (CMET)  9. Obstructive sleep apnea   10. Screening for prostate cancer Pt wishes - PSA  11. Vitamin D deficiency  - Cholecalciferol (EQL VITAMIN D3) 50 MCG (2000 UT) CAPS  12. Trigeminal neuralgia Carbamazepine controls      No follow-ups on file.  I, Wilhemena Durie, MD, have reviewed all documentation for this visit. The documentation on 06/12/22 for the exam, diagnosis, procedures, and orders are all accurate and complete.    Delle Andrzejewski Cranford Mon, MD  [facility] [phone] (phone) [phone] (fax)  [place]

## 2022-06-10 ENCOUNTER — Ambulatory Visit (INDEPENDENT_AMBULATORY_CARE_PROVIDER_SITE_OTHER): Payer: PPO | Admitting: Family Medicine

## 2022-06-10 ENCOUNTER — Encounter: Payer: Self-pay | Admitting: Family Medicine

## 2022-06-10 VITALS — BP 131/69 | HR 64 | Resp 16 | Ht 71.0 in | Wt 174.0 lb

## 2022-06-10 DIAGNOSIS — L03114 Cellulitis of left upper limb: Secondary | ICD-10-CM

## 2022-06-10 DIAGNOSIS — N23 Unspecified renal colic: Secondary | ICD-10-CM | POA: Diagnosis not present

## 2022-06-10 DIAGNOSIS — K219 Gastro-esophageal reflux disease without esophagitis: Secondary | ICD-10-CM | POA: Diagnosis not present

## 2022-06-10 DIAGNOSIS — G500 Trigeminal neuralgia: Secondary | ICD-10-CM

## 2022-06-10 DIAGNOSIS — I2510 Atherosclerotic heart disease of native coronary artery without angina pectoris: Secondary | ICD-10-CM | POA: Diagnosis not present

## 2022-06-10 DIAGNOSIS — I10 Essential (primary) hypertension: Secondary | ICD-10-CM

## 2022-06-10 DIAGNOSIS — E7849 Other hyperlipidemia: Secondary | ICD-10-CM | POA: Diagnosis not present

## 2022-06-10 DIAGNOSIS — Z0000 Encounter for general adult medical examination without abnormal findings: Secondary | ICD-10-CM | POA: Diagnosis not present

## 2022-06-10 DIAGNOSIS — Z125 Encounter for screening for malignant neoplasm of prostate: Secondary | ICD-10-CM | POA: Diagnosis not present

## 2022-06-10 DIAGNOSIS — Z79899 Other long term (current) drug therapy: Secondary | ICD-10-CM

## 2022-06-10 DIAGNOSIS — G4733 Obstructive sleep apnea (adult) (pediatric): Secondary | ICD-10-CM

## 2022-06-10 DIAGNOSIS — E559 Vitamin D deficiency, unspecified: Secondary | ICD-10-CM

## 2022-06-10 MED ORDER — TRIAMTERENE-HCTZ 37.5-25 MG PO CAPS: 1.0000 | ORAL_CAPSULE | Freq: Every day | ORAL | 3 refills | Status: AC

## 2022-06-10 NOTE — Patient Instructions (Signed)
Stop lisinopril.

## 2022-06-14 ENCOUNTER — Other Ambulatory Visit: Payer: Self-pay | Admitting: Family Medicine

## 2022-06-14 DIAGNOSIS — F32A Depression, unspecified: Secondary | ICD-10-CM

## 2022-06-30 ENCOUNTER — Other Ambulatory Visit (INDEPENDENT_AMBULATORY_CARE_PROVIDER_SITE_OTHER): Payer: Self-pay | Admitting: Nurse Practitioner

## 2022-06-30 DIAGNOSIS — I6523 Occlusion and stenosis of bilateral carotid arteries: Secondary | ICD-10-CM

## 2022-07-03 ENCOUNTER — Ambulatory Visit (INDEPENDENT_AMBULATORY_CARE_PROVIDER_SITE_OTHER): Payer: PPO | Admitting: Vascular Surgery

## 2022-07-03 ENCOUNTER — Ambulatory Visit (INDEPENDENT_AMBULATORY_CARE_PROVIDER_SITE_OTHER): Payer: PPO

## 2022-07-03 ENCOUNTER — Encounter (INDEPENDENT_AMBULATORY_CARE_PROVIDER_SITE_OTHER): Payer: Self-pay | Admitting: Vascular Surgery

## 2022-07-03 VITALS — BP 166/71 | HR 57 | Resp 17 | Ht 67.0 in | Wt 172.0 lb

## 2022-07-03 DIAGNOSIS — I10 Essential (primary) hypertension: Secondary | ICD-10-CM

## 2022-07-03 DIAGNOSIS — I6523 Occlusion and stenosis of bilateral carotid arteries: Secondary | ICD-10-CM | POA: Diagnosis not present

## 2022-07-03 DIAGNOSIS — I2510 Atherosclerotic heart disease of native coronary artery without angina pectoris: Secondary | ICD-10-CM

## 2022-07-03 DIAGNOSIS — E782 Mixed hyperlipidemia: Secondary | ICD-10-CM | POA: Diagnosis not present

## 2022-07-03 NOTE — Assessment & Plan Note (Signed)
Carotid duplex today shows 1 to 39% ICA stenosis bilaterally.  His velocities in his common carotid artery have improved somewhat from his previous study and now will do not appear to have greater than 50% stenosis.  This is likely due to aggressive medical therapy but he has known moderate stenosis in the common carotid arteries by CT scans previously.  Again, with improvement, we will continue current medical regimen and recheck in 6 months.

## 2022-07-03 NOTE — Progress Notes (Signed)
MRN : [id number]  Timothy Singleton is a 76 y.o. (08-Oct-1946) male who presents with chief complaint of No chief complaint on file. Marland Kitchen  History of Present Illness: Patient returns in follow-up of his carotid disease.  He is doing well today.  He denies any focal neurologic symptoms.  He is on a high dose of Lipitor as well as aspirin. Carotid duplex today shows 1 to 39% ICA stenosis bilaterally.  His velocities in his common carotid artery have improved somewhat from his previous study and now will do not appear to have greater than 50% stenosis.  Current Outpatient Medications  Medication Sig Dispense Refill   aspirin 81 MG tablet Take 81 mg by mouth daily.     atorvastatin (LIPITOR) 80 MG tablet atorvastatin 80 mg tablet  TAKE 1 TABLET BY MOUTH EVERY DAY     carbamazepine (TEGRETOL XR) 200 MG 12 hr tablet Take 1 tablet (200 mg total) by mouth 2 (two) times daily. 180 tablet 1   Cholecalciferol (EQL VITAMIN D3) 50 MCG (2000 UT) CAPS      pantoprazole (PROTONIX) 40 MG tablet TAKE 1 TABLET BY MOUTH EVERY DAY 90 tablet 1   sertraline (ZOLOFT) 100 MG tablet TAKE 1 TABLET BY MOUTH EVERY DAY 90 tablet 3   sucralfate (CARAFATE) 1 g tablet Take 1 g by mouth 4 (four) times daily -  with meals and at bedtime.     tamsulosin (FLOMAX) 0.4 MG CAPS capsule TAKE 1 CAPSULE BY MOUTH EVERY DAY 90 capsule 1   triamterene-hydrochlorothiazide (DYAZIDE) 37.5-25 MG capsule Take 1 each (1 capsule total) by mouth daily. 90 capsule 3   No current facility-administered medications for this visit.    Past Medical History:  Diagnosis Date   Arthritis    Cancer ([place])    melanoma / knee   Depression    GERD (gastroesophageal reflux disease)    Hyperlipemia    Hypertension    Past heart attack    Sleep apnea     Past Surgical History:  Procedure Laterality Date   BUNIONECTOMY     COLONOSCOPY WITH PROPOFOL N/A 11/17/2016   Procedure: COLONOSCOPY WITH PROPOFOL;  Surgeon: Jonathon Bellows, MD;  Location: [facility]  ENDOSCOPY;  Service: Endoscopy;  Laterality: N/A;   CORONARY STENT PLACEMENT     EXTRACORPOREAL SHOCK WAVE LITHOTRIPSY     EXTRACORPOREAL SHOCK WAVE LITHOTRIPSY Right 12/30/2017   Procedure: EXTRACORPOREAL SHOCK WAVE LITHOTRIPSY (ESWL);  Surgeon: Hollice Espy, MD;  Location: [facility] ORS;  Service: Urology;  Laterality: Right;   EXTRACORPOREAL SHOCK WAVE LITHOTRIPSY Right 06/20/2020   Procedure: EXTRACORPOREAL SHOCK WAVE LITHOTRIPSY (ESWL);  Surgeon: Hollice Espy, MD;  Location: [facility] ORS;  Service: Urology;  Laterality: Right;   HERNIA REPAIR     inguinal-right   JOINT REPLACEMENT     total shoulder replacement   KNEE SURGERY Right    TONSILLECTOMY     TOTAL SHOULDER REPLACEMENT     UPPER GI ENDOSCOPY  10/18/01   hiatus hernia   VASECTOMY     WRIST SURGERY       Social History   Tobacco Use   Smoking status: Former   Smokeless tobacco: Former   Tobacco comments:    quit 40 years ago   Substance Use Topics   Alcohol use: Yes    Alcohol/week: 0.0 standard drinks of alcohol    Comment: 2-4 xs per month / beer or mixed drink   Drug use: No      Family History  Problem Relation Age of Onset   Cancer Mother    Dementia Mother    Stroke Father    Heart disease Father    Hypertension Father    Breast cancer Sister    Parkinson's disease Brother    Hypertension Brother    Melanoma Maternal Grandmother    Bladder Cancer Neg Hx    Prostate cancer Neg Hx    Kidney cancer Neg Hx      No Known Allergies  REVIEW OF SYSTEMS (Negative unless checked)   Constitutional: '[]'$ Weight loss  '[]'$ Fever  '[]'$ Chills Cardiac: '[]'$ Chest pain   '[]'$ Chest pressure   '[]'$ Palpitations   '[]'$ Shortness of breath when laying flat   '[]'$ Shortness of breath at rest   '[x]'$ Shortness of breath with exertion. Vascular:  '[]'$ Pain in legs with walking   '[]'$ Pain in legs at rest   '[]'$ Pain in legs when laying flat   '[]'$ Claudication   '[]'$ Pain in feet when walking  '[]'$ Pain in feet at rest  '[]'$ Pain in feet when laying flat    '[]'$ History of DVT   '[]'$ Phlebitis   '[]'$ Swelling in legs   '[]'$ Varicose veins   '[]'$ Non-healing ulcers Pulmonary:   '[]'$ Uses home oxygen   '[]'$ Productive cough   '[]'$ Hemoptysis   '[]'$ Wheeze  '[]'$ COPD   '[]'$ Asthma Neurologic:  '[x]'$ Dizziness  '[]'$ Blackouts   '[]'$ Seizures   '[]'$ History of stroke   '[]'$ History of TIA  '[x]'$ Aphasia   '[]'$ Temporary blindness   '[]'$ Dysphagia   '[]'$ Weakness or numbness in arms   '[]'$ Weakness or numbness in legs Musculoskeletal:  '[x]'$ Arthritis   '[]'$ Joint swelling   '[]'$ Joint pain   '[]'$ Low back pain Hematologic:  '[]'$ Easy bruising  '[]'$ Easy bleeding   '[]'$ Hypercoagulable state   '[]'$ Anemic  '[]'$ Hepatitis Gastrointestinal:  '[]'$ Blood in stool   '[]'$ Vomiting blood  '[x]'$ Gastroesophageal reflux/heartburn   '[]'$ Abdominal pain Genitourinary:  '[]'$ Chronic kidney disease   '[]'$ Difficult urination  '[]'$ Frequent urination  '[]'$ Burning with urination   '[]'$ Hematuria Skin:  '[]'$ Rashes   '[]'$ Ulcers   '[]'$ Wounds Psychological:  '[]'$ History of anxiety   '[]'$  History of major depression.  Physical Examination  Vitals:   07/03/22 0933  BP: (!) 166/71  Pulse: (!) 57  Resp: 17  Weight: 172 lb (78 kg)  Height: '5\' 7"'$  (1.702 m)   Body mass index is 26.94 kg/m. Gen:  WD/WN, NAD Head: [place]/AT, No temporalis wasting. Ear/Nose/Throat: Hearing grossly intact, nares w/o erythema or drainage, trachea midline Eyes: Conjunctiva clear. Sclera non-icteric Neck: Supple.  Soft right carotid bruit  Pulmonary:  Good air movement, equal and clear to auscultation bilaterally.  Cardiac: RRR, No JVD Vascular:  Vessel Right Left  Radial Palpable Palpable           Musculoskeletal: M/S 5/5 throughout.  No deformity or atrophy. No edema. Neurologic: CN 2-12 intact. Sensation grossly intact in extremities.  Symmetrical.  Speech is fluent. Motor exam as listed above. Psychiatric: Judgment intact, Mood & affect appropriate for pt's clinical situation. Dermatologic: No rashes or ulcers noted.  No cellulitis or open wounds.     CBC Lab Results  Component Value Date   WBC 4.6  12/31/2021   HGB 14.1 12/31/2021   HCT 41.8 12/31/2021   MCV 92 12/31/2021   PLT 214 12/31/2021    BMET    Component Value Date/Time   NA 141 12/31/2021 0812   K 5.0 12/31/2021 0812   CL 103 12/31/2021 0812   CO2 27 12/31/2021 0812   GLUCOSE 101 (H) 12/31/2021 0812   GLUCOSE 110 (H) 05/04/2021 1344   BUN 17 12/31/2021 4259  CREATININE 1.21 12/31/2021 0812   CREATININE 1.18 10/04/2017 0935   CALCIUM 9.7 12/31/2021 0812   GFRNONAA >60 05/04/2021 1344   GFRNONAA 61 10/04/2017 0935   GFRAA 79 07/08/2020 0906   GFRAA 71 10/04/2017 0935   CrCl cannot be calculated (Patient's most recent lab result is older than the maximum 21 days allowed.).  COAG No results found for: "INR", "PROTIME"  Radiology No results found.   Assessment/Plan Essential (primary) hypertension blood pressure control important in reducing the progression of atherosclerotic disease. On appropriate oral medications.     Combined fat and carbohydrate induced hyperlipemia lipid control important in reducing the progression of atherosclerotic disease. Continue statin therapy     CAD in native artery If we end up doing surgery, we will ask his cardiologist to assess his surgical risk.  Bilateral carotid artery stenosis Carotid duplex today shows 1 to 39% ICA stenosis bilaterally.  His velocities in his common carotid artery have improved somewhat from his previous study and now will do not appear to have greater than 50% stenosis.  This is likely due to aggressive medical therapy but he has known moderate stenosis in the common carotid arteries by CT scans previously.  Again, with improvement, we will continue current medical regimen and recheck in 6 months.    Leotis Pain, MD  07/03/2022 10:21 AM    This note was created with Dragon medical transcription system.  Any errors from dictation are purely unintentional

## 2022-07-07 DIAGNOSIS — D2372 Other benign neoplasm of skin of left lower limb, including hip: Secondary | ICD-10-CM | POA: Diagnosis not present

## 2022-07-07 DIAGNOSIS — C44729 Squamous cell carcinoma of skin of left lower limb, including hip: Secondary | ICD-10-CM | POA: Diagnosis not present

## 2022-07-16 DIAGNOSIS — Z85828 Personal history of other malignant neoplasm of skin: Secondary | ICD-10-CM | POA: Diagnosis not present

## 2022-07-16 DIAGNOSIS — D2262 Melanocytic nevi of left upper limb, including shoulder: Secondary | ICD-10-CM | POA: Diagnosis not present

## 2022-07-16 DIAGNOSIS — D2261 Melanocytic nevi of right upper limb, including shoulder: Secondary | ICD-10-CM | POA: Diagnosis not present

## 2022-07-16 DIAGNOSIS — D2272 Melanocytic nevi of left lower limb, including hip: Secondary | ICD-10-CM | POA: Diagnosis not present

## 2022-07-16 DIAGNOSIS — Z85820 Personal history of malignant melanoma of skin: Secondary | ICD-10-CM | POA: Diagnosis not present

## 2022-07-16 DIAGNOSIS — L570 Actinic keratosis: Secondary | ICD-10-CM | POA: Diagnosis not present

## 2022-07-18 ENCOUNTER — Other Ambulatory Visit: Payer: Self-pay | Admitting: Family Medicine

## 2022-07-18 DIAGNOSIS — K219 Gastro-esophageal reflux disease without esophagitis: Secondary | ICD-10-CM

## 2022-07-20 DIAGNOSIS — H2513 Age-related nuclear cataract, bilateral: Secondary | ICD-10-CM | POA: Diagnosis not present

## 2022-07-20 NOTE — Telephone Encounter (Signed)
Requested Prescriptions  Pending Prescriptions Disp Refills  . pantoprazole (PROTONIX) 40 MG tablet [Pharmacy Med Name: PANTOPRAZOLE SOD DR 40 MG TAB] 90 tablet 1    Sig: TAKE 1 TABLET BY MOUTH EVERY DAY     Gastroenterology: Proton Pump Inhibitors Passed - 07/18/2022  2:19 PM      Passed - Valid encounter within last 12 months    Recent Outpatient Visits          1 month ago Annual physical exam   [facility] Jerrol Banana., MD   6 months ago Non-recurrent acute serous otitis media of left ear   [facility] Jerrol Banana., MD   1 year ago Annual physical exam   [facility] Jerrol Banana., MD   2 years ago Need for immunization against influenza   [facility] Jerrol Banana., MD   2 years ago Arteriosclerosis of coronary artery   [facility] Jerrol Banana., MD             . tamsulosin (FLOMAX) 0.4 MG CAPS capsule [Pharmacy Med Name: TAMSULOSIN HCL 0.4 MG CAPSULE] 90 capsule 1    Sig: TAKE 1 CAPSULE BY MOUTH EVERY DAY     Urology: Alpha-Adrenergic Blocker Failed - 07/18/2022  2:19 PM      Failed - PSA in normal range and within 360 days    Prostate Specific Ag, Serum  Date Value Ref Range Status  04/11/2020 2.1 0.0 - 4.0 ng/mL Final    Comment:    Roche ECLIA methodology. According to the American Urological Association, Serum PSA should decrease and remain at undetectable levels after radical prostatectomy. The AUA defines biochemical recurrence as an initial PSA value 0.2 ng/mL or greater followed by a subsequent confirmatory PSA value 0.2 ng/mL or greater. Values obtained with different assay methods or kits cannot be used interchangeably. Results cannot be interpreted as absolute evidence of the presence or absence of malignant disease.          Failed - Last BP in normal range    BP Readings from Last 1 Encounters:  07/03/22 (!) 166/71         Passed  - Valid encounter within last 12 months    Recent Outpatient Visits          1 month ago Annual physical exam   [facility] Jerrol Banana., MD   6 months ago Non-recurrent acute serous otitis media of left ear   [facility] Jerrol Banana., MD   1 year ago Annual physical exam   [facility] Jerrol Banana., MD   2 years ago Need for immunization against influenza   [facility] Jerrol Banana., MD   2 years ago Arteriosclerosis of coronary artery   [facility] Jerrol Banana., MD

## 2022-07-29 DIAGNOSIS — R42 Dizziness and giddiness: Secondary | ICD-10-CM | POA: Diagnosis not present

## 2022-07-29 DIAGNOSIS — R5383 Other fatigue: Secondary | ICD-10-CM | POA: Diagnosis not present

## 2022-07-29 DIAGNOSIS — R112 Nausea with vomiting, unspecified: Secondary | ICD-10-CM | POA: Diagnosis not present

## 2022-07-29 DIAGNOSIS — R531 Weakness: Secondary | ICD-10-CM | POA: Diagnosis not present

## 2022-08-14 ENCOUNTER — Encounter: Payer: Self-pay | Admitting: Internal Medicine

## 2022-08-17 ENCOUNTER — Encounter (INDEPENDENT_AMBULATORY_CARE_PROVIDER_SITE_OTHER): Payer: Self-pay

## 2022-10-06 ENCOUNTER — Encounter: Payer: Self-pay | Admitting: *Deleted

## 2022-10-22 ENCOUNTER — Encounter: Payer: Self-pay | Admitting: Internal Medicine

## 2022-10-22 ENCOUNTER — Ambulatory Visit: Payer: Medicare HMO | Admitting: Internal Medicine

## 2022-10-22 VITALS — BP 140/80 | HR 60 | Ht 70.0 in | Wt 181.1 lb

## 2022-10-22 DIAGNOSIS — K30 Functional dyspepsia: Secondary | ICD-10-CM

## 2022-10-22 DIAGNOSIS — Z86010 Personal history of colonic polyps: Secondary | ICD-10-CM

## 2022-10-22 DIAGNOSIS — K219 Gastro-esophageal reflux disease without esophagitis: Secondary | ICD-10-CM | POA: Diagnosis not present

## 2022-10-22 MED ORDER — FAMOTIDINE 20 MG PO TABS: 20.0000 mg | ORAL_TABLET | Freq: Every evening | ORAL | 3 refills | Status: DC | PRN

## 2022-10-22 NOTE — Progress Notes (Signed)
Patient ID: Timothy Singleton, male   DOB: 04-Feb-1946, 77 y.o.   MRN: [id number] HPI: Timothy Singleton is a 77 year old male with a history of GERD, remote colonic polyps, colonic diverticulosis, internal hemorrhoids, hypertension, hyperlipidemia, sleep apnea who is seen to evaluate ongoing reflux.  He is here today with his wife.  Previous GI care has been with Dr. Vira Agar and Dr. Vicente Males in [facility]. Remote upper endoscopy greater than 20 years ago, records not available today Colonoscopy last 11/17/2016: Diverticulosis in the sigmoid, nonbleeding internal hemorrhoids medium in size seen on retroflexion. Preparation adequate and exam to the cecum  He reports that he has had 25 years of acid reflux.  This is worse at night.  He has been taking PPI on a continuous basis for multiple years.  Years ago he use Nexium and most recently he has been using pantoprazole 40 mg daily.  He does not always take this before breakfast.  He does at least start the night sleeping on multiple pillows with his head propped.  They have also tried elevating the head of the bed.  He does tend to gravitate to flat during the night while sleeping.  He recalls years ago coughing within 20 minutes of eating and Dr. Vira Agar started ranitidine at that time.  He knows that his reflux is triggered by certain foods as well as the timing of foods.  If he eats chili, peanut butter or chocolate in the evening he will have a bad night of reflux.  He has pyrosis as his main symptom as well as some morning coughing.  Several times his relative who is a dentist prescribed antibiotics as a precaution against aspiration pneumonia based on significant morning coughing.  Also for a year he has been using Carafate tablets initially 4 times a day but most recently 2 times a day.  It does not seem to help much.  He will occasionally feel irritation in his nasal passages after eating.  Also sometimes with bending over he will feel a "sour  stomach".  No dysphagia symptom.  Bowel habits are regular, no frequent diarrhea or constipation.  No blood in stool or melena.  No known GI significant family history Former tobacco smoker, quit 40 years ago. Infrequent alcohol intake.  Past Medical History:  Diagnosis Date   Arthritis    Cancer ([place])    melanoma / knee   Depression    Diverticulosis    GERD (gastroesophageal reflux disease)    Hyperlipemia    Hypertension    Internal hemorrhoids    Past heart attack    Sleep apnea    Trigeminal neuralgia     Past Surgical History:  Procedure Laterality Date   BUNIONECTOMY     COLONOSCOPY WITH PROPOFOL N/A 11/17/2016   Procedure: COLONOSCOPY WITH PROPOFOL;  Surgeon: Jonathon Bellows, MD;  Location: [facility] END[facility];  Service: Endoscopy;  Laterality: N/A;   CORONARY STENT PLACEMENT     EXTRACORPOREAL SHOCK WAVE LITHOTRIPSY     EXTRACORPOREAL SHOCK WAVE LITHOTRIPSY Right 12/30/2017   Procedure: EXTRACORPOREAL SHOCK WAVE LITHOTRIPSY (ESWL);  Surgeon: Hollice Espy, MD;  Location: [facility] ORS;  Service: Urology;  Laterality: Right;   EXTRACORPOREAL SHOCK WAVE LITHOTRIPSY Right 06/20/2020   Procedure: EXTRACORPOREAL SHOCK WAVE LITHOTRIPSY (ESWL);  Surgeon: Hollice Espy, MD;  Location: [facility] ORS;  Service: Urology;  Laterality: Right;   HERNIA REPAIR     inguinal-right   JOINT REPLACEMENT     total shoulder replacement   KNEE SURGERY Right  TONSILLECTOMY     TOTAL SHOULDER REPLACEMENT     UPPER GI ENDOSCOPY  10/18/01   hiatus hernia   VASECTOMY     WRIST SURGERY      Outpatient Medications Prior to Visit  Medication Sig Dispense Refill   aspirin 81 MG tablet Take 81 mg by mouth daily.     atorvastatin (LIPITOR) 80 MG tablet atorvastatin 80 mg tablet  TAKE 1 TABLET BY MOUTH EVERY DAY     carbamazepine (TEGRETOL XR) 200 MG 12 hr tablet Take 1 tablet (200 mg total) by mouth 2 (two) times daily. 180 tablet 1   Cholecalciferol (EQL VITAMIN D3) 50 MCG (2000 UT) CAPS      pantoprazole  (PROTONIX) 40 MG tablet TAKE 1 TABLET BY MOUTH EVERY DAY 90 tablet 1   sertraline (ZOLOFT) 100 MG tablet TAKE 1 TABLET BY MOUTH EVERY DAY 90 tablet 3   tamsulosin (FLOMAX) 0.4 MG CAPS capsule TAKE 1 CAPSULE BY MOUTH EVERY DAY 90 capsule 1   triamterene-hydrochlorothiazide (DYAZIDE) 37.5-25 MG capsule Take 1 each (1 capsule total) by mouth daily. 90 capsule 3   sucralfate (CARAFATE) 1 g tablet Take 1 g by mouth 4 (four) times daily -  with meals and at bedtime.     No facility-administered medications prior to visit.    No Known Allergies  Family History  Problem Relation Age of Onset   Cancer Mother    Dementia Mother    Stroke Father    Heart disease Father    Hypertension Father    Breast cancer Sister    Parkinson's disease Brother    Hypertension Brother    Melanoma Maternal Grandmother    Bladder Cancer Neg Hx    Prostate cancer Neg Hx    Kidney cancer Neg Hx     Social History   Tobacco Use   Smoking status: Former   Smokeless tobacco: Former   Tobacco comments:    quit 40 years ago   Substance Use Topics   Alcohol use: Yes    Alcohol/week: 0.0 standard drinks of alcohol    Comment: 2-4 xs per month / beer or mixed drink   Drug use: No    ROS: As per history of present illness, otherwise negative  BP (!) 140/80 (BP Location: Left Arm, Patient Position: Sitting, Cuff Size: Normal)   Pulse 60   Ht '5\' 10"'$  (1.778 m)   Wt 181 lb 2 oz (82.2 kg)   SpO2 94%   BMI 25.99 kg/m  Constitutional: Well-developed and well-nourished. No distress. HEENT: Normocephalic and atraumatic. Oropharynx is clear and moist.  No scleral icterus. Cardiovascular: Normal rate, regular rhythm and intact distal pulses. No M/R/G Pulmonary/chest: Effort normal and breath sounds normal. No wheezing, rales or rhonchi. Abdominal: Soft, nontender, nondistended. Bowel sounds active throughout. There are no masses palpable. No hepatosplenomegaly. Extremities: no clubbing, cyanosis, or  edema Neurological: Alert and oriented to person place and time. Skin: Skin is warm and dry.  Psychiatric: Normal mood and affect. Behavior is normal.  RELEVANT LABS AND IMAGING: CBC    Component Value Date/Time   WBC 4.6 12/31/2021 0812   WBC 5.0 05/04/2021 1344   RBC 4.56 12/31/2021 0812   RBC 4.77 05/04/2021 1344   HGB 14.1 12/31/2021 0812   HCT 41.8 12/31/2021 0812   PLT 214 12/31/2021 0812   MCV 92 12/31/2021 0812   MCH 30.9 12/31/2021 0812   MCH 31.7 05/04/2021 1344   MCHC 33.7 12/31/2021 0812  MCHC 34.0 05/04/2021 1344   RDW 13.4 12/31/2021 0812   LYMPHSABS 1.7 12/31/2021 0812   EOSABS 0.5 (H) 12/31/2021 0812   BASOSABS 0.0 12/31/2021 0812    CMP     Component Value Date/Time   NA 141 12/31/2021 0812   K 5.0 12/31/2021 0812   CL 103 12/31/2021 0812   CO2 27 12/31/2021 0812   GLUCOSE 101 (H) 12/31/2021 0812   GLUCOSE 110 (H) 05/04/2021 1344   BUN 17 12/31/2021 0812   CREATININE 1.21 12/31/2021 0812   CREATININE 1.18 10/04/2017 0935   CALCIUM 9.7 12/31/2021 0812   PROT 5.7 (L) 12/31/2021 0812   ALBUMIN 4.2 12/31/2021 0812   AST 19 12/31/2021 0812   ALT 10 12/31/2021 0812   ALKPHOS 86 12/31/2021 0812   BILITOT 0.2 12/31/2021 0812   GFRNONAA >60 05/04/2021 1344   GFRNONAA 61 10/04/2017 0935   GFRAA 79 07/08/2020 0906   GFRAA 71 10/04/2017 0935    ASSESSMENT/PLAN: 77 year old male with a history of GERD, remote colonic polyps, colonic diverticulosis, internal hemorrhoids, hypertension, hyperlipidemia, sleep apnea who is seen to evaluate ongoing reflux.  GERD/pyrosis --he also has some LPR type symptoms but he does have classic heartburn despite daily PPI.  We discussed that the timing of his pantoprazole as well as the timing of his fluid and food intake may be contributing to his symptoms.  Either way upper endoscopy is certainly warranted.  I recommended this and we reviewed the risk, benefits and alternatives and he is agreeable and wishes to proceed --  Continue pantoprazole 40 mg but take at least 30 minutes before the first meal of the day -- Could use famotidine 20 mg in the evening on an as-needed basis for breakthrough heartburn or reflux symptoms -- Upper endoscopy in the [facility] -- GERD diet and lifestyle precautions; GERD diet handout given.  Also recommended that he not lie down or go to bed within 60 minutes of any liquid intake and 120 minutes of any solid food intake.  2.  Remote colon polyps --normal colonoscopy in January 2018 without polyps.  No family history or personal history of advanced colonic polyps.  Repeat surveillance will be recommended around January 2028 based on his overall health at that time.  This will likely not be beneficial as he will be 77 years old at that time but we can discuss this more in the future.  Discontinuing colonoscopy for screening or surveillance does not mean we would not fully evaluate troublesome GI symptoms in the future.      WC:BJSEGBT, Retia Passe., [place] No address on file

## 2022-10-22 NOTE — Patient Instructions (Addendum)
_______________________________________________________  If you are age 78 or older, your body mass index should be between 23-30. Your Body mass index is 25.99 kg/m. If this is out of the aforementioned range listed, please consider follow up with your Primary Care Provider. ________________________________________________________  The  GI providers would like to encourage you to use [facility] to communicate with providers for non-urgent requests or questions.  Due to long hold times on the telephone, sending your provider a message by [facility] may be a faster and more efficient way to get a response.  Please allow 48 business hours for a response.  Please remember that this is for non-urgent requests.  _______________________________________________________  Please take protonix '40mg'$  every morning before breakfast meal.  We have sent the following medications to your pharmacy for you to pick up at your convenience:  START: famotidine '20mg'$  one tablet at bedtime as needed.  DISCONTINUE: Carafate  You have been scheduled for an endoscopy. Please follow written instructions given to you at your visit today. If you use inhalers (even only as needed), please bring them with you on the day of your procedure.  Gastroesophageal Reflux Disease, Adult Gastroesophageal reflux (GER) happens when acid from the stomach flows up into the tube that connects the mouth and the stomach (esophagus). Normally, food travels down the esophagus and stays in the stomach to be digested. However, when a person has GER, food and stomach acid sometimes move back up into the esophagus. If this becomes a more serious problem, the person may be diagnosed with a disease called gastroesophageal reflux disease (GERD). GERD occurs when the reflux: Happens often. Causes frequent or severe symptoms. Causes problems such as damage to the esophagus. When stomach acid comes in contact with the esophagus, the acid may cause  inflammation in the esophagus. Over time, GERD may create small holes (ulcers) in the lining of the esophagus. What are the causes? This condition is caused by a problem with the muscle between the esophagus and the stomach (lower esophageal sphincter, or LES). Normally, the LES muscle closes after food passes through the esophagus to the stomach. When the LES is weakened or abnormal, it does not close properly, and that allows food and stomach acid to go back up into the esophagus. The LES can be weakened by certain dietary substances, medicines, and medical conditions, including: Tobacco use. Pregnancy. Having a hiatal hernia. Alcohol use. Certain foods and beverages, such as coffee, chocolate, onions, and peppermint. What increases the risk? You are more likely to develop this condition if you: Have an increased body weight. Have a connective tissue disorder. Take NSAIDs, such as ibuprofen. What are the signs or symptoms? Symptoms of this condition include: Heartburn. Difficult or painful swallowing and the feeling of having a lump in the throat. A bitter taste in the mouth. Bad breath and having a large amount of saliva. Having an upset or bloated stomach and belching. Chest pain. Different conditions can cause chest pain. Make sure you see your health care provider if you experience chest pain. Shortness of breath or wheezing. Ongoing (chronic) cough or a nighttime cough. Wearing away of tooth enamel. Weight loss. How is this diagnosed? This condition may be diagnosed based on a medical history and a physical exam. To determine if you have mild or severe GERD, your health care provider may also monitor how you respond to treatment. You may also have tests, including: A test to examine your stomach and esophagus with a small camera (endoscopy). A test that  measures the acidity level in your esophagus. A test that measures how much pressure is on your esophagus. A barium swallow or  modified barium swallow test to show the shape, size, and functioning of your esophagus. How is this treated? Treatment for this condition may vary depending on how severe your symptoms are. Your health care provider may recommend: Changes to your diet. Medicine. Surgery. The goal of treatment is to help relieve your symptoms and to prevent complications. Follow these instructions at home: Eating and drinking  Follow a diet as recommended by your health care provider. This may involve avoiding foods and drinks such as: Coffee and tea, with or without caffeine. Drinks that contain alcohol. Energy drinks and sports drinks. Carbonated drinks or sodas. Chocolate and cocoa. Peppermint and mint flavorings. Garlic and onions. Horseradish. Spicy and acidic foods, including peppers, chili powder, curry powder, vinegar, hot sauces, and barbecue sauce. Citrus fruit juices and citrus fruits, such as oranges, lemons, and limes. Tomato-based foods, such as red sauce, chili, salsa, and pizza with red sauce. Fried and fatty foods, such as donuts, french fries, potato chips, and high-fat dressings. High-fat meats, such as hot dogs and fatty cuts of red and white meats, such as rib eye steak, sausage, ham, and bacon. High-fat dairy items, such as whole milk, butter, and cream cheese. Eat small, frequent meals instead of large meals. Avoid drinking large amounts of liquid with your meals. Avoid eating meals during the 2-3 hours before bedtime. Avoid lying down right after you eat. Do not exercise right after you eat. Lifestyle  Do not use any products that contain nicotine or tobacco. These products include cigarettes, chewing tobacco, and vaping devices, such as e-cigarettes. If you need help quitting, ask your health care provider. Try to reduce your stress by using methods such as yoga or meditation. If you need help reducing stress, ask your health care provider. If you are overweight, reduce your  weight to an amount that is healthy for you. Ask your health care provider for guidance about a safe weight loss goal. General instructions Pay attention to any changes in your symptoms. Take over-the-counter and prescription medicines only as told by your health care provider. Do not take aspirin, ibuprofen, or other NSAIDs unless your health care provider told you to take these medicines. Wear loose-fitting clothing. Do not wear anything tight around your waist that causes pressure on your abdomen. Raise (elevate) the head of your bed about 6 inches (15 cm). You can use a wedge to do this. Avoid bending over if this makes your symptoms worse. Keep all follow-up visits. This is important. Contact a health care provider if: You have: New symptoms. Unexplained weight loss. Difficulty swallowing or it hurts to swallow. Wheezing or a persistent cough. A hoarse voice. Your symptoms do not improve with treatment. Get help right away if: You have sudden pain in your arms, neck, jaw, teeth, or back. You suddenly feel sweaty, dizzy, or light-headed. You have chest pain or shortness of breath. You vomit and the vomit is green, yellow, or black, or it looks like blood or coffee grounds. You faint. You have stool that is red, bloody, or black. You cannot swallow, drink, or eat. These symptoms may represent a serious problem that is an emergency. Do not wait to see if the symptoms will go away. Get medical help right away. Call your local emergency services (911 in the [place] Do not drive yourself to the hospital. Summary Gastroesophageal reflux happens when  acid from the stomach flows up into the esophagus. GERD is a disease in which the reflux happens often, causes frequent or severe symptoms, or causes problems such as damage to the esophagus. Treatment for this condition may vary depending on how severe your symptoms are. Your health care provider may recommend diet and lifestyle changes,  medicine, or surgery. Contact a health care provider if you have new or worsening symptoms. Take over-the-counter and prescription medicines only as told by your health care provider. Do not take aspirin, ibuprofen, or other NSAIDs unless your health care provider told you to do so. Keep all follow-up visits as told by your health care provider. This is important. This information is not intended to replace advice given to you by your health care provider. Make sure you discuss any questions you have with your health care provider. Document Revised: 04/15/2020 Document Reviewed: 04/15/2020 Elsevier Patient Education  [employer].   Due to recent changes in healthcare laws, you may see the results of your imaging and laboratory studies on MyChart before your provider has had a chance to review them.  We understand that in some cases there may be results that are confusing or concerning to you. Not all laboratory results come back in the same time frame and the provider may be waiting for multiple results in order to interpret others.  Please give [place] 48 hours in order for your provider to thoroughly review all the results before contacting the office for clarification of your results.   Thank you for entrusting me with your care and choosing [facility].  Dr Hilarie Fredrickson

## 2022-10-23 ENCOUNTER — Ambulatory Visit (AMBULATORY_SURGERY_CENTER): Payer: Medicare HMO | Admitting: Internal Medicine

## 2022-10-23 ENCOUNTER — Encounter: Payer: Self-pay | Admitting: Internal Medicine

## 2022-10-23 VITALS — BP 133/66 | HR 50 | Temp 97.8°F | Resp 13 | Ht 70.0 in | Wt 181.0 lb

## 2022-10-23 DIAGNOSIS — K219 Gastro-esophageal reflux disease without esophagitis: Secondary | ICD-10-CM | POA: Diagnosis not present

## 2022-10-23 DIAGNOSIS — K2100 Gastro-esophageal reflux disease with esophagitis, without bleeding: Secondary | ICD-10-CM | POA: Diagnosis not present

## 2022-10-23 DIAGNOSIS — K200 Eosinophilic esophagitis: Secondary | ICD-10-CM | POA: Diagnosis not present

## 2022-10-23 DIAGNOSIS — K209 Esophagitis, unspecified without bleeding: Secondary | ICD-10-CM | POA: Diagnosis not present

## 2022-10-23 DIAGNOSIS — K449 Diaphragmatic hernia without obstruction or gangrene: Secondary | ICD-10-CM | POA: Diagnosis not present

## 2022-10-23 DIAGNOSIS — K2270 Barrett's esophagus without dysplasia: Secondary | ICD-10-CM | POA: Diagnosis not present

## 2022-10-23 MED ORDER — SODIUM CHLORIDE 0.9 % IV SOLN: 500.0000 mL | Freq: Once | INTRAVENOUS | Status: DC

## 2022-10-23 MED ORDER — PANTOPRAZOLE SODIUM 40 MG PO TBEC: 40.0000 mg | DELAYED_RELEASE_TABLET | Freq: Two times a day (BID) | ORAL | 1 refills | Status: DC

## 2022-10-23 NOTE — Progress Notes (Signed)
Called to room to assist during endoscopic procedure.  Patient ID and intended procedure confirmed with present staff. Received instructions for my participation in the procedure from the performing physician.  

## 2022-10-23 NOTE — Progress Notes (Signed)
VS completed by DT.  Pt's states no medical or surgical changes since previsit or office visit.  

## 2022-10-23 NOTE — Patient Instructions (Signed)
Resume previous diet. - Continue present medications. Increase pantoprazole to 40 mg BID-AC. - Await pathology results. - Office followup in March or April to ensure improvement.  YOU HAD AN ENDOSCOPIC PROCEDURE TODAY: Refer to the procedure report and other information in the discharge instructions given to you for any specific questions about what was found during the examination. If this information does not answer your questions, please call [place] office at [phone] to clarify.   YOU SHOULD EXPECT: Some feelings of bloating in the abdomen. Passage of more gas than usual. Walking can help get rid of the air that was put into your GI tract during the procedure and reduce the bloating. If you had a lower endoscopy (such as a colonoscopy or flexible sigmoidoscopy) you may notice spotting of blood in your stool or on the toilet paper. Some abdominal soreness may be present for a day or two, also.  DIET: Your first meal following the procedure should be a light meal and then it is ok to progress to your normal diet. A half-sandwich or bowl of soup is an example of a good first meal. Heavy or fried foods are harder to digest and may make you feel nauseous or bloated. Drink plenty of fluids but you should avoid alcoholic beverages for 24 hours. If you had a esophageal dilation, please see attached instructions for diet.    ACTIVITY: Your care partner should take you home directly after the procedure. You should plan to take it easy, moving slowly for the rest of the day. You can resume normal activity the day after the procedure however YOU SHOULD NOT DRIVE, use power tools, machinery or perform tasks that involve climbing or major physical exertion for 24 hours (because of the sedation medicines used during the test).   SYMPTOMS TO REPORT IMMEDIATELY: A gastroenterologist can be reached at any hour. Please call [phone]  for any of the following symptoms:  Following upper endoscopy (EGD, EUS,  ERCP, esophageal dilation) Vomiting of blood or coffee ground material  New, significant abdominal pain  New, significant chest pain or pain under the shoulder blades  Painful or persistently difficult swallowing  New shortness of breath  Black, tarry-looking or red, bloody stools  FOLLOW UP:  If any biopsies were taken you will be contacted by phone or by letter within the next 1-3 weeks. Call [phone]  if you have not heard about the biopsies in 3 weeks.  Please also call with any specific questions about appointments or follow up tests.

## 2022-10-23 NOTE — Progress Notes (Signed)
Report to pacu rn. Vss. Care resumed by rn. 

## 2022-10-23 NOTE — Op Note (Signed)
[place] Patient Name: Timothy Singleton Procedure Date: 10/23/2022 1:23 PM MRN: [id number] Endoscopist: Jerene Bears , MD, [id number] Age: 77 Referring MD:  Date of Birth: 02/01/1946 Gender: Male Account #: [id number] Procedure:                Upper GI endoscopy Indications:              Heartburn, Gastro-esophageal reflux disease despite                            pantoprazole 40 mg daily Medicines:                Monitored Anesthesia Care Procedure:                Pre-Anesthesia Assessment:                           - Prior to the procedure, a History and Physical                            was performed, and patient medications and                            allergies were reviewed. The patient's tolerance of                            previous anesthesia was also reviewed. The risks                            and benefits of the procedure and the sedation                            options and risks were discussed with the patient.                            All questions were answered, and informed consent                            was obtained. Prior Anticoagulants: The patient has                            taken no anticoagulant or antiplatelet agents. ASA                            Grade Assessment: II - A patient with mild systemic                            disease. After reviewing the risks and benefits,                            the patient was deemed in satisfactory condition to                            undergo the procedure.  After obtaining informed consent, the endoscope was                            passed under direct vision. Throughout the                            procedure, the patient's blood pressure, pulse, and                            oxygen saturations were monitored continuously. The                            GIF [id number] #[id number] was introduced through the                            mouth, and advanced to the second  part of duodenum.                            The upper GI endoscopy was accomplished without                            difficulty. The patient tolerated the procedure                            well. Scope In: Scope Out: Findings:                 Diffuse mild inflammation characterized by                            congestion (edema) and granularity was found in the                            entire esophagus. There were small white specks and                            mild furrowing. Biopsies were obtained from the                            proximal and distal esophagus with cold forceps for                            histology and to evaluate for reflux versus                            eosinophilic esophagitis.                           The Z-line was irregular and was found 38 cm from                            the incisors. Biopsies were taken with a cold                            forceps  for histology to exclude Barrett's                            esophagus.                           A 2 cm hiatal hernia was present.                           The gastroesophageal flap valve was visualized                            endoscopically and classified as Hill Grade III                            (minimal fold, loose to endoscope, hiatal hernia                            likely).                           The entire examined stomach was normal.                           The examined duodenum was normal. Complications:            No immediate complications. Estimated Blood Loss:     Estimated blood loss was minimal. Impression:               - Esophageal mucosal changes were present,                            including congestion (edema), linear furrows and                            granularity. Findings are suggestive of reflux                            inflammation versus EoE.                           - Z-line irregular, 38 cm from the incisors.                             Biopsied to exclude Barrett's esophagus.                           - 2 cm hiatal hernia.                           - Normal stomach.                           - Normal examined duodenum.                           - Biopsies were taken with a cold forceps for  evaluation of eosinophilic esophagitis. Recommendation:           - Patient has a contact number available for                            emergencies. The signs and symptoms of potential                            delayed complications were discussed with the                            patient. Return to normal activities tomorrow.                            Written discharge instructions were provided to the                            patient.                           - Resume previous diet.                           - Continue present medications. Increase                            pantoprazole to 40 mg BID-AC.                           - Await pathology results.                           - Office followup in March or April to ensure                            improvement. Jerene Bears, MD 10/23/2022 1:40:33 PM This report has been signed electronically.

## 2022-10-23 NOTE — Progress Notes (Signed)
See office note dated 10/22/2022 for details and current H&P  Patient presenting for upper endoscopy to evaluate GERD and heartburn  He remains appropriate for upper endoscopy here today.

## 2022-10-26 ENCOUNTER — Telehealth: Payer: Self-pay | Admitting: *Deleted

## 2022-10-26 NOTE — Telephone Encounter (Signed)
  Follow up Call-     10/23/2022   12:47 PM  Call back number  Post procedure Call Back phone  # [phone]  Permission to leave phone message Yes     Patient questions:  Do you have a fever, pain , or abdominal swelling? No. Pain Score  0 *  Have you tolerated food without any problems? Yes.    Have you been able to return to your normal activities? Yes.    Do you have any questions about your discharge instructions: Diet   No. Medications  No. Follow up visit  No.  Do you have questions or concerns about your Care? No.  Actions: * If pain score is 4 or above: No action needed, pain <4.

## 2022-10-29 DIAGNOSIS — R69 Illness, unspecified: Secondary | ICD-10-CM | POA: Diagnosis not present

## 2022-11-03 DIAGNOSIS — G479 Sleep disorder, unspecified: Secondary | ICD-10-CM | POA: Diagnosis not present

## 2022-11-03 DIAGNOSIS — Z7689 Persons encountering health services in other specified circumstances: Secondary | ICD-10-CM | POA: Diagnosis not present

## 2022-11-03 DIAGNOSIS — E291 Testicular hypofunction: Secondary | ICD-10-CM | POA: Diagnosis not present

## 2022-11-03 DIAGNOSIS — N529 Male erectile dysfunction, unspecified: Secondary | ICD-10-CM | POA: Diagnosis not present

## 2022-11-04 ENCOUNTER — Other Ambulatory Visit: Payer: Self-pay

## 2022-11-04 DIAGNOSIS — K209 Esophagitis, unspecified without bleeding: Secondary | ICD-10-CM

## 2022-11-09 DIAGNOSIS — N529 Male erectile dysfunction, unspecified: Secondary | ICD-10-CM | POA: Diagnosis not present

## 2022-11-09 DIAGNOSIS — G479 Sleep disorder, unspecified: Secondary | ICD-10-CM | POA: Diagnosis not present

## 2022-11-09 DIAGNOSIS — E291 Testicular hypofunction: Secondary | ICD-10-CM | POA: Diagnosis not present

## 2022-11-09 DIAGNOSIS — Z6825 Body mass index (BMI) 25.0-25.9, adult: Secondary | ICD-10-CM | POA: Diagnosis not present

## 2022-12-01 DIAGNOSIS — E291 Testicular hypofunction: Secondary | ICD-10-CM | POA: Diagnosis not present

## 2022-12-07 DIAGNOSIS — E291 Testicular hypofunction: Secondary | ICD-10-CM | POA: Diagnosis not present

## 2022-12-07 DIAGNOSIS — G479 Sleep disorder, unspecified: Secondary | ICD-10-CM | POA: Diagnosis not present

## 2022-12-07 DIAGNOSIS — N529 Male erectile dysfunction, unspecified: Secondary | ICD-10-CM | POA: Diagnosis not present

## 2022-12-07 DIAGNOSIS — Z6824 Body mass index (BMI) 24.0-24.9, adult: Secondary | ICD-10-CM | POA: Diagnosis not present

## 2022-12-08 ENCOUNTER — Encounter: Payer: Self-pay | Admitting: Internal Medicine

## 2022-12-31 ENCOUNTER — Other Ambulatory Visit (INDEPENDENT_AMBULATORY_CARE_PROVIDER_SITE_OTHER): Payer: Self-pay | Admitting: Vascular Surgery

## 2022-12-31 DIAGNOSIS — I6523 Occlusion and stenosis of bilateral carotid arteries: Secondary | ICD-10-CM

## 2023-01-01 ENCOUNTER — Ambulatory Visit (INDEPENDENT_AMBULATORY_CARE_PROVIDER_SITE_OTHER): Payer: Medicare HMO | Admitting: Vascular Surgery

## 2023-01-01 ENCOUNTER — Ambulatory Visit (INDEPENDENT_AMBULATORY_CARE_PROVIDER_SITE_OTHER): Payer: Medicare HMO

## 2023-01-01 ENCOUNTER — Encounter (INDEPENDENT_AMBULATORY_CARE_PROVIDER_SITE_OTHER): Payer: Self-pay | Admitting: Vascular Surgery

## 2023-01-01 VITALS — BP 155/75 | HR 58 | Resp 18 | Ht 70.0 in | Wt 175.6 lb

## 2023-01-01 DIAGNOSIS — I2510 Atherosclerotic heart disease of native coronary artery without angina pectoris: Secondary | ICD-10-CM | POA: Diagnosis not present

## 2023-01-01 DIAGNOSIS — E782 Mixed hyperlipidemia: Secondary | ICD-10-CM

## 2023-01-01 DIAGNOSIS — I6523 Occlusion and stenosis of bilateral carotid arteries: Secondary | ICD-10-CM | POA: Diagnosis not present

## 2023-01-01 DIAGNOSIS — I10 Essential (primary) hypertension: Secondary | ICD-10-CM | POA: Diagnosis not present

## 2023-01-01 NOTE — Assessment & Plan Note (Signed)
Carotid duplex today shows stable carotid artery disease with stenosis that appears to be less than 50% bilaterally in the common and internal carotid arteries.  This is better than previous CT scans performed in years past which is suggested at least 50 to 60% stenosis.  He will continue his current medical regimen and we will continue to follow this with duplex.  No role for intervention at this level.

## 2023-01-01 NOTE — Progress Notes (Signed)
MRN : [id number]  Timothy Singleton is a 77 y.o. (Feb 13, 1946) male who presents with chief complaint of  Chief Complaint  Patient presents with   Follow-up    f/u in 6 months with carotid  .  History of Present Illness: Patient returns in follow-up of his carotid disease.  He is doing well today without any focal neurologic symptoms. Specifically, the patient denies amaurosis fugax, speech or swallowing difficulties, or arm or leg weakness or numbness.  Carotid duplex today shows stable carotid artery disease with stenosis that appears to be less than 50% bilaterally in the common and internal carotid arteries.  This is better than previous CT scans performed in years past which is suggested at least 50 to 60% stenosis.  Current Outpatient Medications  Medication Sig Dispense Refill   aspirin 81 MG tablet Take 81 mg by mouth daily.     atorvastatin (LIPITOR) 80 MG tablet atorvastatin 80 mg tablet  TAKE 1 TABLET BY MOUTH EVERY DAY     carbamazepine (TEGRETOL XR) 200 MG 12 hr tablet Take 1 tablet (200 mg total) by mouth 2 (two) times daily. 180 tablet 1   Cholecalciferol (EQL VITAMIN D3) 50 MCG (2000 UT) CAPS      famotidine (PEPCID) 20 MG tablet Take 1 tablet (20 mg total) by mouth at bedtime as needed for heartburn or indigestion. 30 tablet 3   pantoprazole (PROTONIX) 40 MG tablet Take 1 tablet (40 mg total) by mouth 2 (two) times daily before a meal. 90 tablet 1   sertraline (ZOLOFT) 100 MG tablet TAKE 1 TABLET BY MOUTH EVERY DAY 90 tablet 3   tamsulosin (FLOMAX) 0.4 MG CAPS capsule TAKE 1 CAPSULE BY MOUTH EVERY DAY 90 capsule 1   triamterene-hydrochlorothiazide (DYAZIDE) 37.5-25 MG capsule Take 1 each (1 capsule total) by mouth daily. 90 capsule 3   No current facility-administered medications for this visit.    Past Medical History:  Diagnosis Date   Arthritis    Cancer ([place])    melanoma / knee   Depression    Diverticulosis    GERD (gastroesophageal reflux disease)     Hyperlipemia    Hypertension    Internal hemorrhoids    Past heart attack    Sleep apnea    Trigeminal neuralgia     Past Surgical History:  Procedure Laterality Date   BUNIONECTOMY     COLONOSCOPY WITH PROPOFOL N/A 11/17/2016   Procedure: COLONOSCOPY WITH PROPOFOL;  Surgeon: Jonathon Bellows, MD;  Location: [facility];  Service: Endoscopy;  Laterality: N/A;   CORONARY STENT PLACEMENT     EXTRACORPOREAL SHOCK WAVE LITHOTRIPSY     EXTRACORPOREAL SHOCK WAVE LITHOTRIPSY Right 12/30/2017   Procedure: EXTRACORPOREAL SHOCK WAVE LITHOTRIPSY (ESWL);  Surgeon: Hollice Espy, MD;  Location: [facility] ORS;  Service: Urology;  Laterality: Right;   EXTRACORPOREAL SHOCK WAVE LITHOTRIPSY Right 06/20/2020   Procedure: EXTRACORPOREAL SHOCK WAVE LITHOTRIPSY (ESWL);  Surgeon: Hollice Espy, MD;  Location: [facility] ORS;  Service: Urology;  Laterality: Right;   HERNIA REPAIR     inguinal-right   JOINT REPLACEMENT     total shoulder replacement   KNEE SURGERY Right    TONSILLECTOMY     TOTAL SHOULDER REPLACEMENT     UPPER GASTROINTESTINAL ENDOSCOPY     UPPER GI ENDOSCOPY  10/18/2001   hiatus hernia   VASECTOMY     WRIST SURGERY       Social History   Tobacco Use   Smoking status: Former   Smokeless  tobacco: Former   Tobacco comments:    quit 40 years ago   Vaping Use   Vaping Use: Never used  Substance Use Topics   Alcohol use: Yes    Alcohol/week: 0.0 standard drinks of alcohol    Comment: 2-4 xs per month / beer or mixed drink   Drug use: No      Family History  Problem Relation Age of Onset   Cancer Mother    Dementia Mother    Stroke Father    Heart disease Father    Hypertension Father    Breast cancer Sister    Parkinson's disease Brother    Hypertension Brother    Melanoma Maternal Grandmother    Bladder Cancer Neg Hx    Prostate cancer Neg Hx    Kidney cancer Neg Hx      No Known Allergies  REVIEW OF SYSTEMS (Negative unless checked)   Constitutional: [] Weight loss   [] Fever  [] Chills Cardiac: [] Chest pain   [] Chest pressure   [] Palpitations   [] Shortness of breath when laying flat   [] Shortness of breath at rest   [x] Shortness of breath with exertion. Vascular:  [] Pain in legs with walking   [] Pain in legs at rest   [] Pain in legs when laying flat   [] Claudication   [] Pain in feet when walking  [] Pain in feet at rest  [] Pain in feet when laying flat   [] History of DVT   [] Phlebitis   [] Swelling in legs   [] Varicose veins   [] Non-healing ulcers Pulmonary:   [] Uses home oxygen   [] Productive cough   [] Hemoptysis   [] Wheeze  [] COPD   [] Asthma Neurologic:  [x] Dizziness  [] Blackouts   [] Seizures   [] History of stroke   [] History of TIA  [x] Aphasia   [] Temporary blindness   [] Dysphagia   [] Weakness or numbness in arms   [] Weakness or numbness in legs Musculoskeletal:  [x] Arthritis   [] Joint swelling   [] Joint pain   [] Low back pain Hematologic:  [] Easy bruising  [] Easy bleeding   [] Hypercoagulable state   [] Anemic  [] Hepatitis Gastrointestinal:  [] Blood in stool   [] Vomiting blood  [x] Gastroesophageal reflux/heartburn   [] Abdominal pain Genitourinary:  [] Chronic kidney disease   [] Difficult urination  [] Frequent urination  [] Burning with urination   [] Hematuria Skin:  [] Rashes   [] Ulcers   [] Wounds Psychological:  [] History of anxiety   []  History of major depression.  Physical Examination  Vitals:   01/01/23 0927  BP: (!) 155/75  Pulse: (!) 58  Resp: 18  Weight: 175 lb 9.6 oz (79.7 kg)  Height: 5\' 10"  (1.778 m)   Body mass index is 25.2 kg/m. Gen:  WD/WN, NAD. Appears young than stated age. Head: [place]/AT, No temporalis wasting. Ear/Nose/Throat: Hearing grossly intact, nares w/o erythema or drainage, trachea midline Eyes: Conjunctiva clear. Sclera non-icteric Neck: Supple.  No bruit  Pulmonary:  Good air movement, equal and clear to auscultation bilaterally.  Cardiac: RRR, No JVD Vascular:  Vessel Right Left  Radial Palpable Palpable            Musculoskeletal: M/S 5/5 throughout.  No deformity or atrophy. No edema. Neurologic: CN 2-12 intact. Sensation grossly intact in extremities.  Symmetrical.  Speech is fluent. Motor exam as listed above. Psychiatric: Judgment intact, Mood & affect appropriate for pt's clinical situation. Dermatologic: No rashes or ulcers noted.  No cellulitis or open wounds.    CBC Lab Results  Component Value Date   WBC 4.6 12/31/2021   HGB  14.1 12/31/2021   HCT 41.8 12/31/2021   MCV 92 12/31/2021   PLT 214 12/31/2021    BMET    Component Value Date/Time   NA 141 12/31/2021 0812   K 5.0 12/31/2021 0812   CL 103 12/31/2021 0812   CO2 27 12/31/2021 0812   GLUCOSE 101 (H) 12/31/2021 0812   GLUCOSE 110 (H) 05/04/2021 1344   BUN 17 12/31/2021 0812   CREATININE 1.21 12/31/2021 0812   CREATININE 1.18 10/04/2017 0935   CALCIUM 9.7 12/31/2021 0812   GFRNONAA >60 05/04/2021 1344   GFRNONAA 61 10/04/2017 0935   GFRAA 79 07/08/2020 0906   GFRAA 71 10/04/2017 0935   CrCl cannot be calculated (Patient's most recent lab result is older than the maximum 21 days allowed.).  COAG No results found for: "INR", "PROTIME"  Radiology No results found.   Assessment/Plan Bilateral carotid artery stenosis Carotid duplex today shows stable carotid artery disease with stenosis that appears to be less than 50% bilaterally in the common and internal carotid arteries.  This is better than previous CT scans performed in years past which is suggested at least 50 to 60% stenosis.  He will continue his current medical regimen and we will continue to follow this with duplex.  No role for intervention at this level.  Essential (primary) hypertension blood pressure control important in reducing the progression of atherosclerotic disease. On appropriate oral medications.     Combined fat and carbohydrate induced hyperlipemia lipid control important in reducing the progression of atherosclerotic disease. Continue  statin therapy     CAD in native artery If we end up doing surgery, we will ask his cardiologist to assess his surgical risk.  Leotis Pain, MD  01/01/2023 10:22 AM    This note was created with Dragon medical transcription system.  Any errors from dictation are purely unintentional

## 2023-01-04 ENCOUNTER — Ambulatory Visit (AMBULATORY_SURGERY_CENTER): Payer: Medicare HMO | Admitting: Internal Medicine

## 2023-01-04 ENCOUNTER — Encounter: Payer: Self-pay | Admitting: Internal Medicine

## 2023-01-04 VITALS — BP 118/70 | HR 58 | Temp 98.0°F | Resp 15 | Ht 70.0 in | Wt 181.0 lb

## 2023-01-04 DIAGNOSIS — G4730 Sleep apnea, unspecified: Secondary | ICD-10-CM | POA: Diagnosis not present

## 2023-01-04 DIAGNOSIS — R69 Illness, unspecified: Secondary | ICD-10-CM | POA: Diagnosis not present

## 2023-01-04 DIAGNOSIS — I10 Essential (primary) hypertension: Secondary | ICD-10-CM | POA: Diagnosis not present

## 2023-01-04 DIAGNOSIS — K2100 Gastro-esophageal reflux disease with esophagitis, without bleeding: Secondary | ICD-10-CM | POA: Diagnosis not present

## 2023-01-04 DIAGNOSIS — K2970 Gastritis, unspecified, without bleeding: Secondary | ICD-10-CM

## 2023-01-04 DIAGNOSIS — K209 Esophagitis, unspecified without bleeding: Secondary | ICD-10-CM

## 2023-01-04 DIAGNOSIS — B3781 Candidal esophagitis: Secondary | ICD-10-CM

## 2023-01-04 DIAGNOSIS — K219 Gastro-esophageal reflux disease without esophagitis: Secondary | ICD-10-CM

## 2023-01-04 MED ORDER — SODIUM CHLORIDE 0.9 % IV SOLN: 500.0000 mL | Freq: Once | INTRAVENOUS | Status: AC

## 2023-01-04 NOTE — Progress Notes (Signed)
GASTROENTEROLOGY PROCEDURE H&P NOTE   Primary Care Physician: [place]    Reason for Procedure:  Follow-up esophagitis  Plan:    EGD  Patient is appropriate for endoscopic procedure(s) in the ambulatory ([place]) setting.  The nature of the procedure, as well as the risks, benefits, and alternatives were carefully and thoroughly reviewed with the patient. Ample time for discussion and questions allowed. The patient understood, was satisfied, and agreed to proceed.     HPI: Timothy Mcannally is a 77 y.o. male who presents for EGD.  Medical history as below.  No recent chest pain or shortness of breath.  No abdominal pain today.  Past Medical History:  Diagnosis Date   Arthritis    Cancer ([place])    melanoma / knee   Depression    Diverticulosis    GERD (gastroesophageal reflux disease)    Hyperlipemia    Hypertension    Internal hemorrhoids    Past heart attack    Sleep apnea    Trigeminal neuralgia     Past Surgical History:  Procedure Laterality Date   BUNIONECTOMY     COLONOSCOPY WITH PROPOFOL N/A 11/17/2016   Procedure: COLONOSCOPY WITH PROPOFOL;  Surgeon: Jonathon Bellows, MD;  Location: [facility];  Service: Endoscopy;  Laterality: N/A;   CORONARY STENT PLACEMENT     EXTRACORPOREAL SHOCK WAVE LITHOTRIPSY     EXTRACORPOREAL SHOCK WAVE LITHOTRIPSY Right 12/30/2017   Procedure: EXTRACORPOREAL SHOCK WAVE LITHOTRIPSY (ESWL);  Surgeon: Hollice Espy, MD;  Location: [facility];  Service: Urology;  Laterality: Right;   EXTRACORPOREAL SHOCK WAVE LITHOTRIPSY Right 06/20/2020   Procedure: EXTRACORPOREAL SHOCK WAVE LITHOTRIPSY (ESWL);  Surgeon: Hollice Espy, MD;  Location: [facility]S;  Service: Urology;  Laterality: Right;   HERNIA REPAIR     inguinal-right   JOINT REPLACEMENT     total shoulder replacement   KNEE SURGERY Right    TONSILLECTOMY     TOTAL SHOULDER REPLACEMENT     UPPER GASTROINTESTINAL ENDOSCOPY     UPPER GI ENDOSCOPY  10/18/2001    hiatus hernia   VASECTOMY     WRIST SURGERY      Prior to Admission medications   Medication Sig Start Date End Date Taking? Authorizing Provider  aspirin 81 MG tablet Take 81 mg by mouth daily.   Yes [provider]  atorvastatin (LIPITOR) 80 MG tablet atorvastatin 80 mg tablet  TAKE 1 TABLET BY MOUTH EVERY DAY   Yes [provider]  carbamazepine (TEGRETOL XR) 200 MG 12 hr tablet Take 1 tablet (200 mg total) by mouth 2 (two) times daily. 02/10/22  Yes Eulas Post, MD  Cholecalciferol (EQL VITAMIN D3) 50 MCG (2000 UT) CAPS    Yes [provider]  famotidine (PEPCID) 20 MG tablet Take 1 tablet (20 mg total) by mouth at bedtime as needed for heartburn or indigestion. 10/22/22  Yes Ashlyn Cabler, Lajuan Lines, MD  pantoprazole (PROTONIX) 40 MG tablet Take 1 tablet (40 mg total) by mouth 2 (two) times daily before a meal. 10/23/22  Yes Tanairi Cypert, Lajuan Lines, MD  sertraline (ZOLOFT) 100 MG tablet TAKE 1 TABLET BY MOUTH EVERY DAY 06/15/22  Yes Eulas Post, MD  triamterene-hydrochlorothiazide (DYAZIDE) 37.5-25 MG capsule Take 1 each (1 capsule total) by mouth daily. 06/10/22  Yes Eulas Post, MD  tamsulosin (FLOMAX) 0.4 MG CAPS capsule TAKE 1 CAPSULE BY MOUTH EVERY DAY 03/17/22   Eulas Post, MD    Current Outpatient Medications  Medication  Sig Dispense Refill   aspirin 81 MG tablet Take 81 mg by mouth daily.     atorvastatin (LIPITOR) 80 MG tablet atorvastatin 80 mg tablet  TAKE 1 TABLET BY MOUTH EVERY DAY     carbamazepine (TEGRETOL XR) 200 MG 12 hr tablet Take 1 tablet (200 mg total) by mouth 2 (two) times daily. 180 tablet 1   Cholecalciferol (EQL VITAMIN D3) 50 MCG (2000 UT) CAPS      famotidine (PEPCID) 20 MG tablet Take 1 tablet (20 mg total) by mouth at bedtime as needed for heartburn or indigestion. 30 tablet 3   pantoprazole (PROTONIX) 40 MG tablet Take 1 tablet (40 mg total) by mouth 2 (two) times daily before a meal. 90 tablet 1   sertraline (ZOLOFT) 100 MG  tablet TAKE 1 TABLET BY MOUTH EVERY DAY 90 tablet 3   triamterene-hydrochlorothiazide (DYAZIDE) 37.5-25 MG capsule Take 1 each (1 capsule total) by mouth daily. 90 capsule 3   tamsulosin (FLOMAX) 0.4 MG CAPS capsule TAKE 1 CAPSULE BY MOUTH EVERY DAY 90 capsule 1   Current Facility-Administered Medications  Medication Dose Route Frequency Provider Last Rate Last Admin   0.9 %  sodium chloride infusion  500 mL Intravenous Once Philander Ake, Lajuan Lines, MD        Allergies as of 01/04/2023   (No Known Allergies)    Family History  Problem Relation Age of Onset   Cancer Mother    Dementia Mother    Stroke Father    Heart disease Father    Hypertension Father    Breast cancer Sister    Parkinson's disease Brother    Hypertension Brother    Melanoma Maternal Grandmother    Bladder Cancer Neg Hx    Prostate cancer Neg Hx    Kidney cancer Neg Hx    Colon cancer Neg Hx    Rectal cancer Neg Hx    Stomach cancer Neg Hx    Esophageal cancer Neg Hx     Social History   Socioeconomic History   Marital status: Married    Spouse name: Not on file   Number of children: 2   Years of education: Not on file   Highest education level: Bachelor's degree (e.g., BA, AB, BS)  Occupational History   Occupation: retired  Tobacco Use   Smoking status: Former   Smokeless tobacco: Former   Tobacco comments:    quit 40 years ago   Vaping Use   Vaping Use: Never used  Substance and Sexual Activity   Alcohol use: Yes    Alcohol/week: 0.0 standard drinks of alcohol    Comment: 2-4 xs per month / beer or mixed drink   Drug use: No   Sexual activity: Not on file  Other Topics Concern   Not on file  Social History Narrative   Not on file   Social Determinants of Health   Financial Resource Strain: Low Risk  (11/17/2021)   Overall Financial Resource Strain (CARDIA)    Difficulty of Paying Living Expenses: Not hard at all  Food Insecurity: No Food Insecurity (11/17/2021)   Hunger Vital Sign     Worried About Running Out of Food in the Last Year: Never true    Ran Out of Food in the Last Year: Never true  Transportation Needs: No Transportation Needs (11/17/2021)   PRAPARE - Hydrologist (Medical): No    Lack of Transportation (Non-Medical): No  Physical Activity: Insufficiently Active (11/17/2021)  Exercise Vital Sign    Days of Exercise per Week: 3 days    Minutes of Exercise per Session: 40 min  Stress: No Stress Concern Present (11/17/2021)   [place]    Feeling of Stress : Not at all  Social Connections: Moderately Integrated (11/17/2021)   Social Connection and Isolation Panel [NHANES]    Frequency of Communication with Friends and Family: More than three times a week    Frequency of Social Gatherings with Friends and Family: Twice a week    Attends Religious Services: More than 4 times per year    Active Member of [employer] or Organizations: No    Attends Archivist Meetings: Never    Marital Status: Married  Human resources officer Violence: Not At Risk (11/17/2021)   Humiliation, Afraid, Rape, and Kick questionnaire    Fear of Current or Ex-Partner: No    Emotionally Abused: No    Physically Abused: No    Sexually Abused: No    Physical Exam: Vital signs in last 24 hours: @BP  (!) 124/54   Pulse 60   Temp 98 F (36.7 C)   Ht 5\' 10"  (1.778 m)   Wt 181 lb (82.1 kg)   SpO2 98%   BMI 25.97 kg/m  GEN: NAD EYE: Sclerae anicteric ENT: MMM CV: Non-tachycardic Pulm: CTA b/l GI: Soft, NT/ND NEURO:  Alert & Oriented x 3   Zenovia Jarred, MD [facility]  01/04/2023 10:27 AM

## 2023-01-04 NOTE — Progress Notes (Signed)
Called to room to assist during endoscopic procedure.  Patient ID and intended procedure confirmed with present staff. Received instructions for my participation in the procedure from the performing physician.  

## 2023-01-04 NOTE — Progress Notes (Signed)
To pacu, VSS. Report to [email or web address] 

## 2023-01-04 NOTE — Op Note (Signed)
[facility] Patient Name: Timothy Singleton Procedure Date: 01/04/2023 10:28 AM MRN: [id number] Endoscopist: Jerene Bears , MD, [id number] Age: 77 Referring MD:  Date of Birth: 10/05/1946 Gender: Male Account #: [id number] Procedure:                Upper GI endoscopy Indications:              Follow-up of gastro-esophageal reflux disease seen                            at EGD on 10/23/22 (distal and proximal esophagitis                            with elevated eosinophils -- done on once daily                            PPI, now BID PPI since last EGD with improvement in                            symptoms); non-dysplastic short segment Barrett's                            found at last EGD. Medicines:                Monitored Anesthesia Care Procedure:                Pre-Anesthesia Assessment:                           - Prior to the procedure, a History and Physical                            was performed, and patient medications and                            allergies were reviewed. The patient's tolerance of                            previous anesthesia was also reviewed. The risks                            and benefits of the procedure and the sedation                            options and risks were discussed with the patient.                            All questions were answered, and informed consent                            was obtained. Prior Anticoagulants: The patient has                            taken no anticoagulant or antiplatelet agents. ASA  Grade Assessment: II - A patient with mild systemic                            disease. After reviewing the risks and benefits,                            the patient was deemed in satisfactory condition to                            undergo the procedure.                           After obtaining informed consent, the endoscope was                            passed under direct vision.  Throughout the                            procedure, the patient's blood pressure, pulse, and                            oxygen saturations were monitored continuously. The                            GIF [id number] was introduced through the                            mouth, and advanced to the second part of duodenum.                            The upper GI endoscopy was accomplished without                            difficulty. The patient tolerated the procedure                            well. Scope In: Scope Out: Findings:                 Mucosal changes including longitudinal furrows and                            punctate white spots were found in the entire                            esophagus. While improved from last exam changes                            still visible. Biopsies were obtained from the                            proximal and distal esophagus with cold forceps for                            histology  of suspected eosinophilic esophagitis.                           Barrett's esophagus was present at the                            gastroesophageal junction. The maximum longitudinal                            extent of these mucosal changes was 1 cm in length.                           A 2 cm hiatal hernia was present.                           The entire examined stomach was normal.                           The examined duodenum was normal. Complications:            No immediate complications. Estimated Blood Loss:     Estimated blood loss was minimal. 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 Improved but still                            visible.                           - Barrett's esophagus. Stable.                           - 2 cm hiatal hernia.                           - Normal stomach.                           - Normal examined duodenum.                           -  Biopsies were taken with a cold forceps for                            evaluation of eosinophilic esophagitis. Recommendation:           - Patient has a contact number available for                            emergencies. The signs and symptoms of potential                            delayed complications were discussed with the  patient. Return to normal activities tomorrow.                            Written discharge instructions were provided to the                            patient.                           - Resume previous diet.                           - Continue present medications.                           - Await pathology results. Jerene Bears, MD 01/04/2023 10:52:05 AM This report has been signed electronically.

## 2023-01-04 NOTE — Patient Instructions (Signed)
Information on hiatal hernia given to you today.  Await pathology results from the biopsies taken today.  Resume previous diet and medications.    YOU HAD AN ENDOSCOPIC PROCEDURE TODAY AT [facility]:   Refer to the procedure report that was given to you for any specific questions about what was found during the examination.  If the procedure report does not answer your questions, please call your gastroenterologist to clarify.  If you requested that your care partner not be given the details of your procedure findings, then the procedure report has been included in a sealed envelope for you to review at your convenience later.  YOU SHOULD EXPECT: Some feelings of bloating in the abdomen. Passage of more gas than usual.  Walking can help get rid of the air that was put into your GI tract during the procedure and reduce the bloating. If you had a lower endoscopy (such as a colonoscopy or flexible sigmoidoscopy) you may notice spotting of blood in your stool or on the toilet paper. If you underwent a bowel prep for your procedure, you may not have a normal bowel movement for a few days.  Please Note:  You might notice some irritation and congestion in your nose or some drainage.  This is from the oxygen used during your procedure.  There is no need for concern and it should clear up in a day or so.  SYMPTOMS TO REPORT IMMEDIATELY:   Following upper endoscopy (EGD)  Vomiting of blood or coffee ground material  New chest pain or pain under the shoulder blades  Painful or persistently difficult swallowing  New shortness of breath  Fever of 100F or higher  Black, tarry-looking stools  For urgent or emergent issues, a gastroenterologist can be reached at any hour by calling [phone]. Do not use MyChart messaging for urgent concerns.    DIET:  We do recommend a small meal at first, but then you may proceed to your regular diet.  Drink plenty of fluids but you should avoid  alcoholic beverages for 24 hours.  ACTIVITY:  You should plan to take it easy for the rest of today and you should NOT DRIVE or use heavy machinery until tomorrow (because of the sedation medicines used during the test).    FOLLOW UP: Our staff will call the number listed on your records the next business day following your procedure.  We will call around 7:15- 8:00 am to check on you and address any questions or concerns that you may have regarding the information given to you following your procedure. If we do not reach you, we will leave a message.     If any biopsies were taken you will be contacted by phone or by letter within the next 1-3 weeks.  Please call us at [phone] if you have not heard about the biopsies in 3 weeks.    SIGNATURES/CONFIDENTIALITY: You and/or your care partner have signed paperwork which will be entered into your electronic medical record.  These signatures attest to the fact that that the information above on your After Visit Summary has been reviewed and is understood.  Full responsibility of the confidentiality of this discharge information lies with you and/or your care-partner.

## 2023-01-05 ENCOUNTER — Telehealth: Payer: Self-pay

## 2023-01-05 NOTE — Telephone Encounter (Signed)
Follow up call to pt, no answer.  

## 2023-01-07 DIAGNOSIS — D2272 Melanocytic nevi of left lower limb, including hip: Secondary | ICD-10-CM | POA: Diagnosis not present

## 2023-01-07 DIAGNOSIS — C44729 Squamous cell carcinoma of skin of left lower limb, including hip: Secondary | ICD-10-CM | POA: Diagnosis not present

## 2023-01-07 DIAGNOSIS — K2270 Barrett's esophagus without dysplasia: Secondary | ICD-10-CM | POA: Diagnosis not present

## 2023-01-07 DIAGNOSIS — Z85828 Personal history of other malignant neoplasm of skin: Secondary | ICD-10-CM | POA: Diagnosis not present

## 2023-01-07 DIAGNOSIS — Z85820 Personal history of malignant melanoma of skin: Secondary | ICD-10-CM | POA: Diagnosis not present

## 2023-01-07 DIAGNOSIS — X32XXXA Exposure to sunlight, initial encounter: Secondary | ICD-10-CM | POA: Diagnosis not present

## 2023-01-07 DIAGNOSIS — D2262 Melanocytic nevi of left upper limb, including shoulder: Secondary | ICD-10-CM | POA: Diagnosis not present

## 2023-01-07 DIAGNOSIS — L821 Other seborrheic keratosis: Secondary | ICD-10-CM | POA: Diagnosis not present

## 2023-01-07 DIAGNOSIS — L570 Actinic keratosis: Secondary | ICD-10-CM | POA: Diagnosis not present

## 2023-01-07 DIAGNOSIS — N400 Enlarged prostate without lower urinary tract symptoms: Secondary | ICD-10-CM | POA: Diagnosis not present

## 2023-01-07 DIAGNOSIS — Z23 Encounter for immunization: Secondary | ICD-10-CM | POA: Diagnosis not present

## 2023-01-07 DIAGNOSIS — I2510 Atherosclerotic heart disease of native coronary artery without angina pectoris: Secondary | ICD-10-CM | POA: Diagnosis not present

## 2023-01-07 DIAGNOSIS — C44719 Basal cell carcinoma of skin of left lower limb, including hip: Secondary | ICD-10-CM | POA: Diagnosis not present

## 2023-01-07 DIAGNOSIS — I10 Essential (primary) hypertension: Secondary | ICD-10-CM | POA: Diagnosis not present

## 2023-01-07 DIAGNOSIS — E782 Mixed hyperlipidemia: Secondary | ICD-10-CM | POA: Diagnosis not present

## 2023-01-07 DIAGNOSIS — G4733 Obstructive sleep apnea (adult) (pediatric): Secondary | ICD-10-CM | POA: Diagnosis not present

## 2023-01-07 DIAGNOSIS — D2271 Melanocytic nevi of right lower limb, including hip: Secondary | ICD-10-CM | POA: Diagnosis not present

## 2023-01-07 DIAGNOSIS — D485 Neoplasm of uncertain behavior of skin: Secondary | ICD-10-CM | POA: Diagnosis not present

## 2023-01-14 DIAGNOSIS — C44729 Squamous cell carcinoma of skin of left lower limb, including hip: Secondary | ICD-10-CM | POA: Diagnosis not present

## 2023-01-16 ENCOUNTER — Encounter: Payer: Self-pay | Admitting: Internal Medicine

## 2023-01-17 DIAGNOSIS — R69 Illness, unspecified: Secondary | ICD-10-CM | POA: Diagnosis not present

## 2023-01-18 DIAGNOSIS — R69 Illness, unspecified: Secondary | ICD-10-CM | POA: Diagnosis not present

## 2023-01-19 ENCOUNTER — Other Ambulatory Visit: Payer: Self-pay | Admitting: Internal Medicine

## 2023-01-19 DIAGNOSIS — K219 Gastro-esophageal reflux disease without esophagitis: Secondary | ICD-10-CM

## 2023-01-27 DIAGNOSIS — C44719 Basal cell carcinoma of skin of left lower limb, including hip: Secondary | ICD-10-CM | POA: Diagnosis not present

## 2023-01-31 DIAGNOSIS — R69 Illness, unspecified: Secondary | ICD-10-CM | POA: Diagnosis not present

## 2023-02-02 DIAGNOSIS — R69 Illness, unspecified: Secondary | ICD-10-CM | POA: Diagnosis not present

## 2023-02-21 DIAGNOSIS — R69 Illness, unspecified: Secondary | ICD-10-CM | POA: Diagnosis not present

## 2023-02-28 DIAGNOSIS — M25461 Effusion, right knee: Secondary | ICD-10-CM | POA: Diagnosis not present

## 2023-03-15 DIAGNOSIS — R69 Illness, unspecified: Secondary | ICD-10-CM | POA: Diagnosis not present

## 2023-03-22 DIAGNOSIS — D485 Neoplasm of uncertain behavior of skin: Secondary | ICD-10-CM | POA: Diagnosis not present

## 2023-03-22 DIAGNOSIS — C44722 Squamous cell carcinoma of skin of right lower limb, including hip: Secondary | ICD-10-CM | POA: Diagnosis not present

## 2023-03-24 DIAGNOSIS — R69 Illness, unspecified: Secondary | ICD-10-CM | POA: Diagnosis not present

## 2023-03-26 ENCOUNTER — Other Ambulatory Visit: Payer: Self-pay | Admitting: Orthopedic Surgery

## 2023-03-26 DIAGNOSIS — Z96651 Presence of right artificial knee joint: Secondary | ICD-10-CM | POA: Diagnosis not present

## 2023-03-29 DIAGNOSIS — R69 Illness, unspecified: Secondary | ICD-10-CM | POA: Diagnosis not present

## 2023-04-05 DIAGNOSIS — Z96651 Presence of right artificial knee joint: Secondary | ICD-10-CM | POA: Diagnosis not present

## 2023-04-06 ENCOUNTER — Encounter
Admission: RE | Admit: 2023-04-06 | Discharge: 2023-04-06 | Disposition: A | Discharge status: Home / Self Care | Payer: Medicare HMO | Source: Ambulatory Visit | Attending: Orthopedic Surgery | Admitting: Orthopedic Surgery

## 2023-04-06 DIAGNOSIS — Z96651 Presence of right artificial knee joint: Secondary | ICD-10-CM | POA: Diagnosis not present

## 2023-04-06 MED ORDER — TECHNETIUM TC 99M MEDRONATE IV KIT
20.0000 | PACK | Freq: Once | INTRAVENOUS | Status: AC | PRN
  Administered 2023-04-06: 21.78 via INTRAVENOUS

## 2023-04-21 DIAGNOSIS — M25461 Effusion, right knee: Secondary | ICD-10-CM | POA: Diagnosis not present

## 2023-04-21 DIAGNOSIS — Z96651 Presence of right artificial knee joint: Secondary | ICD-10-CM | POA: Diagnosis not present

## 2023-04-22 DIAGNOSIS — R69 Illness, unspecified: Secondary | ICD-10-CM | POA: Diagnosis not present

## 2023-05-05 DIAGNOSIS — R69 Illness, unspecified: Secondary | ICD-10-CM | POA: Diagnosis not present

## 2023-05-20 DIAGNOSIS — R69 Illness, unspecified: Secondary | ICD-10-CM | POA: Diagnosis not present

## 2023-05-26 DIAGNOSIS — D0462 Carcinoma in situ of skin of left upper limb, including shoulder: Secondary | ICD-10-CM | POA: Diagnosis not present

## 2023-05-26 DIAGNOSIS — D485 Neoplasm of uncertain behavior of skin: Secondary | ICD-10-CM | POA: Diagnosis not present

## 2023-05-26 DIAGNOSIS — C44722 Squamous cell carcinoma of skin of right lower limb, including hip: Secondary | ICD-10-CM | POA: Diagnosis not present

## 2023-05-26 DIAGNOSIS — C44622 Squamous cell carcinoma of skin of right upper limb, including shoulder: Secondary | ICD-10-CM | POA: Diagnosis not present

## 2023-05-26 DIAGNOSIS — D2371 Other benign neoplasm of skin of right lower limb, including hip: Secondary | ICD-10-CM | POA: Diagnosis not present

## 2023-05-30 DIAGNOSIS — R69 Illness, unspecified: Secondary | ICD-10-CM | POA: Diagnosis not present

## 2023-06-03 DIAGNOSIS — R69 Illness, unspecified: Secondary | ICD-10-CM | POA: Diagnosis not present

## 2023-06-09 ENCOUNTER — Encounter: Payer: Self-pay | Admitting: Internal Medicine

## 2023-06-09 ENCOUNTER — Ambulatory Visit: Payer: Medicare HMO | Attending: Internal Medicine | Admitting: Internal Medicine

## 2023-06-09 VITALS — BP 140/68 | HR 60 | Ht 70.0 in | Wt 169.5 lb

## 2023-06-09 DIAGNOSIS — I2510 Atherosclerotic heart disease of native coronary artery without angina pectoris: Secondary | ICD-10-CM | POA: Diagnosis not present

## 2023-06-09 DIAGNOSIS — E785 Hyperlipidemia, unspecified: Secondary | ICD-10-CM | POA: Diagnosis not present

## 2023-06-09 DIAGNOSIS — I10 Essential (primary) hypertension: Secondary | ICD-10-CM | POA: Diagnosis not present

## 2023-06-09 NOTE — Patient Instructions (Signed)

## 2023-06-09 NOTE — Progress Notes (Signed)
  Cardiology Office Note:  .   Date:  06/09/2023  ID:  Timothy Singleton, DOB [birth year], MRN [id number] PCP: Timothy Clos, MD  [facility] Providers Cardiologist:  New    History of Present Illness: .   Timothy Singleton is a 77 y.o. 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  He was previously followed at [facility] clinic by Dr. Gwen Singleton, having last been seen in 03/2022.  Today, Timothy Singleton reports that he has been feeling fairly well though he continues to have intermittent spells of significant acid reflux.  Overall, this has improved with avoidance of trigger foods and regular PPI use.  Leading up to his inferior STEMI, he did not have typical angina but thought that he was having bad acid reflux.  However, this was accompanied by a cold sweat that prompted him to seek medical care.  He has not had anything reminiscent of this.  He denies shortness of breath, palpitations, lightheadedness, and edema.  He does not monitor his blood pressure regularly at home.  ROS: See HPI  Studies Reviewed: Marland Kitchen        Exercise MPI (05/23/2015, [facility]): Abnormal study with moderate in size, mild in intensity, fixed inferior defect consistent with scar.  No significant ischemia noted.  LVEF 51% with inferior wall hypokinesis.  LHC/PCI (07/2014, [facility]): LMCA normal.  LAD with 80% mid vessel stenosis and 90% D1 lesion.  LCx with 80% ostial and 70% proximal stenosis.  RCA with 90% proximal and 100% distal lesion.  Patient treated with Xience Alpine 3.0 x 18 mm drug-eluting stent to distal RCA and Xience Alpine 3.5 x 12 mm drug-eluting stent to the proximal RCA.  He underwent subsequent FFR evaluation of the LAD, which was significant in the mid vessel (0.74).  Decision was made to manage this medically.  Risk  Assessment/Calculations:            Physical Exam:   VS:  BP (!) 140/68 (BP Location: Right Arm, Patient Position: Sitting, Cuff Size: Normal)   Pulse 60   Ht 5\' 10"  (1.778 m)   Wt 169 lb 8 oz (76.9 kg)   SpO2 98%   BMI 24.32 kg/m    Wt Readings from Last 3 Encounters:  06/09/23 169 lb 8 oz (76.9 kg)  01/04/23 181 lb (82.1 kg)  01/01/23 175 lb 9.6 oz (79.7 kg)    General:  NAD. Neck: No JVD or HJR. Lungs: Clear to auscultation bilaterally without wheezes or crackles. Heart: Regular rate and rhythm without murmurs, rubs, or gallops. Abdomen: Soft, nontender, nondistended. Extremities: No lower extremity edema.  ASSESSMENT AND PLAN: .    Coronary artery disease: No angina reported, though Timothy Singleton never had typical symptoms even at the time of his inferior STEMI in 2015.  We have agreed to continue his current medications for secondary prevention, including aspirin and atorvastatin.  Hyperlipidemia: Lipids well-controlled on last check in March with total cholesterol of 121, triglycerides 68, HDL 39, and LDL 69.  Continue atorvastatin 80 mg daily.  Hypertension: Blood pressure borderline elevated today but normal at his last visit with Timothy Singleton in March (120/80).  Continue current regimen of triamterene-HCTZ.  Sodium restriction encouraged.      Dispo: Return to clinic in 6 months.  Signed, Yvonne Kendall, MD

## 2023-06-11 ENCOUNTER — Encounter: Payer: Self-pay | Admitting: Internal Medicine

## 2023-06-11 DIAGNOSIS — R69 Illness, unspecified: Secondary | ICD-10-CM | POA: Diagnosis not present

## 2023-06-14 ENCOUNTER — Encounter: Payer: Self-pay | Admitting: Internal Medicine

## 2023-06-14 ENCOUNTER — Other Ambulatory Visit: Payer: Self-pay

## 2023-06-14 ENCOUNTER — Other Ambulatory Visit: Payer: Self-pay | Admitting: Internal Medicine

## 2023-06-14 DIAGNOSIS — K219 Gastro-esophageal reflux disease without esophagitis: Secondary | ICD-10-CM

## 2023-06-14 MED ORDER — FAMOTIDINE 20 MG PO TABS: ORAL_TABLET | ORAL | 3 refills | Status: DC

## 2023-06-14 MED ORDER — PANTOPRAZOLE SODIUM 40 MG PO TBEC
40.0000 mg | DELAYED_RELEASE_TABLET | Freq: Two times a day (BID) | ORAL | 3 refills | Status: DC
Start: 2023-06-14 — End: 2024-05-01

## 2023-06-14 NOTE — Telephone Encounter (Signed)
Can refill his requested medications Let him know that he can take famotidine 20 to 40 mg at bedtime as needed If he is having evening or nocturnal symptoms, he could start with 20 mg of famotidine but if ineffective take another 20 mg of famotidine in a relatively short amount of time

## 2023-06-16 DIAGNOSIS — C44622 Squamous cell carcinoma of skin of right upper limb, including shoulder: Secondary | ICD-10-CM | POA: Diagnosis not present

## 2023-06-30 DIAGNOSIS — D0462 Carcinoma in situ of skin of left upper limb, including shoulder: Secondary | ICD-10-CM | POA: Diagnosis not present

## 2023-07-01 DIAGNOSIS — Z0000 Encounter for general adult medical examination without abnormal findings: Secondary | ICD-10-CM | POA: Diagnosis not present

## 2023-07-01 DIAGNOSIS — Z79899 Other long term (current) drug therapy: Secondary | ICD-10-CM | POA: Diagnosis not present

## 2023-07-01 DIAGNOSIS — G500 Trigeminal neuralgia: Secondary | ICD-10-CM | POA: Diagnosis not present

## 2023-07-01 DIAGNOSIS — M542 Cervicalgia: Secondary | ICD-10-CM | POA: Diagnosis not present

## 2023-07-01 DIAGNOSIS — I2510 Atherosclerotic heart disease of native coronary artery without angina pectoris: Secondary | ICD-10-CM | POA: Diagnosis not present

## 2023-07-01 DIAGNOSIS — F325 Major depressive disorder, single episode, in full remission: Secondary | ICD-10-CM | POA: Diagnosis not present

## 2023-07-01 DIAGNOSIS — I10 Essential (primary) hypertension: Secondary | ICD-10-CM | POA: Diagnosis not present

## 2023-07-06 ENCOUNTER — Encounter (INDEPENDENT_AMBULATORY_CARE_PROVIDER_SITE_OTHER): Payer: Self-pay | Admitting: Vascular Surgery

## 2023-07-06 ENCOUNTER — Ambulatory Visit (INDEPENDENT_AMBULATORY_CARE_PROVIDER_SITE_OTHER): Payer: Medicare HMO

## 2023-07-06 ENCOUNTER — Ambulatory Visit (INDEPENDENT_AMBULATORY_CARE_PROVIDER_SITE_OTHER): Payer: Medicare HMO | Admitting: Vascular Surgery

## 2023-07-06 VITALS — BP 147/73 | HR 53 | Resp 16 | Wt 168.8 lb

## 2023-07-06 DIAGNOSIS — I6523 Occlusion and stenosis of bilateral carotid arteries: Secondary | ICD-10-CM

## 2023-07-06 DIAGNOSIS — I2510 Atherosclerotic heart disease of native coronary artery without angina pectoris: Secondary | ICD-10-CM | POA: Diagnosis not present

## 2023-07-06 DIAGNOSIS — E785 Hyperlipidemia, unspecified: Secondary | ICD-10-CM | POA: Diagnosis not present

## 2023-07-06 DIAGNOSIS — I10 Essential (primary) hypertension: Secondary | ICD-10-CM

## 2023-07-06 NOTE — Assessment & Plan Note (Signed)
His carotid duplex shows stable ICA stenosis in the 1 to 39% range with common carotid artery stenosis of less than 50%.  He had a previous CT angiogram suggesting 50 to 60% stenosis so this would be stable.  We will continue to follow this on 84-month intervals.  Continue current medical regimen which includes aspirin and statin agent.

## 2023-07-06 NOTE — Progress Notes (Signed)
MRN : [id number]  Timothy Singleton is a 77 y.o. ([birth year]) male who presents with chief complaint of No chief complaint on file. Marland Kitchen  History of Present Illness: Patient returns today in follow up of his carotid disease.  He is doing well without any focal neurologic symptoms.  He has no new complaints today.  His carotid duplex shows stable ICA stenosis in the 1 to 39% range with common carotid artery stenosis of less than 50%.  He had a previous CT angiogram suggesting 50 to 60% stenosis so this would be stable.  Current Outpatient Medications  Medication Sig Dispense Refill   aspirin 81 MG tablet Take 81 mg by mouth daily.     atorvastatin (LIPITOR) 80 MG tablet atorvastatin 80 mg tablet  TAKE 1 TABLET BY MOUTH EVERY DAY     carbamazepine (TEGRETOL XR) 200 MG 12 hr tablet Take 1 tablet (200 mg total) by mouth 2 (two) times daily. 180 tablet 1   Cholecalciferol (EQL VITAMIN D3) 50 MCG (2000 UT) CAPS      famotidine (PEPCID) 20 MG tablet TAKE 1-2 TABLETS BY MOUTH AT BEDTIME AS NEEDED 180 tablet 1   melatonin 3 MG TABS tablet Take 3 mg by mouth at bedtime.     meloxicam (MOBIC) 15 MG tablet Take 15 mg by mouth daily as needed.     pantoprazole (PROTONIX) 40 MG tablet Take 1 tablet (40 mg total) by mouth 2 (two) times daily before a meal. 120 tablet 3   sertraline (ZOLOFT) 100 MG tablet TAKE 1 TABLET BY MOUTH EVERY DAY 90 tablet 3   triamterene-hydrochlorothiazide (DYAZIDE) 37.5-25 MG capsule Take 1 each (1 capsule total) by mouth daily. 90 capsule 3   Current Facility-Administered Medications  Medication Dose Route Frequency Provider Last Rate Last Admin   0.9 %  sodium chloride infusion  500 mL Intravenous Once Pyrtle, Carie Caddy, MD        Past Medical History:  Diagnosis Date   Arthritis    Cancer (HCC)    melanoma / knee   Depression    Diverticulosis    GERD (gastroesophageal reflux disease)    Hyperlipemia    Hypertension    Internal hemorrhoids    Past heart attack     Sleep apnea    Trigeminal neuralgia     Past Surgical History:  Procedure Laterality Date   BUNIONECTOMY     COLONOSCOPY WITH PROPOFOL N/A 11/17/2016   Procedure: COLONOSCOPY WITH PROPOFOL;  Surgeon: Wyline Mood, MD;  Location: [facility];  Service: Endoscopy;  Laterality: N/A;   CORONARY STENT PLACEMENT     EXTRACORPOREAL SHOCK WAVE LITHOTRIPSY     EXTRACORPOREAL SHOCK WAVE LITHOTRIPSY Right 12/30/2017   Procedure: EXTRACORPOREAL SHOCK WAVE LITHOTRIPSY (ESWL);  Surgeon: Vanna Scotland, MD;  Location: [facility];  Service: Urology;  Laterality: Right;   EXTRACORPOREAL SHOCK WAVE LITHOTRIPSY Right 06/20/2020   Procedure: EXTRACORPOREAL SHOCK WAVE LITHOTRIPSY (ESWL);  Surgeon: Vanna Scotland, MD;  Location: [facility];  Service: Urology;  Laterality: Right;   HERNIA REPAIR     inguinal-right   JOINT REPLACEMENT     total shoulder replacement   KNEE SURGERY Right    TONSILLECTOMY     TOTAL SHOULDER REPLACEMENT     UPPER GASTROINTESTINAL ENDOSCOPY     UPPER GI ENDOSCOPY  10/18/2001   hiatus hernia   VASECTOMY     WRIST SURGERY       Social History   Tobacco Use   Smoking status:  Former    Current packs/day: 1.00    Average packs/day: 1 pack/day for 60.7 years (60.7 ttl pk-yrs)    Types: Cigarettes    Start date: 1964   Smokeless tobacco: Former  Building services engineer status: Never Used  Substance Use Topics   Alcohol use: Not Currently    Alcohol/week: 6.0 standard drinks of alcohol    Types: 4 Cans of beer, 2 Standard drinks or equivalent per week   Drug use: No      Family History  Problem Relation Age of Onset   Cancer Mother    Dementia Mother    Heart attack Father    Stroke Father    Heart disease Father    Hypertension Father    Breast cancer Sister    Parkinson's disease Brother    Hypertension Brother    Melanoma Maternal Grandmother    Bladder Cancer Neg Hx    Prostate cancer Neg Hx    Kidney cancer Neg Hx    Colon cancer Neg Hx    Rectal cancer  Neg Hx    Stomach cancer Neg Hx    Esophageal cancer Neg Hx     No Known Allergies  REVIEW OF SYSTEMS (Negative unless checked)   Constitutional: [] Weight loss  [] Fever  [] Chills Cardiac: [] Chest pain   [] Chest pressure   [] Palpitations   [] Shortness of breath when laying flat   [] Shortness of breath at rest   [x] Shortness of breath with exertion. Vascular:  [] Pain in legs with walking   [] Pain in legs at rest   [] Pain in legs when laying flat   [] Claudication   [] Pain in feet when walking  [] Pain in feet at rest  [] Pain in feet when laying flat   [] History of DVT   [] Phlebitis   [] Swelling in legs   [] Varicose veins   [] Non-healing ulcers Pulmonary:   [] Uses home oxygen   [] Productive cough   [] Hemoptysis   [] Wheeze  [] COPD   [] Asthma Neurologic:  [x] Dizziness  [] Blackouts   [] Seizures   [] History of stroke   [] History of TIA  [x] Aphasia   [] Temporary blindness   [] Dysphagia   [] Weakness or numbness in arms   [] Weakness or numbness in legs Musculoskeletal:  [x] Arthritis   [] Joint swelling   [] Joint pain   [] Low back pain Hematologic:  [] Easy bruising  [] Easy bleeding   [] Hypercoagulable state   [] Anemic  [] Hepatitis Gastrointestinal:  [] Blood in stool   [] Vomiting blood  [x] Gastroesophageal reflux/heartburn   [] Abdominal pain Genitourinary:  [] Chronic kidney disease   [] Difficult urination  [] Frequent urination  [] Burning with urination   [] Hematuria Skin:  [] Rashes   [] Ulcers   [] Wounds Psychological:  [] History of anxiety   []  History of major depression.  Physical Examination  There were no vitals taken for this visit. Gen:  WD/WN, NAD. Appears younger than stated age. Head: [place]/AT, No temporalis wasting. Ear/Nose/Throat: Hearing grossly intact, nares w/o erythema or drainage Eyes: Conjunctiva clear. Sclera non-icteric Neck: Supple.  Trachea midline Pulmonary:  Good air movement, no use of accessory muscles.  Cardiac: RRR, no JVD Vascular:  Vessel Right Left  Radial Palpable  Palpable               Musculoskeletal: M/S 5/5 throughout.  No deformity or atrophy. No edema. Neurologic: Sensation grossly intact in extremities.  Symmetrical.  Speech is fluent.  Psychiatric: Judgment intact, Mood & affect appropriate for pt's clinical situation. Dermatologic: No rashes or ulcers noted.  No cellulitis  or open wounds.      Labs No results found for this or any previous visit (from the past 2160 hour(s)).  Radiology No results found.  Assessment/Plan Essential (primary) hypertension blood pressure control important in reducing the progression of atherosclerotic disease. On appropriate oral medications.     Combined fat and carbohydrate induced hyperlipemia lipid control important in reducing the progression of atherosclerotic disease. Continue statin therapy     CAD in native artery If we end up doing surgery, we will ask his cardiologist to assess his surgical risk.  No problem-specific Assessment & Plan notes found for this encounter.    Festus Barren, MD  07/06/2023 8:47 AM    This note was created with Dragon medical transcription system.  Any errors from dictation are purely unintentional

## 2023-07-23 DIAGNOSIS — R69 Illness, unspecified: Secondary | ICD-10-CM | POA: Diagnosis not present

## 2023-07-26 DIAGNOSIS — H2513 Age-related nuclear cataract, bilateral: Secondary | ICD-10-CM | POA: Diagnosis not present

## 2023-08-10 DIAGNOSIS — R69 Illness, unspecified: Secondary | ICD-10-CM | POA: Diagnosis not present

## 2023-08-16 DIAGNOSIS — L821 Other seborrheic keratosis: Secondary | ICD-10-CM | POA: Diagnosis not present

## 2023-08-16 DIAGNOSIS — Z85828 Personal history of other malignant neoplasm of skin: Secondary | ICD-10-CM | POA: Diagnosis not present

## 2023-08-16 DIAGNOSIS — D2261 Melanocytic nevi of right upper limb, including shoulder: Secondary | ICD-10-CM | POA: Diagnosis not present

## 2023-08-16 DIAGNOSIS — L570 Actinic keratosis: Secondary | ICD-10-CM | POA: Diagnosis not present

## 2023-08-16 DIAGNOSIS — D2262 Melanocytic nevi of left upper limb, including shoulder: Secondary | ICD-10-CM | POA: Diagnosis not present

## 2023-08-16 DIAGNOSIS — Z85820 Personal history of malignant melanoma of skin: Secondary | ICD-10-CM | POA: Diagnosis not present

## 2023-08-16 DIAGNOSIS — D2272 Melanocytic nevi of left lower limb, including hip: Secondary | ICD-10-CM | POA: Diagnosis not present

## 2023-08-16 DIAGNOSIS — D2271 Melanocytic nevi of right lower limb, including hip: Secondary | ICD-10-CM | POA: Diagnosis not present

## 2023-08-16 DIAGNOSIS — D225 Melanocytic nevi of trunk: Secondary | ICD-10-CM | POA: Diagnosis not present

## 2023-08-16 DIAGNOSIS — Z08 Encounter for follow-up examination after completed treatment for malignant neoplasm: Secondary | ICD-10-CM | POA: Diagnosis not present

## 2023-08-28 DIAGNOSIS — R69 Illness, unspecified: Secondary | ICD-10-CM | POA: Diagnosis not present

## 2023-08-31 DIAGNOSIS — R69 Illness, unspecified: Secondary | ICD-10-CM | POA: Diagnosis not present

## 2023-09-19 DIAGNOSIS — R69 Illness, unspecified: Secondary | ICD-10-CM | POA: Diagnosis not present

## 2023-10-01 DIAGNOSIS — R69 Illness, unspecified: Secondary | ICD-10-CM | POA: Diagnosis not present

## 2023-10-11 DIAGNOSIS — R69 Illness, unspecified: Secondary | ICD-10-CM | POA: Diagnosis not present

## 2023-10-24 DIAGNOSIS — R109 Unspecified abdominal pain: Secondary | ICD-10-CM | POA: Diagnosis not present

## 2023-10-24 DIAGNOSIS — K5100 Ulcerative (chronic) pancolitis without complications: Secondary | ICD-10-CM | POA: Diagnosis not present

## 2023-10-24 DIAGNOSIS — K5900 Constipation, unspecified: Secondary | ICD-10-CM | POA: Diagnosis not present

## 2023-10-24 DIAGNOSIS — Z7982 Long term (current) use of aspirin: Secondary | ICD-10-CM | POA: Diagnosis not present

## 2023-10-24 DIAGNOSIS — N200 Calculus of kidney: Secondary | ICD-10-CM | POA: Diagnosis not present

## 2023-10-24 DIAGNOSIS — R110 Nausea: Secondary | ICD-10-CM | POA: Diagnosis not present

## 2023-10-24 DIAGNOSIS — R9431 Abnormal electrocardiogram [ECG] [EKG]: Secondary | ICD-10-CM | POA: Diagnosis not present

## 2023-10-24 DIAGNOSIS — Z7902 Long term (current) use of antithrombotics/antiplatelets: Secondary | ICD-10-CM | POA: Diagnosis not present

## 2023-10-24 DIAGNOSIS — N210 Calculus in bladder: Secondary | ICD-10-CM | POA: Diagnosis not present

## 2023-10-24 DIAGNOSIS — Z87891 Personal history of nicotine dependence: Secondary | ICD-10-CM | POA: Diagnosis not present

## 2023-10-28 DIAGNOSIS — H02105 Unspecified ectropion of left lower eyelid: Secondary | ICD-10-CM | POA: Diagnosis not present

## 2023-10-28 DIAGNOSIS — H0289 Other specified disorders of eyelid: Secondary | ICD-10-CM | POA: Diagnosis not present

## 2023-10-28 DIAGNOSIS — H04123 Dry eye syndrome of bilateral lacrimal glands: Secondary | ICD-10-CM | POA: Diagnosis not present

## 2023-10-28 DIAGNOSIS — Z9889 Other specified postprocedural states: Secondary | ICD-10-CM | POA: Diagnosis not present

## 2023-11-16 DIAGNOSIS — D649 Anemia, unspecified: Secondary | ICD-10-CM | POA: Diagnosis not present

## 2023-11-16 DIAGNOSIS — Z87442 Personal history of urinary calculi: Secondary | ICD-10-CM | POA: Diagnosis not present

## 2023-11-16 DIAGNOSIS — G4733 Obstructive sleep apnea (adult) (pediatric): Secondary | ICD-10-CM | POA: Diagnosis not present

## 2023-11-16 DIAGNOSIS — Z96651 Presence of right artificial knee joint: Secondary | ICD-10-CM | POA: Diagnosis not present

## 2023-11-16 DIAGNOSIS — K2270 Barrett's esophagus without dysplasia: Secondary | ICD-10-CM | POA: Diagnosis not present

## 2023-11-16 DIAGNOSIS — N200 Calculus of kidney: Secondary | ICD-10-CM | POA: Diagnosis not present

## 2023-11-16 DIAGNOSIS — I2510 Atherosclerotic heart disease of native coronary artery without angina pectoris: Secondary | ICD-10-CM | POA: Diagnosis not present

## 2023-11-22 ENCOUNTER — Encounter: Payer: Self-pay | Admitting: Internal Medicine

## 2023-11-25 ENCOUNTER — Other Ambulatory Visit: Payer: Self-pay

## 2023-11-25 DIAGNOSIS — D649 Anemia, unspecified: Secondary | ICD-10-CM

## 2023-11-25 DIAGNOSIS — Z7689 Persons encountering health services in other specified circumstances: Secondary | ICD-10-CM | POA: Diagnosis not present

## 2023-12-01 ENCOUNTER — Other Ambulatory Visit (INDEPENDENT_AMBULATORY_CARE_PROVIDER_SITE_OTHER): Payer: Medicare HMO

## 2023-12-01 ENCOUNTER — Encounter: Payer: Self-pay | Admitting: Internal Medicine

## 2023-12-01 DIAGNOSIS — D649 Anemia, unspecified: Secondary | ICD-10-CM

## 2023-12-01 LAB — IBC + FERRITIN
Ferritin: 10.7 ng/mL — ABNORMAL LOW (ref 22.0–322.0)
Iron: 35 ug/dL — ABNORMAL LOW (ref 42–165)
Saturation Ratios: 7.5 % — ABNORMAL LOW (ref 20.0–50.0)
TIBC: 464.8 ug/dL — ABNORMAL HIGH (ref 250.0–450.0)
Transferrin: 332 mg/dL (ref 212.0–360.0)

## 2023-12-01 LAB — CBC WITH DIFFERENTIAL/PLATELET
Basophils Absolute: 0 10*3/uL (ref 0.0–0.1)
Basophils Relative: 0.8 % (ref 0.0–3.0)
Eosinophils Absolute: 0.6 10*3/uL (ref 0.0–0.7)
Eosinophils Relative: 10.7 % — ABNORMAL HIGH (ref 0.0–5.0)
HCT: 35.1 % — ABNORMAL LOW (ref 39.0–52.0)
Hemoglobin: 11.4 g/dL — ABNORMAL LOW (ref 13.0–17.0)
Lymphocytes Relative: 27.6 % (ref 12.0–46.0)
Lymphs Abs: 1.7 10*3/uL (ref 0.7–4.0)
MCHC: 32.4 g/dL (ref 30.0–36.0)
MCV: 80.2 fL (ref 78.0–100.0)
Monocytes Absolute: 0.6 10*3/uL (ref 0.1–1.0)
Monocytes Relative: 10 % (ref 3.0–12.0)
Neutro Abs: 3 10*3/uL (ref 1.4–7.7)
Neutrophils Relative %: 50.9 % (ref 43.0–77.0)
Platelets: 268 10*3/uL (ref 150.0–400.0)
RBC: 4.38 Mil/uL (ref 4.22–5.81)
RDW: 15.6 % — ABNORMAL HIGH (ref 11.5–15.5)
WBC: 6 10*3/uL (ref 4.0–10.5)

## 2023-12-02 ENCOUNTER — Other Ambulatory Visit: Payer: Self-pay | Admitting: Internal Medicine

## 2023-12-02 ENCOUNTER — Telehealth: Payer: Self-pay | Admitting: Pharmacy Technician

## 2023-12-02 DIAGNOSIS — D509 Iron deficiency anemia, unspecified: Secondary | ICD-10-CM | POA: Insufficient documentation

## 2023-12-02 NOTE — Telephone Encounter (Signed)
[name] Please communicate this with [name] JMP

## 2023-12-02 NOTE — Telephone Encounter (Signed)
Great - thanks

## 2023-12-02 NOTE — Telephone Encounter (Signed)
Dr. Rhea Belton, Update/correction. 2nd verification was completed and [facility] is [facility]. I have spoken with Rep Orlean Patten ref # [id number] and she has confirmed we are in net-work.  Original information I received was in-correct.  No auth needed and patient will be scheduled as soon as possible.  Sorry for the inconvenience  Selena Batten

## 2023-12-02 NOTE — Telephone Encounter (Signed)
Dr. Rhea Belton,  Patient will need to be referred to another site. Unfortunately [facility] is out of network with patients plan and does not have [employer] benefits.  Patient will need to call insurance and get In-Net works sites for treatment. Ref: [id number] PHONE: [phone]  [email or web address]  Please d/c treatment plan.  Thanks Selena Batten

## 2023-12-07 ENCOUNTER — Encounter: Payer: Self-pay | Admitting: Internal Medicine

## 2023-12-08 DIAGNOSIS — E291 Testicular hypofunction: Secondary | ICD-10-CM | POA: Diagnosis not present

## 2023-12-08 DIAGNOSIS — R5383 Other fatigue: Secondary | ICD-10-CM | POA: Diagnosis not present

## 2023-12-08 DIAGNOSIS — D509 Iron deficiency anemia, unspecified: Secondary | ICD-10-CM | POA: Diagnosis not present

## 2023-12-08 DIAGNOSIS — Z6823 Body mass index (BMI) 23.0-23.9, adult: Secondary | ICD-10-CM | POA: Diagnosis not present

## 2023-12-08 DIAGNOSIS — I10 Essential (primary) hypertension: Secondary | ICD-10-CM | POA: Diagnosis not present

## 2023-12-08 DIAGNOSIS — G479 Sleep disorder, unspecified: Secondary | ICD-10-CM | POA: Diagnosis not present

## 2023-12-08 DIAGNOSIS — M2550 Pain in unspecified joint: Secondary | ICD-10-CM | POA: Diagnosis not present

## 2023-12-08 DIAGNOSIS — K219 Gastro-esophageal reflux disease without esophagitis: Secondary | ICD-10-CM | POA: Diagnosis not present

## 2023-12-08 DIAGNOSIS — Z79890 Hormone replacement therapy (postmenopausal): Secondary | ICD-10-CM | POA: Diagnosis not present

## 2023-12-08 DIAGNOSIS — N529 Male erectile dysfunction, unspecified: Secondary | ICD-10-CM | POA: Diagnosis not present

## 2023-12-08 DIAGNOSIS — F339 Major depressive disorder, recurrent, unspecified: Secondary | ICD-10-CM | POA: Diagnosis not present

## 2023-12-08 DIAGNOSIS — G500 Trigeminal neuralgia: Secondary | ICD-10-CM | POA: Diagnosis not present

## 2023-12-09 ENCOUNTER — Encounter: Payer: Self-pay | Admitting: Internal Medicine

## 2023-12-13 ENCOUNTER — Encounter: Payer: Self-pay | Admitting: Internal Medicine

## 2023-12-14 ENCOUNTER — Ambulatory Visit (AMBULATORY_SURGERY_CENTER): Payer: HMO

## 2023-12-14 ENCOUNTER — Ambulatory Visit (INDEPENDENT_AMBULATORY_CARE_PROVIDER_SITE_OTHER): Payer: Self-pay

## 2023-12-14 ENCOUNTER — Telehealth: Payer: Self-pay

## 2023-12-14 ENCOUNTER — Telehealth: Payer: Self-pay | Admitting: Internal Medicine

## 2023-12-14 VITALS — Ht 71.0 in | Wt 165.0 lb

## 2023-12-14 VITALS — BP 131/58 | HR 60 | Temp 97.8°F | Resp 16 | Ht 71.0 in | Wt 169.0 lb

## 2023-12-14 DIAGNOSIS — D509 Iron deficiency anemia, unspecified: Secondary | ICD-10-CM | POA: Diagnosis not present

## 2023-12-14 DIAGNOSIS — F325 Major depressive disorder, single episode, in full remission: Secondary | ICD-10-CM | POA: Diagnosis not present

## 2023-12-14 DIAGNOSIS — I10 Essential (primary) hypertension: Secondary | ICD-10-CM | POA: Diagnosis not present

## 2023-12-14 DIAGNOSIS — I2510 Atherosclerotic heart disease of native coronary artery without angina pectoris: Secondary | ICD-10-CM | POA: Diagnosis not present

## 2023-12-14 MED ORDER — SODIUM CHLORIDE 0.9 % IV SOLN
510.0000 mg | Freq: Once | INTRAVENOUS | Status: AC
  Administered 2023-12-14: 510 mg via INTRAVENOUS
  Filled 2023-12-14: qty 17

## 2023-12-14 MED ORDER — DIPHENHYDRAMINE HCL 25 MG PO CAPS
25.0000 mg | ORAL_CAPSULE | Freq: Once | ORAL | Status: AC
  Administered 2023-12-14: 25 mg via ORAL
  Filled 2023-12-14: qty 1

## 2023-12-14 MED ORDER — ACETAMINOPHEN 325 MG PO TABS
650.0000 mg | ORAL_TABLET | Freq: Once | ORAL | Status: AC
  Administered 2023-12-14: 650 mg via ORAL
  Filled 2023-12-14: qty 2

## 2023-12-14 MED ORDER — SUFLAVE 178.7 G PO SOLR: 1.0000 | Freq: Once | ORAL | 0 refills | Status: AC

## 2023-12-14 NOTE — Progress Notes (Signed)

## 2023-12-14 NOTE — Telephone Encounter (Signed)
 Patient called and stated he was expecting a call at 1pm today. Ask for you to call him back as soon as possible.

## 2023-12-14 NOTE — Telephone Encounter (Signed)
 Unable to reach patient for pre visit.  Left message that I was trying to reach him and would call back in 5 min

## 2023-12-14 NOTE — Telephone Encounter (Signed)
 Pt had call from previsit.

## 2023-12-14 NOTE — Progress Notes (Signed)
 Diagnosis: Iron Deficiency Anemia  Provider:  Chilton Greathouse MD  Procedure: IV Infusion  IV Type: Peripheral, IV Location: R Antecubital  Feraheme (Ferumoxytol), Dose: 510 mg  Infusion Start Time: 1520  Infusion Stop Time: 1537  Post Infusion IV Care: Observation period completed and Peripheral IV Discontinued  Discharge: Condition: Good, Destination: Home . AVS Declined  Performed by:  Rico Ala, LPN

## 2023-12-14 NOTE — Telephone Encounter (Signed)
Reached patient and completed pre visit

## 2023-12-21 ENCOUNTER — Ambulatory Visit: Payer: Self-pay

## 2023-12-21 VITALS — BP 138/68 | HR 53 | Temp 97.5°F | Resp 14 | Ht 71.0 in | Wt 171.2 lb

## 2023-12-21 DIAGNOSIS — D509 Iron deficiency anemia, unspecified: Secondary | ICD-10-CM | POA: Diagnosis not present

## 2023-12-21 MED ORDER — DIPHENHYDRAMINE HCL 25 MG PO CAPS
25.0000 mg | ORAL_CAPSULE | Freq: Once | ORAL | Status: AC
  Administered 2023-12-21: 25 mg via ORAL
  Filled 2023-12-21: qty 1

## 2023-12-21 MED ORDER — FERUMOXYTOL INJECTION 510 MG/17 ML
510.0000 mg | Freq: Once | INTRAVENOUS | Status: AC
  Administered 2023-12-21: 510 mg via INTRAVENOUS
  Filled 2023-12-21: qty 17

## 2023-12-21 MED ORDER — ACETAMINOPHEN 325 MG PO TABS
650.0000 mg | ORAL_TABLET | Freq: Once | ORAL | Status: AC
  Administered 2023-12-21: 650 mg via ORAL
  Filled 2023-12-21: qty 2

## 2023-12-21 NOTE — Progress Notes (Signed)
 Diagnosis: Iron Deficiency Anemia  Provider:  Chilton Greathouse MD  Procedure: IV Infusion  IV Type: Peripheral, IV Location: R Antecubital  Feraheme (Ferumoxytol), Dose: 510 mg  Infusion Start Time: 1037  Infusion Stop Time: 1051  Post Infusion IV Care: Observation period completed and Peripheral IV Discontinued  Discharge: Condition: Good, Destination: Home . AVS Provided  Performed by:  Loney Hering, LPN

## 2023-12-28 ENCOUNTER — Encounter: Payer: Self-pay | Admitting: Internal Medicine

## 2024-01-03 DIAGNOSIS — E291 Testicular hypofunction: Secondary | ICD-10-CM | POA: Diagnosis not present

## 2024-01-04 ENCOUNTER — Ambulatory Visit (INDEPENDENT_AMBULATORY_CARE_PROVIDER_SITE_OTHER): Payer: Self-pay | Admitting: Vascular Surgery

## 2024-01-04 ENCOUNTER — Encounter (INDEPENDENT_AMBULATORY_CARE_PROVIDER_SITE_OTHER): Payer: Self-pay | Admitting: Vascular Surgery

## 2024-01-04 ENCOUNTER — Ambulatory Visit (INDEPENDENT_AMBULATORY_CARE_PROVIDER_SITE_OTHER): Payer: PPO

## 2024-01-04 VITALS — BP 168/92 | HR 58 | Resp 18 | Ht 71.0 in | Wt 169.2 lb

## 2024-01-04 DIAGNOSIS — I6523 Occlusion and stenosis of bilateral carotid arteries: Secondary | ICD-10-CM | POA: Diagnosis not present

## 2024-01-04 DIAGNOSIS — E785 Hyperlipidemia, unspecified: Secondary | ICD-10-CM

## 2024-01-04 DIAGNOSIS — I2510 Atherosclerotic heart disease of native coronary artery without angina pectoris: Secondary | ICD-10-CM

## 2024-01-04 DIAGNOSIS — I10 Essential (primary) hypertension: Secondary | ICD-10-CM

## 2024-01-04 NOTE — Assessment & Plan Note (Signed)
 His carotid duplex shows velocities in the 1 to 39% range in both internal carotid arteries with velocities consistent with less than 50% common carotid artery stenosis bilaterally.  Continue aspirin and Lipitor.  Follow-up in 6 months as we know there is roughly 50% common carotid artery stenosis by previous CT scan.  This has not progressed on duplex follow-up.

## 2024-01-04 NOTE — Progress Notes (Signed)
 MRN : [id number]  Timothy Singleton is a 78 y.o. (1946/07/24) male who presents with chief complaint of  Chief Complaint  Patient presents with   Follow-up    6 month carotid  .  History of Present Illness: Patient returns today in follow up of his carotid disease.  He is doing well today.  He denies any focal neurologic symptoms. Specifically, the patient denies amaurosis fugax, speech or swallowing difficulties, or arm or leg weakness or numbness.  We had a long discussion today about the sad state of college athletics.  His carotid duplex shows velocities in the 1 to 39% range in both internal carotid arteries with velocities consistent with less than 50% common carotid artery stenosis bilaterally.  Current Outpatient Medications  Medication Sig Dispense Refill   amoxicillin (AMOXIL) 500 MG capsule TAKE 1 FOUR TIMES A DAY UNTIL ALL ARE TAKEN BEGINNING THE MORNING OF SURGERY     anastrozole (ARIMIDEX) 1 MG tablet Take 1 mg by mouth every 14 (fourteen) days.     aspirin 81 MG tablet Take 81 mg by mouth daily.     atorvastatin (LIPITOR) 80 MG tablet atorvastatin 80 mg tablet  TAKE 1 TABLET BY MOUTH EVERY DAY     carbamazepine (TEGRETOL XR) 200 MG 12 hr tablet Take 1 tablet (200 mg total) by mouth 2 (two) times daily. 180 tablet 1   Cholecalciferol (EQL VITAMIN D3) 50 MCG (2000 UT) CAPS      famotidine (PEPCID) 20 MG tablet TAKE 1-2 TABLETS BY MOUTH AT BEDTIME AS NEEDED 180 tablet 1   melatonin 3 MG TABS tablet Take 3 mg by mouth at bedtime.     METAMUCIL FIBER PO Take by mouth.     pantoprazole (PROTONIX) 40 MG tablet Take 1 tablet (40 mg total) by mouth 2 (two) times daily before a meal. 120 tablet 3   sertraline (ZOLOFT) 100 MG tablet TAKE 1 TABLET BY MOUTH EVERY DAY 90 tablet 3   triamterene-hydrochlorothiazide (DYAZIDE) 37.5-25 MG capsule Take 1 each (1 capsule total) by mouth daily. 90 capsule 3   meloxicam (MOBIC) 15 MG tablet Take 15 mg by mouth daily as needed.     Current  Facility-Administered Medications  Medication Dose Route Frequency Provider Last Rate Last Admin   0.9 %  sodium chloride infusion  500 mL Intravenous Once Pyrtle, Carie Caddy, MD        Past Medical History:  Diagnosis Date   Arthritis    Cancer [facility])    melanoma / knee   Depression    Diverticulosis    GERD (gastroesophageal reflux disease)    Hyperlipemia    Hypertension    Internal hemorrhoids    Myocardial infarction [facility]) 2016   2 stents placed   Past heart attack    Sleep apnea    Trigeminal neuralgia     Past Surgical History:  Procedure Laterality Date   BUNIONECTOMY     COLONOSCOPY WITH PROPOFOL N/A 11/17/2016   Procedure: COLONOSCOPY WITH PROPOFOL;  Surgeon: Wyline Mood, MD;  Location: [facility] END[facility]COPY;  Service: Endoscopy;  Laterality: N/A;   CORONARY STENT PLACEMENT     EXTRACORPOREAL SHOCK WAVE LITHOTRIPSY     EXTRACORPOREAL SHOCK WAVE LITHOTRIPSY Right 12/30/2017   Procedure: EXTRACORPOREAL SHOCK WAVE LITHOTRIPSY (ESWL);  Surgeon: Vanna Scotland, MD;  Location: [facility] ORS;  Service: Urology;  Laterality: Right;   EXTRACORPOREAL SHOCK WAVE LITHOTRIPSY Right 06/20/2020   Procedure: EXTRACORPOREAL SHOCK WAVE LITHOTRIPSY (ESWL);  Surgeon: Vanna Scotland, MD;  Location: [facility] ORS;  Service: Urology;  Laterality: Right;   HERNIA REPAIR     inguinal-right   JOINT REPLACEMENT     total shoulder replacement   KNEE SURGERY Right    TONSILLECTOMY     TOTAL SHOULDER REPLACEMENT     UPPER GASTROINTESTINAL ENDOSCOPY     UPPER GI ENDOSCOPY  10/18/2001   hiatus hernia   VASECTOMY     WRIST SURGERY       Social History   Tobacco Use   Smoking status: Former    Current packs/day: 1.00    Average packs/day: 1 pack/day for 61.2 years (61.2 ttl pk-yrs)    Types: Cigarettes    Start date: 1964   Smokeless tobacco: Former  Building services engineer status: Never Used  Substance Use Topics   Alcohol use: Not Currently    Alcohol/week: 6.0 standard drinks of alcohol    Types: 4  Cans of beer, 2 Standard drinks or equivalent per week   Drug use: No      Family History  Problem Relation Age of Onset   Cancer Mother    Dementia Mother    Heart attack Father    Stroke Father    Heart disease Father    Hypertension Father    Breast cancer Sister    Parkinson's disease Brother    Hypertension Brother    Melanoma Maternal Grandmother    Bladder Cancer Neg Hx    Prostate cancer Neg Hx    Kidney cancer Neg Hx    Colon cancer Neg Hx    Rectal cancer Neg Hx    Stomach cancer Neg Hx    Esophageal cancer Neg Hx    Colon polyps Neg Hx      No Known Allergies    REVIEW OF SYSTEMS (Negative unless checked)   Constitutional: [] Weight loss  [] Fever  [] Chills Cardiac: [] Chest pain   [] Chest pressure   [] Palpitations   [] Shortness of breath when laying flat   [] Shortness of breath at rest   [x] Shortness of breath with exertion. Vascular:  [] Pain in legs with walking   [] Pain in legs at rest   [] Pain in legs when laying flat   [] Claudication   [] Pain in feet when walking  [] Pain in feet at rest  [] Pain in feet when laying flat   [] History of DVT   [] Phlebitis   [] Swelling in legs   [] Varicose veins   [] Non-healing ulcers Pulmonary:   [] Uses home oxygen   [] Productive cough   [] Hemoptysis   [] Wheeze  [] COPD   [] Asthma Neurologic:  [x] Dizziness  [] Blackouts   [] Seizures   [] History of stroke   [] History of TIA  [x] Aphasia   [] Temporary blindness   [] Dysphagia   [] Weakness or numbness in arms   [] Weakness or numbness in legs Musculoskeletal:  [x] Arthritis   [] Joint swelling   [] Joint pain   [] Low back pain Hematologic:  [] Easy bruising  [] Easy bleeding   [] Hypercoagulable state   [] Anemic  [] Hepatitis Gastrointestinal:  [] Blood in stool   [] Vomiting blood  [x] Gastroesophageal reflux/heartburn   [] Abdominal pain Genitourinary:  [] Chronic kidney disease   [] Difficult urination  [] Frequent urination  [] Burning with urination   [] Hematuria Skin:  [] Rashes   [] Ulcers    [] Wounds Psychological:  [] History of anxiety   []  History of major depression.  Physical Examination  BP (!) 168/92   Pulse (!) 58   Resp 18   Ht 5\' 11"  (1.803 m)   Wt 169  lb 3.2 oz (76.7 kg)   BMI 23.60 kg/m  Gen:  WD/WN, NAD. Appears younger than stated age. Head: [place]/AT, No temporalis wasting. Ear/Nose/Throat: Hearing grossly intact, nares w/o erythema or drainage Eyes: Conjunctiva clear. Sclera non-icteric Neck: Supple.  Trachea midline Pulmonary:  Good air movement, no use of accessory muscles.  Cardiac: RRR, no JVD Vascular:  Vessel Right Left  Radial Palpable Palpable               Musculoskeletal: M/S 5/5 throughout.  No deformity or atrophy. No edema. Neurologic: Sensation grossly intact in extremities.  Symmetrical.  Speech is fluent.  Psychiatric: Judgment intact, Mood & affect appropriate for pt's clinical situation. Dermatologic: No rashes or ulcers noted.  No cellulitis or open wounds.      Labs Recent Results (from the past 2160 hours)  IBC + Ferritin     Status: Abnormal   Collection Time: 12/01/23 12:54 PM  Result Value Ref Range   Iron 35 (L) 42 - 165 ug/dL   Transferrin 119.1 478.2 - 360.0 mg/dL   Saturation Ratios 7.5 (L) 20.0 - 50.0 %   Ferritin 10.7 (L) 22.0 - 322.0 ng/mL   TIBC 464.8 (H) 250.0 - 450.0 mcg/dL  CBC w/Diff     Status: Abnormal   Collection Time: 12/01/23 12:54 PM  Result Value Ref Range   WBC 6.0 4.0 - 10.5 K/uL   RBC 4.38 4.22 - 5.81 Mil/uL   Hemoglobin 11.4 (L) 13.0 - 17.0 g/dL   HCT 95.6 (L) 21.3 - 08.6 %   MCV 80.2 78.0 - 100.0 fl   MCHC 32.4 30.0 - 36.0 g/dL   RDW 57.8 (H) 46.9 - 62.9 %   Platelets 268.0 150.0 - 400.0 K/uL   Neutrophils Relative % 50.9 43.0 - 77.0 %   Lymphocytes Relative 27.6 12.0 - 46.0 %   Monocytes Relative 10.0 3.0 - 12.0 %   Eosinophils Relative 10.7 (H) 0.0 - 5.0 %   Basophils Relative 0.8 0.0 - 3.0 %   Neutro Abs 3.0 1.4 - 7.7 K/uL   Lymphs Abs 1.7 0.7 - 4.0 K/uL   Monocytes Absolute 0.6  0.1 - 1.0 K/uL   Eosinophils Absolute 0.6 0.0 - 0.7 K/uL   Basophils Absolute 0.0 0.0 - 0.1 K/uL    Radiology No results found.  Assessment/Plan  Bilateral carotid artery stenosis His carotid duplex shows velocities in the 1 to 39% range in both internal carotid arteries with velocities consistent with less than 50% common carotid artery stenosis bilaterally.  Continue aspirin and Lipitor.  Follow-up in 6 months as we know there is roughly 50% common carotid artery stenosis by previous CT scan.  This has not progressed on duplex follow-up.   Essential (primary) hypertension blood pressure control important in reducing the progression of atherosclerotic disease. On appropriate oral medications.     Combined fat and carbohydrate induced hyperlipemia lipid control important in reducing the progression of atherosclerotic disease. Continue statin therapy     CAD in native artery If we end up doing surgery, we will ask his cardiologist to assess his surgical risk. No need for surgery at this time.  Festus Barren, MD  01/04/2024 10:57 AM    This note was created with Dragon medical transcription system.  Any errors from dictation are purely unintentional

## 2024-01-11 DIAGNOSIS — M2550 Pain in unspecified joint: Secondary | ICD-10-CM | POA: Diagnosis not present

## 2024-01-11 DIAGNOSIS — E291 Testicular hypofunction: Secondary | ICD-10-CM | POA: Diagnosis not present

## 2024-01-11 DIAGNOSIS — R5383 Other fatigue: Secondary | ICD-10-CM | POA: Diagnosis not present

## 2024-01-11 DIAGNOSIS — Z6823 Body mass index (BMI) 23.0-23.9, adult: Secondary | ICD-10-CM | POA: Diagnosis not present

## 2024-01-13 ENCOUNTER — Encounter: Payer: Self-pay | Admitting: Internal Medicine

## 2024-01-13 ENCOUNTER — Ambulatory Visit: Payer: Self-pay | Admitting: Internal Medicine

## 2024-01-13 VITALS — BP 108/61 | HR 64 | Temp 98.1°F | Resp 11 | Ht 70.0 in | Wt 165.0 lb

## 2024-01-13 DIAGNOSIS — K2270 Barrett's esophagus without dysplasia: Secondary | ICD-10-CM

## 2024-01-13 DIAGNOSIS — I252 Old myocardial infarction: Secondary | ICD-10-CM | POA: Diagnosis not present

## 2024-01-13 DIAGNOSIS — G4730 Sleep apnea, unspecified: Secondary | ICD-10-CM | POA: Diagnosis not present

## 2024-01-13 DIAGNOSIS — K449 Diaphragmatic hernia without obstruction or gangrene: Secondary | ICD-10-CM

## 2024-01-13 DIAGNOSIS — I10 Essential (primary) hypertension: Secondary | ICD-10-CM | POA: Diagnosis not present

## 2024-01-13 DIAGNOSIS — K648 Other hemorrhoids: Secondary | ICD-10-CM

## 2024-01-13 DIAGNOSIS — Z860100 Personal history of colon polyps, unspecified: Secondary | ICD-10-CM | POA: Diagnosis not present

## 2024-01-13 DIAGNOSIS — K5730 Diverticulosis of large intestine without perforation or abscess without bleeding: Secondary | ICD-10-CM | POA: Diagnosis not present

## 2024-01-13 DIAGNOSIS — F32A Depression, unspecified: Secondary | ICD-10-CM | POA: Diagnosis not present

## 2024-01-13 DIAGNOSIS — Q439 Congenital malformation of intestine, unspecified: Secondary | ICD-10-CM

## 2024-01-13 DIAGNOSIS — D509 Iron deficiency anemia, unspecified: Secondary | ICD-10-CM | POA: Diagnosis not present

## 2024-01-13 DIAGNOSIS — K219 Gastro-esophageal reflux disease without esophagitis: Secondary | ICD-10-CM

## 2024-01-13 MED ORDER — SODIUM CHLORIDE 0.9 % IV SOLN: 500.0000 mL | Freq: Once | INTRAVENOUS | Status: DC

## 2024-01-13 NOTE — Progress Notes (Signed)
 GASTROENTEROLOGY PROCEDURE H&P NOTE   Primary Care Physician: Bosie Clos, MD    Reason for Procedure:  Iron deficiency anemia  Plan:    Upper and lower endoscopy  Patient is appropriate for endoscopic procedure(s) in the ambulatory ([facility]) setting.  The nature of the procedure, as well as the risks, benefits, and alternatives were carefully and thoroughly reviewed with the patient. Ample time for discussion and questions allowed. The patient understood, was satisfied, and agreed to proceed.     HPI: Timothy Singleton is a 78 y.o. male who presents for EGD and colonoscopy.  Medical history as below.  Tolerated the prep.  No recent chest pain or shortness of breath.  No abdominal pain today.  Past Medical History:  Diagnosis Date   Arthritis    Cancer ([facility])    melanoma / knee   Depression    Diverticulosis    GERD (gastroesophageal reflux disease)    Hyperlipemia    Hypertension    Internal hemorrhoids    Myocardial infarction [facility]) 2016   2 stents placed   Past heart attack    Sleep apnea    Trigeminal neuralgia     Past Surgical History:  Procedure Laterality Date   BUNIONECTOMY     COLONOSCOPY WITH PROPOFOL N/A 11/17/2016   Procedure: COLONOSCOPY WITH PROPOFOL;  Surgeon: Wyline Mood, MD;  Location: [facility];  Service: Endoscopy;  Laterality: N/A;   CORONARY STENT PLACEMENT     EXTRACORPOREAL SHOCK WAVE LITHOTRIPSY     EXTRACORPOREAL SHOCK WAVE LITHOTRIPSY Right 12/30/2017   Procedure: EXTRACORPOREAL SHOCK WAVE LITHOTRIPSY (ESWL);  Surgeon: Vanna Scotland, MD;  Location: [facility] ORS;  Service: Urology;  Laterality: Right;   EXTRACORPOREAL SHOCK WAVE LITHOTRIPSY Right 06/20/2020   Procedure: EXTRACORPOREAL SHOCK WAVE LITHOTRIPSY (ESWL);  Surgeon: Vanna Scotland, MD;  Location: [facility] ORS;  Service: Urology;  Laterality: Right;   HERNIA REPAIR     inguinal-right   JOINT REPLACEMENT     total shoulder replacement   KNEE SURGERY Right     TONSILLECTOMY     TOTAL SHOULDER REPLACEMENT     UPPER GASTROINTESTINAL ENDOSCOPY     UPPER GI ENDOSCOPY  10/18/2001   hiatus hernia   VASECTOMY     WRIST SURGERY      Prior to Admission medications   Medication Sig Start Date End Date Taking? Authorizing Provider  aspirin 81 MG tablet Take 81 mg by mouth daily.   Yes [provider]  atorvastatin (LIPITOR) 80 MG tablet atorvastatin 80 mg tablet  TAKE 1 TABLET BY MOUTH EVERY DAY   Yes [provider]  carbamazepine (TEGRETOL XR) 200 MG 12 hr tablet Take 1 tablet (200 mg total) by mouth 2 (two) times daily. 02/10/22  Yes Bosie Clos, MD  Cholecalciferol (EQL VITAMIN D3) 50 MCG (2000 UT) CAPS    Yes [provider]  famotidine (PEPCID) 20 MG tablet TAKE 1-2 TABLETS BY MOUTH AT BEDTIME AS NEEDED 06/14/23  Yes Francetta Ilg, Carie Caddy, MD  melatonin 3 MG TABS tablet Take 3 mg by mouth at bedtime.   Yes [provider]  pantoprazole (PROTONIX) 40 MG tablet Take 1 tablet (40 mg total) by mouth 2 (two) times daily before a meal. 06/14/23  Yes Kate Sweetman, Carie Caddy, MD  sertraline (ZOLOFT) 100 MG tablet TAKE 1 TABLET BY MOUTH EVERY DAY 06/15/22  Yes Bosie Clos, MD  triamterene-hydrochlorothiazide (DYAZIDE) 37.5-25 MG capsule Take 1 each (1 capsule total) by mouth daily. 06/10/22  Yes  Bosie Clos, MD  anastrozole (ARIMIDEX) 1 MG tablet Take 1 mg by mouth every 14 (fourteen) days. 12/08/23   [provider]  METAMUCIL FIBER PO Take by mouth.    [provider]    Current Outpatient Medications  Medication Sig Dispense Refill   aspirin 81 MG tablet Take 81 mg by mouth daily.     atorvastatin (LIPITOR) 80 MG tablet atorvastatin 80 mg tablet  TAKE 1 TABLET BY MOUTH EVERY DAY     carbamazepine (TEGRETOL XR) 200 MG 12 hr tablet Take 1 tablet (200 mg total) by mouth 2 (two) times daily. 180 tablet 1   Cholecalciferol (EQL VITAMIN D3) 50 MCG (2000 UT) CAPS      famotidine (PEPCID) 20 MG tablet TAKE  1-2 TABLETS BY MOUTH AT BEDTIME AS NEEDED 180 tablet 1   melatonin 3 MG TABS tablet Take 3 mg by mouth at bedtime.     pantoprazole (PROTONIX) 40 MG tablet Take 1 tablet (40 mg total) by mouth 2 (two) times daily before a meal. 120 tablet 3   sertraline (ZOLOFT) 100 MG tablet TAKE 1 TABLET BY MOUTH EVERY DAY 90 tablet 3   triamterene-hydrochlorothiazide (DYAZIDE) 37.5-25 MG capsule Take 1 each (1 capsule total) by mouth daily. 90 capsule 3   anastrozole (ARIMIDEX) 1 MG tablet Take 1 mg by mouth every 14 (fourteen) days.     METAMUCIL FIBER PO Take by mouth.     Current Facility-Administered Medications  Medication Dose Route Frequency Provider Last Rate Last Admin   0.9 %  sodium chloride infusion  500 mL Intravenous Once Ceil Roderick, Carie Caddy, MD       0.9 %  sodium chloride infusion  500 mL Intravenous Once Marquavis Hannen, Carie Caddy, MD        Allergies as of 01/13/2024   (No Known Allergies)    Family History  Problem Relation Age of Onset   Cancer Mother    Dementia Mother    Heart attack Father    Stroke Father    Heart disease Father    Hypertension Father    Breast cancer Sister    Parkinson's disease Brother    Hypertension Brother    Melanoma Maternal Grandmother    Bladder Cancer Neg Hx    Prostate cancer Neg Hx    Kidney cancer Neg Hx    Colon cancer Neg Hx    Rectal cancer Neg Hx    Stomach cancer Neg Hx    Esophageal cancer Neg Hx    Colon polyps Neg Hx     Social History   Socioeconomic History   Marital status: Married    Spouse name: Not on file   Number of children: 2   Years of education: Not on file   Highest education level: Bachelor's degree (e.g., BA, AB, BS)  Occupational History   Occupation: retired  Tobacco Use   Smoking status: Former    Current packs/day: 1.00    Average packs/day: 1 pack/day for 61.2 years (61.2 ttl pk-yrs)    Types: Cigarettes    Start date: 1964   Smokeless tobacco: Former  Building services engineer status: Never Used  Substance and  Sexual Activity   Alcohol use: Not Currently    Alcohol/week: 6.0 standard drinks of alcohol    Types: 4 Cans of beer, 2 Standard drinks or equivalent per week   Drug use: No   Sexual activity: Not on file  Other Topics Concern   Not  on file  Social History Narrative   Not on file   Social Drivers of Health   Financial Resource Strain: Low Risk  (07/01/2023)   Received from [facility]   Overall Financial Resource Strain (CARDIA)    Difficulty of Paying Living Expenses: Not hard at all  Food Insecurity: No Food Insecurity (07/01/2023)   Received from [facility]   Hunger Vital Sign    Worried About Running Out of Food in the Last Year: Never true    Ran Out of Food in the Last Year: Never true  Transportation Needs: No Transportation Needs (07/01/2023)   Received from [place] - Transportation    In the past 12 months, has lack of transportation kept you from medical appointments or from getting medications?: No    Lack of Transportation (Non-Medical): No  Physical Activity: Insufficiently Active (07/01/2023)   Received from [facility]   Exercise Vital Sign    Days of Exercise per Week: 3 days    Minutes of Exercise per Session: 30 min  Stress: No Stress Concern Present (11/17/2021)   [employer] of Occupational Health - Occupational Stress Questionnaire    Feeling of Stress : Not at all  Social Connections: Unknown (03/01/2022)   Received from [facility]   Social Network    Social Network: Not on file  Intimate Partner Violence: Not At Risk (10/24/2023)   Received from [facility]   HITS    Over the last 12 months how often did your partner physically hurt you?: Never    Over the last 12 months how often did your partner insult you or talk down to you?: Never    Over the last 12 months how often did your partner threaten you with physical harm?: Never    Over the last  12 months how often did your partner scream or curse at you?: Never    Physical Exam: Vital signs in last 24 hours: @BP  129/61   Pulse 67   Temp 98.1 F (36.7 C) (Skin)   Resp 17   Ht 5\' 10"  (1.778 m)   Wt 165 lb (74.8 kg)   SpO2 97%   BMI 23.68 kg/m  GEN: NAD EYE: Sclerae anicteric ENT: MMM CV: Non-tachycardic Pulm: CTA b/l GI: Soft, NT/ND NEURO:  Alert & Oriented x 3   Erick Blinks, MD [facility]  01/13/2024 3:19 PM

## 2024-01-13 NOTE — Op Note (Signed)
 [facility] Patient Name: Timothy Singleton Procedure Date: 01/13/2024 3:10 PM MRN: [id number] Endoscopist: Beverley Fiedler , MD, [id number] Age: 78 Referring MD:  Date of Birth: 1945-11-18 Gender: Male Account #: [id number] Procedure:                Colonoscopy Indications:              Iron deficiency anemia, remote hx of polyps, last                            exam Jan 2018 without polyps Medicines:                Monitored Anesthesia Care Procedure:                Pre-Anesthesia Assessment:                           - Prior to the procedure, a History and Physical                            was performed, and patient medications and                            allergies were reviewed. The patient's tolerance of                            previous anesthesia was also reviewed. The risks                            and benefits of the procedure and the sedation                            options and risks were discussed with the patient.                            All questions were answered, and informed consent                            was obtained. Prior Anticoagulants: The patient has                            taken no anticoagulant or antiplatelet agents. ASA                            Grade Assessment: II - A patient with mild systemic                            disease. After reviewing the risks and benefits,                            the patient was deemed in satisfactory condition to                            undergo the procedure.  After obtaining informed consent, the colonoscope                            was passed under direct vision. Throughout the                            procedure, the patient's blood pressure, pulse, and                            oxygen saturations were monitored continuously. The                            Olympus Scope [id number] was introduced through the                            anus and advanced to the cecum,  identified by                            appendiceal orifice and ileocecal valve. The                            colonoscopy was technically difficult and complex                            due to multiple diverticula in the colon,                            restricted mobility of the colon and a tortuous                            colon. Successful completion of the procedure was                            aided by withdrawing the scope and replacing with                            the UltraSlim colonoscope. The patient tolerated                            the procedure well. The quality of the bowel                            preparation was good. The ileocecal valve,                            appendiceal orifice, and rectum were photographed. Scope In: 3:39:45 PM Scope Out: 4:10:39 PM Scope Withdrawal Time: 0 hours 10 minutes 50 seconds  Total Procedure Duration: 0 hours 30 minutes 54 seconds  Findings:                 The digital rectal exam was normal.                           Multiple medium-mouthed and small-mouthed  diverticula were found in the sigmoid colon,                            descending colon, transverse colon and hepatic                            flexure.                           Internal hemorrhoids were found during                            retroflexion. The hemorrhoids were small.                           The exam was otherwise without abnormality. Complications:            No immediate complications. Estimated Blood Loss:     Estimated blood loss: none. Impression:               - Moderate diverticulosis in the sigmoid colon, in                            the descending colon, in the transverse colon and                            at the hepatic flexure.                           - Internal hemorrhoids.                           - The examination was otherwise normal.                           - No specimens  collected. Recommendation:           - Patient has a contact number available for                            emergencies. The signs and symptoms of potential                            delayed complications were discussed with the                            patient. Return to normal activities tomorrow.                            Written discharge instructions were provided to the                            patient.                           - Resume previous diet.                           -  Continue present medications.                           - No repeat colonoscopy due to age.                           - Proceed with outpatient video capsule endoscopy                            to complete GI evaluation of IDA.                           - Continue iron replacement with attention to iron                            stores and Hgb (every 3 months). Beverley Fiedler, MD 01/13/2024 4:18:02 PM This report has been signed electronically.

## 2024-01-13 NOTE — Progress Notes (Signed)
 Pt's states no medical or surgical changes since previsit or office visit.

## 2024-01-13 NOTE — Patient Instructions (Addendum)
 -Handout on hiatal hernia, diverticulosis and hemorrhoids provided -await pathology results  -Continue present medications  -Continue iron replacement with attention to iron levels and hemoglobin (every 3 months)   YOU HAD AN ENDOSCOPIC PROCEDURE TODAY AT THE [facility]:   Refer to the procedure report that was given to you for any specific questions about what was found during the examination.  If the procedure report does not answer your questions, please call your gastroenterologist to clarify.  If you requested that your care partner not be given the details of your procedure findings, then the procedure report has been included in a sealed envelope for you to review at your convenience later.  YOU SHOULD EXPECT: Some feelings of bloating in the abdomen. Passage of more gas than usual.  Walking can help get rid of the air that was put into your GI tract during the procedure and reduce the bloating. If you had a lower endoscopy (such as a colonoscopy or flexible sigmoidoscopy) you may notice spotting of blood in your stool or on the toilet paper. If you underwent a bowel prep for your procedure, you may not have a normal bowel movement for a few days.  Please Note:  You might notice some irritation and congestion in your nose or some drainage.  This is from the oxygen used during your procedure.  There is no need for concern and it should clear up in a day or so.  SYMPTOMS TO REPORT IMMEDIATELY:  Following lower endoscopy (colonoscopy or flexible sigmoidoscopy):  Excessive amounts of blood in the stool  Significant tenderness or worsening of abdominal pains  Swelling of the abdomen that is new, acute  Fever of 100F or higher  Following upper endoscopy (EGD)  Vomiting of blood or coffee ground material  New chest pain or pain under the shoulder blades  Painful or persistently difficult swallowing  New shortness of breath  Fever of 100F or higher  Black, tarry-looking  stools  For urgent or emergent issues, a gastroenterologist can be reached at any hour by calling [phone]. Do not use MyChart messaging for urgent concerns.    DIET:  We do recommend a small meal at first, but then you may proceed to your regular diet.  Drink plenty of fluids but you should avoid alcoholic beverages for 24 hours.  ACTIVITY:  You should plan to take it easy for the rest of today and you should NOT DRIVE or use heavy machinery until tomorrow (because of the sedation medicines used during the test).    FOLLOW UP: Our staff will call the number listed on your records the next business day following your procedure.  We will call around 7:15- 8:00 am to check on you and address any questions or concerns that you may have regarding the information given to you following your procedure. If we do not reach you, we will leave a message.     If any biopsies were taken you will be contacted by phone or by letter within the next 1-3 weeks.  Please call us at [phone] if you have not heard about the biopsies in 3 weeks.    SIGNATURES/CONFIDENTIALITY: You and/or your care partner have signed paperwork which will be entered into your electronic medical record.  These signatures attest to the fact that that the information above on your After Visit Summary has been reviewed and is understood.  Full responsibility of the confidentiality of this discharge information lies with you and/or your care-partner.  Hiatal Hernia  A hiatal hernia occurs when part of the stomach slides above the muscle that separates the abdomen from the chest (diaphragm). A person can be born with a hiatal hernia (congenital), or it may develop over time. In almost all cases of hiatal hernia, only the top part of the stomach pushes through the diaphragm. Many people have a hiatal hernia with no symptoms. The larger the hernia, the more likely it is that you will have symptoms. In some cases, a hiatal  hernia allows stomach acid to flow back into the tube that carries food from your mouth to your stomach (esophagus). This may cause heartburn symptoms. The development of heartburn symptoms may mean that you have a condition called gastroesophageal reflux disease (GERD). What are the causes? This condition is caused by a weakness in the opening (hiatus) where the esophagus passes through the diaphragm to attach to the upper part of the stomach. A person may be born with a weakness in the hiatus, or a weakness can develop over time. What increases the risk? This condition is more likely to develop in: Older people. Age is a major risk factor for a hiatal hernia, especially if you are over the age of 67. Pregnant women. People who are overweight. People who have frequent constipation. What are the signs or symptoms? Symptoms of this condition usually develop in the form of GERD symptoms. Symptoms include: Heartburn. Upset stomach (indigestion). Trouble swallowing. Coughing or wheezing. Wheezing is making high-pitched whistling sounds when you breathe. Sore throat. Chest pain. Nausea and vomiting. How is this diagnosed? This condition may be diagnosed during testing for GERD. Tests that may be done include: X-rays of your stomach or chest. An upper gastrointestinal (GI) series. This is an X-ray exam of your GI tract that is taken after you swallow a chalky liquid that shows up clearly on the X-ray. Endoscopy. This is a procedure to look into your stomach using a thin, flexible tube that has a tiny camera and light on the end of it. How is this treated? This condition may be treated by: Dietary and lifestyle changes to help reduce GERD symptoms. Medicines. These may include: Over-the-counter antacids. Medicines that make your stomach empty more quickly. Medicines that block the production of stomach acid (H2 blockers). Stronger medicines to reduce stomach acid (proton pump  inhibitors). Surgery to repair the hernia, if other treatments are not helping. If you have no symptoms, you may not need treatment. Follow these instructions at home: Lifestyle and activity Do not use any products that contain nicotine or tobacco. These products include cigarettes, chewing tobacco, and vaping devices, such as e-cigarettes. If you need help quitting, ask your health care provider. Try to achieve and maintain a healthy body weight. Avoid putting pressure on your abdomen. Anything that puts pressure on your abdomen increases the amount of acid that may be pushed up into your esophagus. Avoid bending over, especially after eating. Raise the head of your bed by putting blocks under the legs. This keeps your head and esophagus higher than your stomach. Do not wear tight clothing around your chest or stomach. Try not to strain when having a bowel movement, when urinating, or when lifting heavy objects. Eating and drinking Avoid foods that can worsen GERD symptoms. These may include: Fatty foods, like fried foods. Citrus fruits, like oranges or lemon. Other foods and drinks that contain acid, like orange juice or tomatoes. Spicy food. Chocolate. Eat frequent small meals instead of three large meals  a day. This helps prevent your stomach from getting too full. Eat slowly. Do not lie down right after eating. Do not eat 1-2 hours before bed. Do not drink beverages with caffeine. These include cola, coffee, cocoa, and tea. Do not drink alcohol. General instructions Take over-the-counter and prescription medicines only as told by your health care provider. Keep all follow-up visits. Your health care provider will want to check that any new prescribed medicines are helping your symptoms. Contact a health care provider if: Your symptoms are not controlled with medicines or lifestyle changes. You are having trouble swallowing. You have coughing or wheezing that will not go  away. Your pain is getting worse. Your pain spreads to your arms, neck, jaw, teeth, or back. You feel nauseous or you vomit. Get help right away if: You have shortness of breath. You vomit blood. You have bright red blood in your stools. You have black, tarry stools. These symptoms may be an emergency. Get help right away. Call 911. Do not wait to see if the symptoms will go away. Do not drive yourself to the hospital. Summary A hiatal hernia occurs when part of the stomach slides above the muscle that separates the abdomen from the chest. A person may be born with a weakness in the hiatus, or a weakness can develop over time. Symptoms of a hiatal hernia may include heartburn, trouble swallowing, or sore throat. Management of a hiatal hernia includes eating frequent small meals instead of three large meals a day. Get help right away if you vomit blood, have bright red blood in your stools, or have black, tarry stools. This information is not intended to replace advice given to you by your health care provider. Make sure you discuss any questions you have with your health care provider. Document Revised: 12/02/2021 Document Reviewed: 12/02/2021 Elsevier Patient Education  2024 [employer].

## 2024-01-13 NOTE — Progress Notes (Signed)
 Called to room to assist during endoscopic procedure.  Patient ID and intended procedure confirmed with present staff. Received instructions for my participation in the procedure from the performing physician.

## 2024-01-13 NOTE — Progress Notes (Signed)
 Report to PACU, RN, vss, BBS= Clear.

## 2024-01-13 NOTE — Op Note (Signed)
  [facility] Patient Name: Timothy Singleton Procedure Date: 01/13/2024 3:17 PM MRN: [id number] Endoscopist: Beverley Fiedler , MD, [id number] Age: 78 Referring MD:  Date of Birth: 08-Dec-1945 Gender: Male Account #: [id number] Procedure:                Upper GI endoscopy Indications:              Iron deficiency anemia, hx of GERD with esophagitis                            and short-segment, non-dysplastic, Barrett's Medicines:                Monitored Anesthesia Care Procedure:                Pre-Anesthesia Assessment:                           - Prior to the procedure, a History and Physical                            was performed, and patient medications and                            allergies were reviewed. The patient's tolerance of                            previous anesthesia was also reviewed. The risks                            and benefits of the procedure and the sedation                            options and risks were discussed with the patient.                            All questions were answered, and informed consent                            was obtained. Prior Anticoagulants: The patient has                            taken no anticoagulant or antiplatelet agents. ASA                            Grade Assessment: II - A patient with mild systemic                            disease. After reviewing the risks and benefits,                            the patient was deemed in satisfactory condition to                            undergo the procedure.  After obtaining informed consent, the endoscope was                            passed under direct vision. Throughout the                            procedure, the patient's blood pressure, pulse, and                            oxygen saturations were monitored continuously. The                            GIF [id number] #[id number] was introduced through the                            mouth, and  advanced to the second part of duodenum.                            The upper GI endoscopy was accomplished without                            difficulty. The patient tolerated the procedure                            well. Scope In: Scope Out: Findings:                 The Z-line was irregular and was found 39 cm from                            the incisors. Biopsies were taken with a cold                            forceps for histology.                           A 1 cm hiatal hernia was present.                           The entire examined stomach was normal. Biopsies                            were taken with a cold forceps for Helicobacter                            pylori testing.                           The examined duodenum was normal. Biopsies were                            taken with a cold forceps for histology. Complications:            No immediate complications. Estimated Blood Loss:     Estimated blood loss was minimal. Impression:               -  Z-line irregular, 39 cm from the incisors.                            Biopsied (if Barrett's it is stable).                           - 1 cm hiatal hernia.                           - Normal stomach. Biopsied.                           - Normal examined duodenum. Biopsied. Recommendation:           - Patient has a contact number available for                            emergencies. The signs and symptoms of potential                            delayed complications were discussed with the                            patient. Return to normal activities tomorrow.                            Written discharge instructions were provided to the                            patient.                           - Resume previous diet.                           - Continue present medications.                           - Await pathology results.                           - See the other procedure note for documentation of                             additional recommendations. Beverley Fiedler, MD 01/13/2024 4:14:08 PM This report has been signed electronically.

## 2024-01-14 ENCOUNTER — Other Ambulatory Visit: Payer: Self-pay

## 2024-01-14 ENCOUNTER — Telehealth: Payer: Self-pay | Admitting: *Deleted

## 2024-01-14 DIAGNOSIS — D509 Iron deficiency anemia, unspecified: Secondary | ICD-10-CM

## 2024-01-14 NOTE — Telephone Encounter (Signed)
  Follow up Call-     01/13/2024    2:13 PM 01/04/2023    9:51 AM 10/23/2022   12:47 PM  Call back number  Post procedure Call Back phone  # [phone]  Permission to leave phone message Yes Yes Yes     Patient questions:   Message left to call us if necessary.

## 2024-01-18 LAB — SURGICAL PATHOLOGY

## 2024-01-20 ENCOUNTER — Encounter: Payer: Self-pay | Admitting: Internal Medicine

## 2024-02-08 ENCOUNTER — Ambulatory Visit: Admitting: Internal Medicine

## 2024-02-08 DIAGNOSIS — D509 Iron deficiency anemia, unspecified: Secondary | ICD-10-CM

## 2024-02-08 NOTE — Progress Notes (Signed)
Contact our office immediately at [phone] if you suffer from any abdominal pain, nausea, or vomiting during capsule endoscopy. a) Do not eat or drink for at least 2 hours. After 2 hours you may have any of the following to drink: Water   White grape juice 7-Up   Chicken Bouillon Sprite   Ginger Ale c) After 4 hours you may have a light snack to include any of the following: A cup of soup   sandwich Bowl of cereal  Rice Toast   Eggs 2-3 small cookies (i.e. vanilla wafers or graham crackers) d) After 8 hours you may return to your regular diet. During your procedure do not go near anyone else that is having capsule endoscopy. Do not be in close contact with an MRI machine or a radio or television tower. Do not wear a heavy coat or sweater because your recorder may over heat and stop recording.   Do not disconnect the equipment or remove the belt at any time.  Since the Data Recorder is actually a small computer, it should be treated with utmost care and protection.  Avoid sudden movement and banging of the Data Recorder.  Do not do any heavy lifting or strenuous physical activity during the test especially if it involves sweating and do not bend over or stoop during capsule endoscopy. During capsule endoscopy, you will need to verify every 15 minutes that the small light on top of the Data Recorder is blinking twice per second.  If for some reason it stops blinking at this site, record the time and contact our office at [phone].   Patient Name @name ID No: @MRN   Time Event (eating, drinking, activity and unusual sensations)                         Who to call in case of need: Time to return to facility: 4:00     Special Instructions:                          

## 2024-02-08 NOTE — Progress Notes (Signed)
 SN: [id number] Exp: 07/10/2024 LOT: [id number]  Patient arrived for VCE. Reported the prep went well. This RN explained capsule dietary restrictions for the next few hours. Pt advised to return at 4 pm to return capsule equipment.  Patient verbalized understanding. Opened capsule, ensured capsule was flashing prior to the patient swallowing the capsule. Patient swallowed capsule without difficulty.  Patient told to call the office with any questions and if capsule has not passed after 72 hours. No further questions by the conclusion of the visit.    You may have clear liquids beginning at 10:30 am after ingesting the capsule.    You can have a light lunch at 12:30 pm; sandwich and half bowl of soup.  Return to the office at 4 pm to return the equipment.   Return to you normal diet at 5 pm.   Call [phone] and ask for Seward Dao BSN RN if you have any questions.  You should pass the capsule in your stool 8-48 hours after ingestion. If you have not passed the capsule, after 72 hours, please contact the office at [phone].

## 2024-02-15 ENCOUNTER — Encounter: Payer: Self-pay | Admitting: Internal Medicine

## 2024-02-15 DIAGNOSIS — G4733 Obstructive sleep apnea (adult) (pediatric): Secondary | ICD-10-CM | POA: Diagnosis not present

## 2024-02-15 DIAGNOSIS — F325 Major depressive disorder, single episode, in full remission: Secondary | ICD-10-CM | POA: Diagnosis not present

## 2024-02-15 DIAGNOSIS — Z96651 Presence of right artificial knee joint: Secondary | ICD-10-CM | POA: Diagnosis not present

## 2024-02-15 DIAGNOSIS — D508 Other iron deficiency anemias: Secondary | ICD-10-CM | POA: Diagnosis not present

## 2024-02-15 DIAGNOSIS — K2270 Barrett's esophagus without dysplasia: Secondary | ICD-10-CM | POA: Diagnosis not present

## 2024-02-15 DIAGNOSIS — I2510 Atherosclerotic heart disease of native coronary artery without angina pectoris: Secondary | ICD-10-CM | POA: Diagnosis not present

## 2024-02-19 ENCOUNTER — Encounter: Payer: Self-pay | Admitting: Internal Medicine

## 2024-02-19 DIAGNOSIS — D509 Iron deficiency anemia, unspecified: Secondary | ICD-10-CM

## 2024-02-21 DIAGNOSIS — E291 Testicular hypofunction: Secondary | ICD-10-CM | POA: Diagnosis not present

## 2024-02-22 DIAGNOSIS — D485 Neoplasm of uncertain behavior of skin: Secondary | ICD-10-CM | POA: Diagnosis not present

## 2024-02-22 DIAGNOSIS — D2261 Melanocytic nevi of right upper limb, including shoulder: Secondary | ICD-10-CM | POA: Diagnosis not present

## 2024-02-22 DIAGNOSIS — D0472 Carcinoma in situ of skin of left lower limb, including hip: Secondary | ICD-10-CM | POA: Diagnosis not present

## 2024-02-22 DIAGNOSIS — Z85820 Personal history of malignant melanoma of skin: Secondary | ICD-10-CM | POA: Diagnosis not present

## 2024-02-22 DIAGNOSIS — Z85828 Personal history of other malignant neoplasm of skin: Secondary | ICD-10-CM | POA: Diagnosis not present

## 2024-02-22 DIAGNOSIS — D2271 Melanocytic nevi of right lower limb, including hip: Secondary | ICD-10-CM | POA: Diagnosis not present

## 2024-02-22 DIAGNOSIS — D2272 Melanocytic nevi of left lower limb, including hip: Secondary | ICD-10-CM | POA: Diagnosis not present

## 2024-02-22 DIAGNOSIS — D2262 Melanocytic nevi of left upper limb, including shoulder: Secondary | ICD-10-CM | POA: Diagnosis not present

## 2024-02-22 DIAGNOSIS — L570 Actinic keratosis: Secondary | ICD-10-CM | POA: Diagnosis not present

## 2024-02-22 DIAGNOSIS — L820 Inflamed seborrheic keratosis: Secondary | ICD-10-CM | POA: Diagnosis not present

## 2024-02-23 DIAGNOSIS — M2550 Pain in unspecified joint: Secondary | ICD-10-CM | POA: Diagnosis not present

## 2024-02-23 DIAGNOSIS — E291 Testicular hypofunction: Secondary | ICD-10-CM | POA: Diagnosis not present

## 2024-02-23 DIAGNOSIS — R5383 Other fatigue: Secondary | ICD-10-CM | POA: Diagnosis not present

## 2024-02-23 DIAGNOSIS — Z6822 Body mass index (BMI) 22.0-22.9, adult: Secondary | ICD-10-CM | POA: Diagnosis not present

## 2024-02-23 NOTE — Telephone Encounter (Signed)
 Cbc, ibc + ferritin in 3 months

## 2024-03-01 ENCOUNTER — Encounter: Payer: Self-pay | Admitting: Internal Medicine

## 2024-03-03 ENCOUNTER — Ambulatory Visit: Admitting: Physician Assistant

## 2024-03-03 NOTE — Progress Notes (Deleted)
 Cardiology Office Note    Date:  03/03/2024   ID:  Timothy Singleton, DOB [birth year], MRN [id number]  PCP:  Nikki Barters, MD  Cardiologist:  Sammy Crisp, MD  Electrophysiologist:  None   Chief Complaint: ***  History of Present Illness:   Timothy Singleton is a 78 y.o. 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 Bary Likes. He had inferior STEMI in 2015 with PCI/DES placement to the distal RCA.  Exercise MPI 05/2015 showed abnormal study with moderate-sized, mild in intensity, fixed inferior defect consistent with scar.  No significant ischemia noted.  EF 51% with inferior wall hypokinesis.    Patient was most recently seen by and established care with Dr. Nolan Battle 05/2023.  At that time he was doing well from a cardiac perspective with intermittent spells of significant acid reflux.  Leading up to his prior STEMI, he did not have typical angina but thought that he was having bad acid reflux.  However, this was accompanied by a cold sweat that prompted him to seek medical care. At the time of his last appointment, there were no indications for further testing of medication changes.  ***  Labs independently reviewed: 02/15/2024-Hgb 14.5, HCT 44.3 10/24/2023- Na 143, K 4.4, BUN 21, Cr 1.24, normal LFTs 01/07/2023-TC 121, TG 68, HDL 38, LDL 69  Past Medical History:  Diagnosis Date   Arthritis    Cancer (HCC)    melanoma / knee   Depression    Diverticulosis    GERD (gastroesophageal reflux disease)    Hyperlipemia    Hypertension    Internal hemorrhoids    Myocardial infarction (HCC) 2016   2 stents placed   Past heart attack    Sleep apnea    Trigeminal neuralgia     Past Surgical History:  Procedure Laterality Date   BUNIONECTOMY     COLONOSCOPY WITH PROPOFOL  N/A 11/17/2016   Procedure:  COLONOSCOPY WITH PROPOFOL ;  Surgeon: Luke Salaam, MD;  Location: [facility];  Service: Endoscopy;  Laterality: N/A;   CORONARY STENT PLACEMENT     EXTRACORPOREAL SHOCK WAVE LITHOTRIPSY     EXTRACORPOREAL SHOCK WAVE LITHOTRIPSY Right 12/30/2017   Procedure: EXTRACORPOREAL SHOCK WAVE LITHOTRIPSY (ESWL);  Surgeon: Dustin Gimenez, MD;  Location: [facility] OR[facility];  Service: Urology;  Laterality: Right;   EXTRACORPOREAL SHOCK WAVE LITHOTRIPSY Right 06/20/2020   Procedure: EXTRACORPOREAL SHOCK WAVE LITHOTRIPSY (ESWL);  Surgeon: Dustin Gimenez, MD;  Location: [facility] OR[facility];  Service: Urology;  Laterality: Right;   HERNIA REPAIR     inguinal-right   JOINT REPLACEMENT     total shoulder replacement   KNEE SURGERY Right    TONSILLECTOMY     TOTAL SHOULDER REPLACEMENT     UPPER GASTROINTESTINAL ENDOSCOPY     UPPER GI ENDOSCOPY  10/18/2001   hiatus hernia   VASECTOMY     WRIST SURGERY      Current Medications: No outpatient medications have been marked as taking for the 03/03/24 encounter (Appointment) with Roark Chick, PA-C.   Current Facility-Administered Medications for the 03/03/24 encounter (Appointment) with Roark Chick, PA-C  Medication   0.9 %  sodium chloride  infusion    Allergies:   Patient has no known allergies.   Social History   Socioeconomic History   Marital status: Married    Spouse name: Not on  file   Number of children: 2   Years of education: Not on file   Highest education level: Bachelor's degree (e.g., BA, AB, BS)  Occupational History   Occupation: retired  Tobacco Use   Smoking status: Former    Current packs/day: 1.00    Average packs/day: 1 pack/day for 61.4 years (61.4 ttl pk-yrs)    Types: Cigarettes    Start date: 1964   Smokeless tobacco: Former  Building services engineer status: Never Used  Substance and Sexual Activity   Alcohol use: Not Currently    Alcohol/week: 6.0 standard drinks of alcohol    Types: 4 Cans of beer, 2 Standard drinks or equivalent per  week   Drug use: No   Sexual activity: Not on file  Other Topics Concern   Not on file  Social History Narrative   Not on file   Social Drivers of Health   Financial Resource Strain: Low Risk  (07/01/2023)   Received [facility]   Overall Financial Resource Strain (CARDIA)    Difficulty of Paying Living Expenses: Not hard at all  Food Insecurity: No Food Insecurity (07/01/2023)   Received from [facility]   Hunger Vital Sign    Worried About Running Out of Food in the Last Year: Never true    Ran Out of Food in the Last Year: Never true  Transportation Needs: No Transportation Needs (07/01/2023)   Received from [facility] - Transportation    In the past 12 months, has lack of transportation kept you from medical appointments or from getting medications?: No    Lack of Transportation (Non-Medical): No  Physical Activity: Insufficiently Active (07/01/2023)   Received from [facility]   Exercise Vital Sign    Days of Exercise per Week: 3 days    Minutes of Exercise per Session: 30 min  Stress: No Stress Concern Present (11/17/2021)   [employer] - Occupational Stress Questionnaire    Feeling of Stress : Not at all  Social Connections: Unknown (03/01/2022)   Received from [facility]   Social Network    Social Network: Not on file     Family History:  The patient's family history includes Breast cancer in his sister; Cancer in his mother; Dementia in his mother; Heart attack in his father; Heart disease in his father; Hypertension in his brother and father; Melanoma in his maternal grandmother; Parkinson's disease in his brother; Stroke in his father. There is no history of Bladder Cancer, Prostate cancer, Kidney cancer, Colon cancer, Rectal cancer, Stomach cancer, Esophageal cancer, or Colon polyps.  ROS:   12-point review of systems is negative unless  otherwise noted in the HPI.   EKGs/Labs/Other Studies Reviewed:    Studies reviewed were summarized above. The additional studies were reviewed today: ***  EKG:  EKG is ordered today.  The EKG ordered today demonstrates ***  Recent Labs: 12/01/2023: Hemoglobin 11.4; Platelets 268.0  Recent Lipid Panel    Component Value Date/Time   CHOL 124 12/31/2021 0812   TRIG 88 12/31/2021 0812   HDL 38 (L) 12/31/2021 0812   CHOLHDL 3.3 12/31/2021 0812   CHOLHDL 4.8 10/04/2017 0935   LDLCALC 69 12/31/2021 0812   LDLCALC 101 (H) 10/04/2017 0935    PHYSICAL EXAM:    VS:  There were no vitals taken for this visit.  BMI: There is no height or weight on  file to calculate BMI.  Physical Exam  Wt Readings from Last 3 Encounters:  01/13/24 165 lb (74.8 kg)  01/04/24 169 lb 3.2 oz (76.7 kg)  12/21/23 171 lb 3.2 oz (77.7 kg)     ASSESSMENT & PLAN:      {Are you ordering a CV Procedure (e.g. stress test, cath, DCCV, TEE, etc)?   Press F2        :[id number]}     Disposition: F/u with Dr. Nolan Battle or an APP in ***.   Medication Adjustments/Labs and Tests Ordered: Current medicines are reviewed at length with the patient today.  Concerns regarding medicines are outlined above. Medication changes, Labs and Tests ordered today are summarized above and listed in the Patient Instructions accessible in Encounters.   Beather Liming, PA-C 03/03/2024 7:53 AM     [facility] [place] [address] [place], [place] [address] [phone]

## 2024-03-07 ENCOUNTER — Encounter (INDEPENDENT_AMBULATORY_CARE_PROVIDER_SITE_OTHER): Payer: Self-pay

## 2024-03-29 NOTE — Progress Notes (Signed)
 Cardiology Office Note    Date:  03/30/2024   ID:  Alford Gamero, DOB [birth year], MRN [id number]  PCP:  Nikki Barters, MD  Cardiologist:  Sammy Crisp, MD  Electrophysiologist:  None   Chief Complaint: Follow up  History of Present Illness:   Timothy Singleton is a 78 y.o. male with history of CAD with STEMI s/p PCI to RCA in 07/2014, TIAs, hypertension, hyperlipidemia, trigeminal neuralgia, and GERD who is being seen today for follow-up of CAD.  Patient was previously followed by [facility] clinic by Dr. Bary Likes. He had inferior STEMI in 2015 with PCI/DES placement to the distal RCA.  Exercise MPI 05/2015 showed abnormal study with moderate-sized, mild in intensity, fixed inferior defect consistent with scar.  No significant ischemia noted.  EF 51% with inferior wall hypokinesis.    Patient was most recently seen by and established care with Dr. Nolan Battle 05/2023.  At that time he was doing well from a cardiac perspective with intermittent spells of significant acid reflux.  Leading up to his prior STEMI, he did not have typical angina but thought that he was having bad acid reflux.  However, this was accompanied by a cold sweat that prompted him to seek medical care. At the time of his last appointment, there were no indications for further testing of medication changes.   He comes in today and is doing very well from a cardiac perspective, without symptoms of angina or cardiac decompensation.  Remains active at baseline playing golf regularly and exercising for 1 hour several days per week without cardiac limitation.  No dizziness, presyncope, or syncope.  No falls or symptoms concerning for bleeding.  Reflux has been very well-controlled.  No symptoms concerning for prior angina.  Adherent and tolerating cardiac pharmacotherapy.  Does not have any acute cardiac concerns at this time.   Labs independently reviewed: 01/2024 - Hgb 14.5, HCT 44.3 10/2023 - potassium 4.4, BUN 21, SCr  1.24, AST/ALT normal 12/2022 - TC 121, TG 68, HDL 38, LDL 69  Past Medical History:  Diagnosis Date   Arthritis    Cancer (HCC)    melanoma / knee   Depression    Diverticulosis    GERD (gastroesophageal reflux disease)    Hyperlipemia    Hypertension    Internal hemorrhoids    Myocardial infarction (HCC) 2016   2 stents placed   Past heart attack    Sleep apnea    Trigeminal neuralgia     Past Surgical History:  Procedure Laterality Date   BUNIONECTOMY     COLONOSCOPY WITH PROPOFOL  N/A 11/17/2016   Procedure: COLONOSCOPY WITH PROPOFOL ;  Surgeon: Luke Salaam, MD;  Location: [facility] END[facility]COPY;  Service: Endoscopy;  Laterality: N/A;   CORONARY STENT PLACEMENT     EXTRACORPOREAL SHOCK WAVE LITHOTRIPSY     EXTRACORPOREAL SHOCK WAVE LITHOTRIPSY Right 12/30/2017   Procedure: EXTRACORPOREAL SHOCK WAVE LITHOTRIPSY (ESWL);  Surgeon: Dustin Gimenez, MD;  Location: [facility] ORS;  Service: Urology;  Laterality: Right;   EXTRACORPOREAL SHOCK WAVE LITHOTRIPSY Right 06/20/2020   Procedure: EXTRACORPOREAL SHOCK WAVE LITHOTRIPSY (ESWL);  Surgeon: Dustin Gimenez, MD;  Location: [facility] ORS;  Service: Urology;  Laterality: Right;   HERNIA REPAIR     inguinal-right   JOINT REPLACEMENT     total shoulder replacement   KNEE SURGERY Right    TONSILLECTOMY     TOTAL SHOULDER REPLACEMENT     UPPER GASTROINTESTINAL ENDOSCOPY     UPPER GI ENDOSCOPY  10/18/2001   hiatus  hernia   VASECTOMY     WRIST SURGERY      Current Medications: Current Meds  Medication Sig   anastrozole (ARIMIDEX) 1 MG tablet Take 1 mg by mouth every 14 (fourteen) days.   aspirin 81 MG tablet Take 81 mg by mouth daily.   atorvastatin  (LIPITOR) 80 MG tablet atorvastatin  80 mg tablet  TAKE 1 TABLET BY MOUTH EVERY DAY   carbamazepine  (TEGRETOL  XR) 200 MG 12 hr tablet Take 1 tablet (200 mg total) by mouth 2 (two) times daily.   Cholecalciferol (EQL VITAMIN D3) 50 MCG (2000 UT) CAPS    famotidine  (PEPCID ) 20 MG tablet TAKE 1-2  TABLETS BY MOUTH AT BEDTIME AS NEEDED   melatonin 3 MG TABS tablet Take 3 mg by mouth at bedtime.   METAMUCIL FIBER PO Take by mouth.   pantoprazole  (PROTONIX ) 40 MG tablet Take 1 tablet (40 mg total) by mouth 2 (two) times daily before a meal.   sertraline  (ZOLOFT ) 100 MG tablet TAKE 1 TABLET BY MOUTH EVERY DAY   triamterene -hydrochlorothiazide (DYAZIDE) 37.5-25 MG capsule Take 1 each (1 capsule total) by mouth daily.   Current Facility-Administered Medications for the 03/30/24 encounter (Office Visit) with Roark Chick, PA-C  Medication   0.9 %  sodium chloride  infusion    Allergies:   Patient has no known allergies.   Social History   Socioeconomic History   Marital status: Married    Spouse name: Not on file   Number of children: 2   Years of education: Not on file   Highest education level: Bachelor's degree (e.g., BA, AB, BS)  Occupational History   Occupation: retired  Tobacco Use   Smoking status: Former    Current packs/day: 1.00    Average packs/day: 1 pack/day for 61.4 years (61.4 ttl pk-yrs)    Types: Cigarettes    Start date: 1964   Smokeless tobacco: Former  Building services engineer status: Never Used  Substance and Sexual Activity   Alcohol use: Not Currently    Alcohol/week: 6.0 standard drinks of alcohol    Types: 4 Cans of beer, 2 Standard drinks or equivalent per week   Drug use: No   Sexual activity: Not on file  Other Topics Concern   Not on file  Social History Narrative   Not on file   Social Drivers of Health   Financial Resource Strain: Low Risk  (07/01/2023)   Received from [facility]   Overall Financial Resource Strain (CARDIA)    Difficulty of Paying Living Expenses: Not hard at all  Food Insecurity: No Food Insecurity (07/01/2023)   Received from [facility]   Hunger Vital Sign    Worried About Running Out of Food in the Last Year: Never true    Ran Out of Food in the Last Year: Never true  Transportation  Needs: No Transportation Needs (07/01/2023)   Received from [facility] - Transportation    In the past 12 months, has lack of transportation kept you from medical appointments or from getting medications?: No    Lack of Transportation (Non-Medical): No  Physical Activity: Insufficiently Active (07/01/2023)   Received from [facility]   Exercise Vital Sign    Days of Exercise per Week: 3 days    Minutes of Exercise per Session: 30 min  Stress: No Stress Concern Present (11/17/2021)   [employer] - Occupational Stress Questionnaire  Feeling of Stress : Not at all  Social Connections: Unknown (03/01/2022)   Received from [facility]   Social Network    Social Network: Not on file     Family History:  The patient's family history includes Breast cancer in his sister; Cancer in his mother; Dementia in his mother; Heart attack in his father; Heart disease in his father; Hypertension in his brother and father; Melanoma in his maternal grandmother; Parkinson's disease in his brother; Stroke in his father. There is no history of Bladder Cancer, Prostate cancer, Kidney cancer, Colon cancer, Rectal cancer, Stomach cancer, Esophageal cancer, or Colon polyps.  ROS:   12-point review of systems is negative unless otherwise noted in the HPI.   EKGs/Labs/Other Studies Reviewed:    Studies reviewed were summarized above. The additional studies were reviewed today:  Treadmill MPI 05/23/2015 Ivette Marks): EF 51% Perfusion Analysis:  SPECT images demonstrate moderate perfusion  abnormality of mild intensity is present in the inferior myocardial region  on the stress images.  As well as at rest most consistent with previous  myocardial infarction without evidence of myocardial ischemia. __________  2D echo 08/13/2014 ([facility]): ECHOCARDIOGRAPHIC DESCRIPTIONS -----------------------------------------------  AORTIC ROOT           Size: DILATED    Dissection: INDETERM FOR DISSECTION   AORTIC VALVE      Leaflets: Tricuspid             Morphology: Normal      Mobility: Fully Mobile   LEFT VENTRICLE                                      Anterior: Normal          Size: Normal                                 Lateral: Normal   Contraction: REGIONALLY IMPAIRED                     Septal: AKINETIC    Closest EF: 45%  (Estimated)                        Apical: Normal     LV masses: No Masses                             Inferior: AKINETIC           LVH: MODERATE LVH ASYMMETRIC              Posterior: HYPOCONTRACTILE  Dias.FxClass: RELAXATION ABNORMALITY (GRADE 1) CORRESPONDS TO E/A REVERSAL   MITRAL VALVE      Leaflets: Normal                  Mobility: Fully mobile    Morphology: Normal   LEFT ATRIUM          Size: Normal     LA masses: No masses                Normal IAS   PULMONIC VALVE    Morphology: Normal      Mobility: Fully Mobile   RIGHT VENTRICLE          Size: MILDLY ENLARGED  Free wall: Normal   Contraction: Normal                    RV masses: No Masses         TAPSE:   2.4 cm,  Normal Range [>= 1.6 cm]   TRICUSPID VALVE      Leaflets: Normal                  Mobility: Fully mobile    Morphology: Normal   RIGHT ATRIUM          Size: MILDLY ENLARGED            RA Other: None     RA masses: No masses   PERICARDIUM        Fluid: No effusion   INFERIOR VENACAVA          Size: Normal     Normal respiratory collapse   DOPPLER ECHO and OTHER SPECIAL PROCEDURES ------------------------------------     Aortic: MILD AR                No AS      Mitral: TRIVIAL MR             No MS     MV Inflow E Vel.= nm* cm/s  MV Annulus E'Vel.= nm* cm/s  E/E'Ratio= nm*   Tricuspid: MILD TR                No TS   Pulmonary: TRIVIAL PR             No PS       Other:   INTERPRETATION ---------------------------------------------------------------    MILD LV DYSFUNCTION (See above) WITH MODERATE LVH     NORMAL RIGHT VENTRICULAR SYSTOLIC FUNCTION    VALVULAR REGURGITATION: MILD AR, TRIVIAL MR, TRIVIAL PR, MILD TR    NO VALVULAR STENOSIS    EKG:  EKG is ordered today.  The EKG ordered today demonstrates NSR, 63 bpm, rare PACs, LVH, prior inferior MI  Recent Labs: 12/01/2023: Hemoglobin 11.4; Platelets 268.0  Recent Lipid Panel    Component Value Date/Time   CHOL 124 12/31/2021 0812   TRIG 88 12/31/2021 0812   HDL 38 (L) 12/31/2021 0812   CHOLHDL 3.3 12/31/2021 0812   CHOLHDL 4.8 10/04/2017 0935   LDLCALC 69 12/31/2021 0812   LDLCALC 101 (H) 10/04/2017 0935    PHYSICAL EXAM:    VS:  BP 100/62 (BP Location: Left Arm, Patient Position: Sitting, Cuff Size: Normal)   Pulse 62   Ht 5' 11 (1.803 m)   Wt 169 lb 12.8 oz (77 kg)   SpO2 98%   BMI 23.68 kg/m   BMI: Body mass index is 23.68 kg/m.  Physical Exam Vitals reviewed.  Constitutional:      Appearance: He is well-developed.  HENT:     Head: Normocephalic and atraumatic.   Eyes:     General:        Right eye: No discharge.        Left eye: No discharge.    Cardiovascular:     Rate and Rhythm: Normal rate and regular rhythm.     Heart sounds: Normal heart sounds, S1 normal and S2 normal. Heart sounds not distant. No midsystolic click and no opening snap. No murmur heard.    No friction rub.  Pulmonary:     Effort: Pulmonary effort is normal. No respiratory distress.     Breath sounds: Normal breath sounds. No decreased  breath sounds, wheezing, rhonchi or rales.  Chest:     Chest wall: No tenderness.   Musculoskeletal:     Cervical back: Normal range of motion.     Right lower leg: No edema.     Left lower leg: No edema.   Skin:    General: Skin is warm and dry.     Nails: There is no clubbing.   Neurological:     Mental Status: He is alert and oriented to person, place, and time.   Psychiatric:        Speech: Speech normal.        Behavior: Behavior normal.        Thought Content: Thought content  normal.        Judgment: Judgment normal.     Wt Readings from Last 3 Encounters:  03/30/24 169 lb 12.8 oz (77 kg)  01/13/24 165 lb (74.8 kg)  01/04/24 169 lb 3.2 oz (76.7 kg)     ASSESSMENT & PLAN:   CAD involving the native coronary arteries without angina: He is doing well and without symptoms concerning for angina or cardiac decompensation.  Continue aggressive risk factor modification and second prevention including aspirin 81 mg and atorvastatin  80 mg.  No indication for further ischemic testing at this time.  HTN: Blood pressure is well-controlled in the office today.  He remains on triamterene /HCTZ 37.5/25 mg daily.  Followed by PCP.  HLD: LDL 69 in 12/2022.  Remains on atorvastatin  80 mg.  Check lipid panel and LFT.     Disposition: F/u with Dr. Nolan Battle or an APP in 6 months.   Medication Adjustments/Labs and Tests Ordered: Current medicines are reviewed at length with the patient today.  Concerns regarding medicines are outlined above. Medication changes, Labs and Tests ordered today are summarized above and listed in the Patient Instructions accessible in Encounters.   Signed, Varney Gentleman, PA-C 03/30/2024 1:24 PM     [facility] [place] [address] [place], [place] [address] [phone]

## 2024-03-30 ENCOUNTER — Ambulatory Visit: Attending: Physician Assistant | Admitting: Physician Assistant

## 2024-03-30 ENCOUNTER — Encounter: Payer: Self-pay | Admitting: Physician Assistant

## 2024-03-30 VITALS — BP 100/62 | HR 62 | Ht 71.0 in | Wt 169.8 lb

## 2024-03-30 DIAGNOSIS — E785 Hyperlipidemia, unspecified: Secondary | ICD-10-CM | POA: Diagnosis not present

## 2024-03-30 DIAGNOSIS — I10 Essential (primary) hypertension: Secondary | ICD-10-CM | POA: Diagnosis not present

## 2024-03-30 DIAGNOSIS — I499 Cardiac arrhythmia, unspecified: Secondary | ICD-10-CM

## 2024-03-30 DIAGNOSIS — Z79899 Other long term (current) drug therapy: Secondary | ICD-10-CM | POA: Diagnosis not present

## 2024-03-30 DIAGNOSIS — I2510 Atherosclerotic heart disease of native coronary artery without angina pectoris: Secondary | ICD-10-CM

## 2024-03-30 NOTE — Patient Instructions (Signed)
 Medication Instructions:   Your physician recommends that you continue on your current medications as directed. Please refer to the Current Medication list given to you today.   *If you need a refill on your cardiac medications before your next appointment, please call your pharmacy*  Lab Work:  Your provider would like for you to have following labs drawn today Lipid Panel and Heptatic Panel.    If you have labs (blood work) drawn today and your tests are completely normal, you will receive your results only by: MyChart Message (if you have MyChart) OR A paper copy in the mail If you have any lab test that is abnormal or we need to change your treatment, we will call you to review the results.  Testing/Procedures:  No test ordered today   Follow-Up: At [facility], you and your health needs are our priority.  As part of our continuing mission to provide you with exceptional heart care, our providers are all part of one team.  This team includes your primary Cardiologist (physician) and Advanced Practice Providers or APPs (Physician Assistants and Nurse Practitioners) who all work together to provide you with the care you need, when you need it.  Your next appointment:   6 month(s)  Provider:   Sammy Crisp, MD or Varney Gentleman, PA-C

## 2024-03-31 ENCOUNTER — Ambulatory Visit: Payer: Self-pay | Admitting: *Deleted

## 2024-03-31 ENCOUNTER — Other Ambulatory Visit: Payer: Self-pay

## 2024-03-31 DIAGNOSIS — E785 Hyperlipidemia, unspecified: Secondary | ICD-10-CM

## 2024-03-31 LAB — LIPID PANEL
Chol/HDL Ratio: 3 ratio (ref 0.0–5.0)
Cholesterol, Total: 140 mg/dL (ref 100–199)
HDL: 47 mg/dL (ref >39)
LDL Chol Calc (NIH): 77 mg/dL (ref 0–99)
Triglycerides: 84 mg/dL (ref 0–149)
VLDL Cholesterol Cal: 16 mg/dL (ref 5–40)

## 2024-03-31 LAB — HEPATIC FUNCTION PANEL
ALT: 21 IU/L (ref 0–44)
AST: 30 IU/L (ref 0–40)
Albumin: 4.4 g/dL (ref 3.8–4.8)
Alkaline Phosphatase: 90 IU/L (ref 44–121)
Bilirubin Total: 0.5 mg/dL (ref 0.0–1.2)
Bilirubin, Direct: 0.2 mg/dL (ref 0.00–0.40)
Total Protein: 6.4 g/dL (ref 6.0–8.5)

## 2024-04-03 DIAGNOSIS — D0472 Carcinoma in situ of skin of left lower limb, including hip: Secondary | ICD-10-CM | POA: Diagnosis not present

## 2024-04-03 DIAGNOSIS — H2513 Age-related nuclear cataract, bilateral: Secondary | ICD-10-CM | POA: Diagnosis not present

## 2024-04-03 DIAGNOSIS — H0289 Other specified disorders of eyelid: Secondary | ICD-10-CM | POA: Diagnosis not present

## 2024-04-03 DIAGNOSIS — H16223 Keratoconjunctivitis sicca, not specified as Sjogren's, bilateral: Secondary | ICD-10-CM | POA: Diagnosis not present

## 2024-04-10 DIAGNOSIS — H16223 Keratoconjunctivitis sicca, not specified as Sjogren's, bilateral: Secondary | ICD-10-CM | POA: Diagnosis not present

## 2024-04-11 DIAGNOSIS — H169 Unspecified keratitis: Secondary | ICD-10-CM | POA: Diagnosis not present

## 2024-04-19 DIAGNOSIS — H169 Unspecified keratitis: Secondary | ICD-10-CM | POA: Diagnosis not present

## 2024-04-28 DIAGNOSIS — E291 Testicular hypofunction: Secondary | ICD-10-CM | POA: Diagnosis not present

## 2024-04-28 DIAGNOSIS — R5383 Other fatigue: Secondary | ICD-10-CM | POA: Diagnosis not present

## 2024-04-29 ENCOUNTER — Other Ambulatory Visit: Payer: Self-pay | Admitting: Internal Medicine

## 2024-04-29 DIAGNOSIS — K219 Gastro-esophageal reflux disease without esophagitis: Secondary | ICD-10-CM

## 2024-05-04 ENCOUNTER — Other Ambulatory Visit: Payer: Self-pay | Admitting: Internal Medicine

## 2024-05-10 DIAGNOSIS — N200 Calculus of kidney: Secondary | ICD-10-CM | POA: Diagnosis not present

## 2024-05-10 DIAGNOSIS — M25551 Pain in right hip: Secondary | ICD-10-CM | POA: Diagnosis not present

## 2024-05-10 DIAGNOSIS — M25552 Pain in left hip: Secondary | ICD-10-CM | POA: Diagnosis not present

## 2024-05-10 DIAGNOSIS — Z1331 Encounter for screening for depression: Secondary | ICD-10-CM | POA: Diagnosis not present

## 2024-05-10 DIAGNOSIS — D649 Anemia, unspecified: Secondary | ICD-10-CM | POA: Diagnosis not present

## 2024-05-10 DIAGNOSIS — Z0000 Encounter for general adult medical examination without abnormal findings: Secondary | ICD-10-CM | POA: Diagnosis not present

## 2024-05-10 DIAGNOSIS — I2510 Atherosclerotic heart disease of native coronary artery without angina pectoris: Secondary | ICD-10-CM | POA: Diagnosis not present

## 2024-05-10 DIAGNOSIS — I10 Essential (primary) hypertension: Secondary | ICD-10-CM | POA: Diagnosis not present

## 2024-05-10 DIAGNOSIS — G500 Trigeminal neuralgia: Secondary | ICD-10-CM | POA: Diagnosis not present

## 2024-05-10 DIAGNOSIS — F325 Major depressive disorder, single episode, in full remission: Secondary | ICD-10-CM | POA: Diagnosis not present

## 2024-05-10 DIAGNOSIS — K2270 Barrett's esophagus without dysplasia: Secondary | ICD-10-CM | POA: Diagnosis not present

## 2024-05-22 NOTE — Telephone Encounter (Signed)
 Called patient to remind him that he is due for lab work for Dr Albertus this week. Patient states he will come for these as requested.

## 2024-05-25 ENCOUNTER — Other Ambulatory Visit (INDEPENDENT_AMBULATORY_CARE_PROVIDER_SITE_OTHER)

## 2024-05-25 DIAGNOSIS — D509 Iron deficiency anemia, unspecified: Secondary | ICD-10-CM

## 2024-05-25 LAB — IBC + FERRITIN
Ferritin: 26.3 ng/mL (ref 22.0–322.0)
Iron: 87 ug/dL (ref 42–165)
Saturation Ratios: 26.2 % (ref 20.0–50.0)
TIBC: 331.8 ug/dL (ref 250.0–450.0)
Transferrin: 237 mg/dL (ref 212.0–360.0)

## 2024-05-25 LAB — CBC WITH DIFFERENTIAL/PLATELET
Basophils Absolute: 0 K/uL (ref 0.0–0.1)
Basophils Relative: 0.6 % (ref 0.0–3.0)
Eosinophils Absolute: 0.5 K/uL (ref 0.0–0.7)
Eosinophils Relative: 10.1 % — ABNORMAL HIGH (ref 0.0–5.0)
HCT: 45 % (ref 39.0–52.0)
Hemoglobin: 14.9 g/dL (ref 13.0–17.0)
Lymphocytes Relative: 28.7 % (ref 12.0–46.0)
Lymphs Abs: 1.3 K/uL (ref 0.7–4.0)
MCHC: 33 g/dL (ref 30.0–36.0)
MCV: 91.1 fl (ref 78.0–100.0)
Monocytes Absolute: 0.5 K/uL (ref 0.1–1.0)
Monocytes Relative: 11.3 % (ref 3.0–12.0)
Neutro Abs: 2.2 K/uL (ref 1.4–7.7)
Neutrophils Relative %: 49.3 % (ref 43.0–77.0)
Platelets: 217 K/uL (ref 150.0–400.0)
RBC: 4.94 Mil/uL (ref 4.22–5.81)
RDW: 15.5 % (ref 11.5–15.5)
WBC: 4.5 K/uL (ref 4.0–10.5)

## 2024-05-26 ENCOUNTER — Ambulatory Visit: Payer: Self-pay | Admitting: Internal Medicine

## 2024-07-04 ENCOUNTER — Ambulatory Visit (INDEPENDENT_AMBULATORY_CARE_PROVIDER_SITE_OTHER): Admitting: Vascular Surgery

## 2024-07-04 ENCOUNTER — Encounter (INDEPENDENT_AMBULATORY_CARE_PROVIDER_SITE_OTHER)

## 2024-07-11 ENCOUNTER — Encounter (INDEPENDENT_AMBULATORY_CARE_PROVIDER_SITE_OTHER)

## 2024-07-11 ENCOUNTER — Ambulatory Visit (INDEPENDENT_AMBULATORY_CARE_PROVIDER_SITE_OTHER): Admitting: Vascular Surgery

## 2024-07-25 ENCOUNTER — Ambulatory Visit (INDEPENDENT_AMBULATORY_CARE_PROVIDER_SITE_OTHER): Admitting: Vascular Surgery

## 2024-07-25 ENCOUNTER — Ambulatory Visit (INDEPENDENT_AMBULATORY_CARE_PROVIDER_SITE_OTHER)

## 2024-07-25 VITALS — BP 134/69 | HR 64 | Resp 16 | Ht 70.0 in | Wt 163.0 lb

## 2024-07-25 DIAGNOSIS — I2510 Atherosclerotic heart disease of native coronary artery without angina pectoris: Secondary | ICD-10-CM | POA: Diagnosis not present

## 2024-07-25 DIAGNOSIS — I10 Essential (primary) hypertension: Secondary | ICD-10-CM

## 2024-07-25 DIAGNOSIS — I6523 Occlusion and stenosis of bilateral carotid arteries: Secondary | ICD-10-CM

## 2024-07-25 DIAGNOSIS — E785 Hyperlipidemia, unspecified: Secondary | ICD-10-CM | POA: Diagnosis not present

## 2024-07-25 NOTE — Progress Notes (Signed)
 MRN : [id number]  Timothy Singleton is a 78 y.o. (10-07-1946) male who presents with chief complaint of  Chief Complaint  Patient presents with   Follow-up    Ultrasound  .  History of Present Illness: Patient returns today in follow up of his carotid disease.  He is doing well today.  He is not having any focal neurologic symptoms. Specifically, the patient denies amaurosis fugax, speech or swallowing difficulties, or arm or leg weakness or numbness.  Carotid duplex shows continued mild to moderate disease but nothing appearing to be more than 50%.  The preponderance of the disease is in the common carotid arteries bilaterally.  This has not progressed from previous study.  Current Outpatient Medications  Medication Sig Dispense Refill   aspirin 81 MG tablet Take 81 mg by mouth daily.     atorvastatin  (LIPITOR) 80 MG tablet atorvastatin  80 mg tablet  TAKE 1 TABLET BY MOUTH EVERY DAY     carbamazepine  (TEGRETOL  XR) 200 MG 12 hr tablet Take 1 tablet (200 mg total) by mouth 2 (two) times daily. 180 tablet 1   Cholecalciferol (EQL VITAMIN D3) 50 MCG (2000 UT) CAPS      famotidine  (PEPCID ) 20 MG tablet TAKE 1 TO 2 TABLETS BY MOUTH AT BEDTIME AS NEEDED 180 tablet 1   melatonin 3 MG TABS tablet Take 3 mg by mouth at bedtime.     METAMUCIL FIBER PO Take by mouth.     pantoprazole  (PROTONIX ) 40 MG tablet TAKE 1 TABLET (40 MG TOTAL) BY MOUTH TWICE A DAY BEFORE MEALS 60 tablet 3   sertraline  (ZOLOFT ) 100 MG tablet TAKE 1 TABLET BY MOUTH EVERY DAY 90 tablet 3   triamterene -hydrochlorothiazide (DYAZIDE) 37.5-25 MG capsule Take 1 each (1 capsule total) by mouth daily. 90 capsule 3   anastrozole (ARIMIDEX) 1 MG tablet Take 1 mg by mouth every 14 (fourteen) days. (Patient not taking: Reported on 07/25/2024)     Current Facility-Administered Medications  Medication Dose Route Frequency Provider Last Rate Last Admin   0.9 %  sodium chloride  infusion  500 mL Intravenous Once Pyrtle, Gordy HERO, MD         Past Medical History:  Diagnosis Date   Arthritis    Cancer (HCC)    melanoma / knee   Depression    Diverticulosis    GERD (gastroesophageal reflux disease)    Hyperlipemia    Hypertension    Internal hemorrhoids    Myocardial infarction [facility]) 2016   2 stents placed   Past heart attack    Sleep apnea    Trigeminal neuralgia     Past Surgical History:  Procedure Laterality Date   BUNIONECTOMY     COLONOSCOPY WITH PROPOFOL  N/A 11/17/2016   Procedure: COLONOSCOPY WITH PROPOFOL ;  Surgeon: Ruel Kung, MD;  Location: [facility] ENDOSCOPY;  Service: Endoscopy;  Laterality: N/A;   CORONARY STENT PLACEMENT     EXTRACORPOREAL SHOCK WAVE LITHOTRIPSY     EXTRACORPOREAL SHOCK WAVE LITHOTRIPSY Right 12/30/2017   Procedure: EXTRACORPOREAL SHOCK WAVE LITHOTRIPSY (ESWL);  Surgeon: Penne Knee, MD;  Location: [facility] ORS;  Service: Urology;  Laterality: Right;   EXTRACORPOREAL SHOCK WAVE LITHOTRIPSY Right 06/20/2020   Procedure: EXTRACORPOREAL SHOCK WAVE LITHOTRIPSY (ESWL);  Surgeon: Penne Knee, MD;  Location: [facility] ORS;  Service: Urology;  Laterality: Right;   HERNIA REPAIR     inguinal-right   JOINT REPLACEMENT     total shoulder replacement   KNEE SURGERY Right    TONSILLECTOMY  TOTAL SHOULDER REPLACEMENT     UPPER GASTROINTESTINAL ENDOSCOPY     UPPER GI ENDOSCOPY  10/18/2001   hiatus hernia   VASECTOMY     WRIST SURGERY       Social History   Tobacco Use   Smoking status: Former    Current packs/day: 1.00    Average packs/day: 1 pack/day for 61.8 years (61.8 ttl pk-yrs)    Types: Cigarettes    Start date: 1964   Smokeless tobacco: Former  Building services engineer status: Never Used  Substance Use Topics   Alcohol use: Not Currently    Alcohol/week: 6.0 standard drinks of alcohol    Types: 4 Cans of beer, 2 Standard drinks or equivalent per week   Drug use: No      Family History  Problem Relation Age of Onset   Cancer Mother    Dementia Mother    Heart attack  Father    Stroke Father    Heart disease Father    Hypertension Father    Breast cancer Sister    Parkinson's disease Brother    Hypertension Brother    Melanoma Maternal Grandmother    Bladder Cancer Neg Hx    Prostate cancer Neg Hx    Kidney cancer Neg Hx    Colon cancer Neg Hx    Rectal cancer Neg Hx    Stomach cancer Neg Hx    Esophageal cancer Neg Hx    Colon polyps Neg Hx      No Known Allergies    REVIEW OF SYSTEMS (Negative unless checked)   Constitutional: [] Weight loss  [] Fever  [] Chills Cardiac: [] Chest pain   [] Chest pressure   [] Palpitations   [] Shortness of breath when laying flat   [] Shortness of breath at rest   [x] Shortness of breath with exertion. Vascular:  [] Pain in legs with walking   [] Pain in legs at rest   [] Pain in legs when laying flat   [] Claudication   [] Pain in feet when walking  [] Pain in feet at rest  [] Pain in feet when laying flat   [] History of DVT   [] Phlebitis   [] Swelling in legs   [] Varicose veins   [] Non-healing ulcers Pulmonary:   [] Uses home oxygen   [] Productive cough   [] Hemoptysis   [] Wheeze  [] COPD   [] Asthma Neurologic:  [x] Dizziness  [] Blackouts   [] Seizures   [] History of stroke   [] History of TIA  [x] Aphasia   [] Temporary blindness   [] Dysphagia   [] Weakness or numbness in arms   [] Weakness or numbness in legs Musculoskeletal:  [x] Arthritis   [] Joint swelling   [] Joint pain   [] Low back pain Hematologic:  [] Easy bruising  [] Easy bleeding   [] Hypercoagulable state   [] Anemic  [] Hepatitis Gastrointestinal:  [] Blood in stool   [] Vomiting blood  [x] Gastroesophageal reflux/heartburn   [] Abdominal pain Genitourinary:  [] Chronic kidney disease   [] Difficult urination  [] Frequent urination  [] Burning with urination   [] Hematuria Skin:  [] Rashes   [] Ulcers   [] Wounds Psychological:  [] History of anxiety   [x]  History of major depression.  Physical Examination  BP 134/69 (BP Location: Left Arm)   Pulse 64   Resp 16   Ht 5' 10 (1.778 m)    Wt 163 lb (73.9 kg)   BMI 23.39 kg/m  Gen:  WD/WN, NAD. Appears younger than stated age. Head: [place]/AT, No temporalis wasting. Ear/Nose/Throat: Hearing grossly intact, nares w/o erythema or drainage Eyes: Conjunctiva clear. Sclera non-icteric Neck: Supple.  Trachea midline Pulmonary:  Good air movement, no use of accessory muscles.  Cardiac: RRR, no JVD Vascular:  Vessel Right Left  Radial Palpable Palpable               Musculoskeletal: M/S 5/5 throughout.  No deformity or atrophy. No edema. Neurologic: Sensation grossly intact in extremities.  Symmetrical.  Speech is fluent.  Psychiatric: Judgment intact, Mood & affect appropriate for pt's clinical situation. Dermatologic: No rashes or ulcers noted.  No cellulitis or open wounds.      Labs Recent Results (from the past 2160 hours)  IBC + Ferritin     Status: None   Collection Time: 05/25/24 10:37 AM  Result Value Ref Range   Iron 87 42 - 165 ug/dL   Transferrin 762.9 787.9 - 360.0 mg/dL   Saturation Ratios 73.7 20.0 - 50.0 %   Ferritin 26.3 22.0 - 322.0 ng/mL   TIBC 331.8 250.0 - 450.0 mcg/dL  CBC with Differential/Platelet     Status: Abnormal   Collection Time: 05/25/24 10:37 AM  Result Value Ref Range   WBC 4.5 4.0 - 10.5 K/uL   RBC 4.94 4.22 - 5.81 Mil/uL   Hemoglobin 14.9 13.0 - 17.0 g/dL   HCT 54.9 60.9 - 47.9 %   MCV 91.1 78.0 - 100.0 fl   MCHC 33.0 30.0 - 36.0 g/dL   RDW 84.4 88.4 - 84.4 %   Platelets 217.0 150.0 - 400.0 K/uL   Neutrophils Relative % 49.3 43.0 - 77.0 %   Lymphocytes Relative 28.7 12.0 - 46.0 %   Monocytes Relative 11.3 3.0 - 12.0 %   Eosinophils Relative 10.1 (H) 0.0 - 5.0 %   Basophils Relative 0.6 0.0 - 3.0 %   Neutro Abs 2.2 1.4 - 7.7 K/uL   Lymphs Abs 1.3 0.7 - 4.0 K/uL   Monocytes Absolute 0.5 0.1 - 1.0 K/uL   Eosinophils Absolute 0.5 0.0 - 0.7 K/uL   Basophils Absolute 0.0 0.0 - 0.1 K/uL    Radiology No results found.  Assessment/Plan  Carotid stenosis Carotid duplex  shows continued mild to moderate disease but nothing appearing to be more than 50%.  The preponderance of the disease is in the common carotid arteries bilaterally.  This has not progressed from previous study. Continue aspirin and statin agent.  Continue to follow on 47-month intervals with duplex.  Essential (primary) hypertension blood pressure control important in reducing the progression of atherosclerotic disease. On appropriate oral medications.     Combined fat and carbohydrate induced hyperlipemia lipid control important in reducing the progression of atherosclerotic disease. Continue statin therapy     CAD in native artery If we end up doing surgery, we will ask his cardiologist to assess his surgical risk. No need for surgery at this time.  Selinda Gu, MD  07/25/2024 2:03 PM    This note was created with Dragon medical transcription system.  Any errors from dictation are purely unintentional

## 2024-07-25 NOTE — Assessment & Plan Note (Signed)
 Carotid duplex shows continued mild to moderate disease but nothing appearing to be more than 50%.  The preponderance of the disease is in the common carotid arteries bilaterally.  This has not progressed from previous study. Continue aspirin and statin agent.  Continue to follow on 51-month intervals with duplex.

## 2024-07-29 ENCOUNTER — Other Ambulatory Visit: Payer: Self-pay | Admitting: Internal Medicine

## 2024-07-29 DIAGNOSIS — K219 Gastro-esophageal reflux disease without esophagitis: Secondary | ICD-10-CM

## 2024-09-04 DIAGNOSIS — D2262 Melanocytic nevi of left upper limb, including shoulder: Secondary | ICD-10-CM | POA: Diagnosis not present

## 2024-09-04 DIAGNOSIS — Z85828 Personal history of other malignant neoplasm of skin: Secondary | ICD-10-CM | POA: Diagnosis not present

## 2024-09-04 DIAGNOSIS — D2261 Melanocytic nevi of right upper limb, including shoulder: Secondary | ICD-10-CM | POA: Diagnosis not present

## 2024-09-04 DIAGNOSIS — D2272 Melanocytic nevi of left lower limb, including hip: Secondary | ICD-10-CM | POA: Diagnosis not present

## 2024-09-04 DIAGNOSIS — D225 Melanocytic nevi of trunk: Secondary | ICD-10-CM | POA: Diagnosis not present

## 2024-09-04 DIAGNOSIS — L570 Actinic keratosis: Secondary | ICD-10-CM | POA: Diagnosis not present

## 2024-09-04 DIAGNOSIS — Z85820 Personal history of malignant melanoma of skin: Secondary | ICD-10-CM | POA: Diagnosis not present

## 2024-09-26 ENCOUNTER — Encounter: Payer: Self-pay | Admitting: Physician Assistant

## 2024-09-26 ENCOUNTER — Ambulatory Visit: Attending: Physician Assistant | Admitting: Physician Assistant

## 2024-09-26 VITALS — BP 138/68 | HR 59 | Ht 71.0 in | Wt 166.2 lb

## 2024-09-26 DIAGNOSIS — I2510 Atherosclerotic heart disease of native coronary artery without angina pectoris: Secondary | ICD-10-CM

## 2024-09-26 DIAGNOSIS — I6523 Occlusion and stenosis of bilateral carotid arteries: Secondary | ICD-10-CM | POA: Diagnosis not present

## 2024-09-26 DIAGNOSIS — I10 Essential (primary) hypertension: Secondary | ICD-10-CM | POA: Diagnosis not present

## 2024-09-26 DIAGNOSIS — E785 Hyperlipidemia, unspecified: Secondary | ICD-10-CM | POA: Diagnosis not present

## 2024-09-26 NOTE — Progress Notes (Signed)
 Cardiology Office Note    Date:  09/26/2024   ID:  Timothy Singleton, DOB 1946-09-11, MRN [id number]  PCP:  Bertrum Charlie CROME, MD  Cardiologist:  Lonni Hanson, MD  Electrophysiologist:  None   Chief Complaint: Follow up  History of Present Illness:   Timothy Singleton is a 77 y.o. male with history of ski. He had inferior STEMI in 2015 with PCI/DES placement to the distaCAD with STEMI s/p PCI to RCA in 07/2014, TIAs, hypertension, hyperlipidemia, trigeminal neuralgia, renal artery disease, and GERD who is being seen today for follow-up of CAD.   He was previously followed by [facility] clinic.  Exercise MPI 05/2015 showed abnormal study with moderate-sized, mild in intensity, fixed inferior defect consistent with scar.  No significant ischemia noted.  EF 51% with inferior wall hypokinesis.  He has not required ischemic testing since.  He was last seen in the office in 03/2024 and was doing well from a cardiac perspective, remaining active at baseline without cardiac limitation.  No changes in cardiac pharmacotherapy were indicated at that time.  Carotid artery ultrasound 07/2024 showed 1 to 39% bilateral ICA stenosis with anterograde flow of the bilateral vertebral arteries and normal flow hemodynamics of the bilateral subclavian arteries.  He comes in doing very well from a cardiac perspective and remains without symptoms of angina or cardiac decompensation.  No palpitations, dizziness, presyncope, or syncope.  No falls or symptoms concerning for bleeding.  Adherent and tolerating cardiac pharmacotherapy without off target effect.  Remains active at baseline.  No lower extremity swelling.  Does not have any acute cardiac concerns at this time.   Labs independently reviewed: 05/2024 - Hgb 14.9, PLT 217 03/2024 - TC 140, TG 84, HDL 47, LDL 77, albumin 4.4, AST/ALT normal 10/2023 - potassium 4.4, BUN 21, SCr 1.24   Past Medical History:  Diagnosis Date   Arthritis    Cancer (HCC)     melanoma / knee   Depression    Diverticulosis    GERD (gastroesophageal reflux disease)    Hyperlipemia    Hypertension    Internal hemorrhoids    Myocardial infarction (HCC) 2016   2 stents placed   Past heart attack    Sleep apnea    Trigeminal neuralgia     Past Surgical History:  Procedure Laterality Date   BUNIONECTOMY     COLONOSCOPY WITH PROPOFOL  N/A 11/17/2016   Procedure: COLONOSCOPY WITH PROPOFOL ;  Surgeon: Ruel Kung, MD;  Location: [facility];  Service: Endoscopy;  Laterality: N/A;   CORONARY STENT PLACEMENT     EXTRACORPOREAL SHOCK WAVE LITHOTRIPSY     EXTRACORPOREAL SHOCK WAVE LITHOTRIPSY Right 12/30/2017   Procedure: EXTRACORPOREAL SHOCK WAVE LITHOTRIPSY (ESWL);  Surgeon: Penne Knee, MD;  Location: [facility] ORS;  Service: Urology;  Laterality: Right;   EXTRACORPOREAL SHOCK WAVE LITHOTRIPSY Right 06/20/2020   Procedure: EXTRACORPOREAL SHOCK WAVE LITHOTRIPSY (ESWL);  Surgeon: Penne Knee, MD;  Location: [facility] ORS;  Service: Urology;  Laterality: Right;   HERNIA REPAIR     inguinal-right   JOINT REPLACEMENT     total shoulder replacement   KNEE SURGERY Right    TONSILLECTOMY     TOTAL SHOULDER REPLACEMENT     UPPER GASTROINTESTINAL ENDOSCOPY     UPPER GI ENDOSCOPY  10/18/2001   hiatus hernia   VASECTOMY     WRIST SURGERY      Current Medications: Current Meds  Medication Sig   aspirin 81 MG tablet Take 81 mg by mouth daily.  atorvastatin  (LIPITOR) 80 MG tablet atorvastatin  80 mg tablet  TAKE 1 TABLET BY MOUTH EVERY DAY   carbamazepine  (TEGRETOL  XR) 200 MG 12 hr tablet Take 1 tablet (200 mg total) by mouth 2 (two) times daily.   Cholecalciferol (EQL VITAMIN D3) 50 MCG (2000 UT) CAPS    famotidine  (PEPCID ) 20 MG tablet TAKE 1 TO 2 TABLETS BY MOUTH AT BEDTIME AS NEEDED   melatonin 3 MG TABS tablet Take 3 mg by mouth at bedtime.   METAMUCIL FIBER PO Take by mouth.   pantoprazole  (PROTONIX ) 40 MG tablet TAKE 1 TABLET BY MOUTH TWICE A DAY BEFORE  MEALS-NEED TO SCHEDULE APPT FOR FURTHER REFILLS   sertraline  (ZOLOFT ) 100 MG tablet TAKE 1 TABLET BY MOUTH EVERY DAY   triamterene -hydrochlorothiazide (DYAZIDE) 37.5-25 MG capsule Take 1 each (1 capsule total) by mouth daily.   Current Facility-Administered Medications for the 09/26/24 encounter (Office Visit) with Abigail Bernardino HERO, PA-C  Medication   0.9 %  sodium chloride  infusion    Allergies:   Patient has no known allergies.   Social History   Socioeconomic History   Marital status: Married    Spouse name: Not on file   Number of children: 2   Years of education: Not on file   Highest education level: Bachelor's degree (e.g., BA, AB, BS)  Occupational History   Occupation: retired  Tobacco Use   Smoking status: Former    Current packs/day: 1.00    Average packs/day: 1 pack/day for 61.9 years (61.9 ttl pk-yrs)    Types: Cigarettes    Start date: 1964   Smokeless tobacco: Former  Building Services Engineer status: Never Used  Substance and Sexual Activity   Alcohol use: Not Currently    Alcohol/week: 6.0 standard drinks of alcohol    Types: 4 Cans of beer, 2 Standard drinks or equivalent per week   Drug use: No   Sexual activity: Not on file  Other Topics Concern   Not on file  Social History Narrative   Not on file   Social Drivers of Health   Financial Resource Strain: Low Risk  (05/10/2024)   Received from [facility]   Overall Financial Resource Strain (CARDIA)    Difficulty of Paying Living Expenses: Not hard at all  Food Insecurity: No Food Insecurity (05/10/2024)   Received from [facility]   Hunger Vital Sign    Within the past 12 months, you worried that your food would run out before you got the money to buy more.: Never true    Within the past 12 months, the food you bought just didn't last and you didn't have money to get more.: Never true  Transportation Needs: No Transportation Needs (05/10/2024)   Received from [facility] - Transportation    In the past 12 months, has lack of transportation kept you from medical appointments or from getting medications?: No    Lack of Transportation (Non-Medical): No  Physical Activity: Insufficiently Active (07/01/2023)   Received from [facility]   Exercise Vital Sign    On average, how many days per week do you engage in moderate to strenuous exercise (like a brisk walk)?: 3 days    On average, how many minutes do you engage in exercise at this level?: 30 min  Stress: No Stress Concern Present (11/17/2021)   [employer] of [employer]ational [employer] - Occupational Stress Questionnaire    Feeling of  Stress : Not at all  Social Connections: Moderately Integrated (11/17/2021)   Social Connection and Isolation Panel    Frequency of Communication with Friends and Family: More than three times a week    Frequency of Social Gatherings with Friends and Family: Twice a week    Attends Religious Services: More than 4 times per year    Active Member of [employer] or Organizations: No    Attends Engineer, Structural: Never    Marital Status: Married     Family History:  The patient's family history includes Breast cancer in his sister; Cancer in his mother; Dementia in his mother; Heart attack in his father; Heart disease in his father; Hypertension in his brother and father; Melanoma in his maternal grandmother; Parkinson's disease in his brother; Stroke in his father. There is no history of Bladder Cancer, Prostate cancer, Kidney cancer, Colon cancer, Rectal cancer, Stomach cancer, Esophageal cancer, or Colon polyps.  ROS:   12-point review of systems is negative unless otherwise noted in the HPI.   EKGs/Labs/Other Studies Reviewed:    Studies reviewed were summarized above. The additional studies were reviewed today:  Treadmill MPI 05/23/2015 [facility]): EF 51% Perfusion Analysis:  SPECT images demonstrate moderate  perfusion  abnormality of mild intensity is present in the inferior myocardial region  on the stress images.  As well as at rest most consistent with previous  myocardial infarction without evidence of myocardial ischemia. __________   2D echo 08/13/2014 ([facility]): ECHOCARDIOGRAPHIC DESCRIPTIONS -----------------------------------------------  AORTIC ROOT          Size: DILATED    Dissection: INDETERM FOR DISSECTION   AORTIC VALVE      Leaflets: Tricuspid             Morphology: Normal      Mobility: Fully Mobile   LEFT VENTRICLE                                      Anterior: Normal          Size: Normal                                 Lateral: Normal   Contraction: REGIONALLY IMPAIRED                     Septal: AKINETIC    Closest EF: 45%  (Estimated)                        Apical: Normal     LV masses: No Masses                             Inferior: AKINETIC           LVH: MODERATE LVH ASYMMETRIC              Posterior: HYPOCONTRACTILE  Dias.FxClass: RELAXATION ABNORMALITY (GRADE 1) CORRESPONDS TO E/A REVERSAL   MITRAL VALVE      Leaflets: Normal                  Mobility: Fully mobile    Morphology: Normal   LEFT ATRIUM          Size: Normal     LA masses: No masses  Normal IAS   PULMONIC VALVE    Morphology: Normal      Mobility: Fully Mobile   RIGHT VENTRICLE          Size: MILDLY ENLARGED           Free wall: Normal   Contraction: Normal                    RV masses: No Masses         TAPSE:   2.4 cm,  Normal Range [>= 1.6 cm]   TRICUSPID VALVE      Leaflets: Normal                  Mobility: Fully mobile    Morphology: Normal   RIGHT ATRIUM          Size: MILDLY ENLARGED            RA Other: None     RA masses: No masses   PERICARDIUM        Fluid: No effusion   INFERIOR VENACAVA          Size: Normal     Normal respiratory collapse   DOPPLER ECHO and OTHER SPECIAL PROCEDURES ------------------------------------     Aortic: MILD AR                 No AS      Mitral: TRIVIAL MR             No MS     MV Inflow E Vel.= nm* cm/s  MV Annulus E'Vel.= nm* cm/s  E/E'Ratio= nm*   Tricuspid: MILD TR                No TS   Pulmonary: TRIVIAL PR             No PS       Other:   INTERPRETATION ---------------------------------------------------------------    MILD LV DYSFUNCTION (See above) WITH MODERATE LVH    NORMAL RIGHT VENTRICULAR SYSTOLIC FUNCTION    VALVULAR REGURGITATION: MILD AR, TRIVIAL MR, TRIVIAL PR, MILD TR    NO VALVULAR STENOSIS    EKG:  EKG is ordered today.  The EKG ordered today demonstrates sinus bradycardia, LVH, prior inferior infarct, consistent with prior tracings  Recent Labs: 03/30/2024: ALT 21 05/25/2024: Hemoglobin 14.9; Platelets 217.0  Recent Lipid Panel    Component Value Date/Time   CHOL 140 03/30/2024 0933   TRIG 84 03/30/2024 0933   HDL 47 03/30/2024 0933   CHOLHDL 3.0 03/30/2024 0933   CHOLHDL 4.8 10/04/2017 0935   LDLCALC 77 03/30/2024 0933   LDLCALC 101 (H) 10/04/2017 0935    PHYSICAL EXAM:    VS:  BP 138/68 (BP Location: Left Arm, Patient Position: Sitting, Cuff Size: Normal)   Pulse (!) 59 Comment: 65 oximeter  Ht 5' 11 (1.803 m)   Wt 166 lb 3.2 oz (75.4 kg)   SpO2 98%   BMI 23.18 kg/m   BMI: Body mass index is 23.18 kg/m.  Physical Exam Vitals reviewed.  Constitutional:      Appearance: He is well-developed.  HENT:     Head: Normocephalic and atraumatic.  Eyes:     General:        Right eye: No discharge.        Left eye: No discharge.  Cardiovascular:     Rate and Rhythm: Normal rate and regular rhythm.     Pulses:  Posterior tibial pulses are 2+ on the right side and 2+ on the left side.     Heart sounds: Normal heart sounds, S1 normal and S2 normal. Heart sounds not distant. No midsystolic click and no opening snap. No murmur heard.    No friction rub.  Pulmonary:     Effort: Pulmonary effort is normal. No respiratory distress.     Breath sounds: Normal  breath sounds. No decreased breath sounds, wheezing, rhonchi or rales.  Musculoskeletal:     Cervical back: Normal range of motion.     Right lower leg: No edema.     Left lower leg: No edema.  Skin:    General: Skin is warm and dry.     Nails: There is no clubbing.  Neurological:     Mental Status: He is alert and oriented to person, place, and time.  Psychiatric:        Speech: Speech normal.        Behavior: Behavior normal.        Thought Content: Thought content normal.        Judgment: Judgment normal.     Wt Readings from Last 3 Encounters:  09/26/24 166 lb 3.2 oz (75.4 kg)  07/25/24 163 lb (73.9 kg)  03/30/24 169 lb 12.8 oz (77 kg)     ASSESSMENT & PLAN:   CAD involving the native coronary arteries without angina: He is doing well and without symptoms concerning for angina or cardiac decompensation.  Continue aggressive risk factor modification and secondary prevention including aspirin 81 mg and atorvastatin  80 mg.  No indication for ischemic testing at this time.  HTN: Blood pressure is reasonably controlled in the office today.  He remains on triamterene /HCTZ 37.5/25 mg.  HLD: LDL 77 in 03/2024.  Remains on atorvastatin  80 mg.  Heart healthy diet encouraged.  Anticipate follow-up labs at next visit if not recently obtained.  Carotid artery stenosis: Ultrasound from 07/2024 showed 1 to 39% bilateral ICA stenosis with antegrade flow of the bilateral vertebral arteries hypodynamic's at the bilateral subclavian arteries.  Followed by vascular surgery.  Remains on aspirin 81 mg and atorvastatin  80 mg.    Disposition: F/u with Dr. Mady or an APP in 5 months.   Medication Adjustments/Labs and Tests Ordered: Current medicines are reviewed at length with the patient today.  Concerns regarding medicines are outlined above. Medication changes, Labs and Tests ordered today are summarized above and listed in the Patient Instructions accessible in Encounters.   Signed, Bernardino Bring, PA-C 09/26/2024 2:03 PM     [facility] [place] [address] [place], [place] [address] [phone]

## 2024-09-26 NOTE — Patient Instructions (Signed)
 Medication Instructions:  Your physician recommends that you continue on your current medications as directed. Please refer to the Current Medication list given to you today.   *If you need a refill on your cardiac medications before your next appointment, please call your pharmacy*  Lab Work: None ordered at this time   Follow-Up: At [facility], you and your health needs are our priority.  As part of our continuing mission to provide you with exceptional heart care, our providers are all part of one team.  This team includes your primary Cardiologist (physician) and Advanced Practice Providers or APPs (Physician Assistants and Nurse Practitioners) who all work together to provide you with the care you need, when you need it.  Your next appointment:   5 month(s) Early May  Provider:   You may see Lonni Hanson, MD or Bernardino Bring, PA-C  We recommend signing up for the patient portal called MyChart.  Sign up information is provided on this After Visit Summary.  MyChart is used to connect with patients for Virtual Visits (Telemedicine).  Patients are able to view lab/test results, encounter notes, upcoming appointments, etc.  Non-urgent messages can be sent to your provider as well.   To learn more about what you can do with MyChart, go to [email or web address].

## 2024-10-20 ENCOUNTER — Other Ambulatory Visit: Payer: Self-pay | Admitting: Internal Medicine

## 2024-10-23 DIAGNOSIS — K219 Gastro-esophageal reflux disease without esophagitis: Secondary | ICD-10-CM

## 2024-11-22 ENCOUNTER — Encounter: Payer: Self-pay | Admitting: Internal Medicine

## 2024-12-05 ENCOUNTER — Ambulatory Visit: Admitting: Internal Medicine

## 2024-12-12 ENCOUNTER — Ambulatory Visit: Admitting: Internal Medicine

## 2025-01-23 ENCOUNTER — Encounter (INDEPENDENT_AMBULATORY_CARE_PROVIDER_SITE_OTHER)

## 2025-01-23 ENCOUNTER — Ambulatory Visit (INDEPENDENT_AMBULATORY_CARE_PROVIDER_SITE_OTHER): Admitting: Vascular Surgery

## 2025-02-27 ENCOUNTER — Ambulatory Visit: Admitting: Physician Assistant
